# Patient Record
Sex: Female | Born: 1945 | Race: White | Hispanic: No | Marital: Married | Smoking: Never smoker
Health system: Southern US, Community
[De-identification: ages and names within clinical notes are randomized; demographics above are authoritative.]

## PROBLEM LIST (undated history)

## (undated) DIAGNOSIS — E039 Hypothyroidism, unspecified: Secondary | ICD-10-CM

## (undated) DIAGNOSIS — I10 Essential (primary) hypertension: Secondary | ICD-10-CM

## (undated) DIAGNOSIS — I471 Supraventricular tachycardia, unspecified: Secondary | ICD-10-CM

## (undated) DIAGNOSIS — R51 Headache: Secondary | ICD-10-CM

## (undated) DIAGNOSIS — J189 Pneumonia, unspecified organism: Secondary | ICD-10-CM

## (undated) DIAGNOSIS — M1990 Unspecified osteoarthritis, unspecified site: Secondary | ICD-10-CM

## (undated) DIAGNOSIS — E119 Type 2 diabetes mellitus without complications: Secondary | ICD-10-CM

## (undated) HISTORY — PX: CATARACT EXTRACTION: SUR2

## (undated) HISTORY — PX: TONSILLECTOMY: SUR1361

## (undated) HISTORY — PX: CHOLECYSTECTOMY: SHX55

---

## 1969-12-14 HISTORY — PX: TUBAL LIGATION: SHX77

## 2000-12-14 DIAGNOSIS — E1142 Type 2 diabetes mellitus with diabetic polyneuropathy: Secondary | ICD-10-CM | POA: Insufficient documentation

## 2011-12-17 ENCOUNTER — Other Ambulatory Visit: Payer: Self-pay

## 2011-12-17 ENCOUNTER — Emergency Department (INDEPENDENT_AMBULATORY_CARE_PROVIDER_SITE_OTHER): Payer: BC Managed Care – PPO

## 2011-12-17 ENCOUNTER — Encounter: Payer: Self-pay | Admitting: *Deleted

## 2011-12-17 ENCOUNTER — Emergency Department (HOSPITAL_BASED_OUTPATIENT_CLINIC_OR_DEPARTMENT_OTHER)
Admission: EM | Admit: 2011-12-17 | Discharge: 2011-12-17 | Disposition: A | Discharge status: Home / Self Care | Payer: BC Managed Care – PPO | Attending: Emergency Medicine | Admitting: Emergency Medicine

## 2011-12-17 DIAGNOSIS — I2510 Atherosclerotic heart disease of native coronary artery without angina pectoris: Secondary | ICD-10-CM | POA: Insufficient documentation

## 2011-12-17 DIAGNOSIS — I498 Other specified cardiac arrhythmias: Secondary | ICD-10-CM

## 2011-12-17 DIAGNOSIS — R002 Palpitations: Secondary | ICD-10-CM | POA: Insufficient documentation

## 2011-12-17 DIAGNOSIS — E119 Type 2 diabetes mellitus without complications: Secondary | ICD-10-CM | POA: Insufficient documentation

## 2011-12-17 DIAGNOSIS — I471 Supraventricular tachycardia: Secondary | ICD-10-CM

## 2011-12-17 LAB — COMPREHENSIVE METABOLIC PANEL
Albumin: 4.5 g/dL (ref 3.5–5.2)
BUN: 13 mg/dL (ref 6–23)
CO2: 25 mEq/L (ref 19–32)
Calcium: 10 mg/dL (ref 8.4–10.5)
Chloride: 96 mEq/L (ref 96–112)
Creatinine, Ser: 0.5 mg/dL (ref 0.50–1.10)
GFR calc non Af Amer: >90 mL/min (ref >90)
Total Bilirubin: 0.6 mg/dL (ref 0.3–1.2)

## 2011-12-17 LAB — URINALYSIS, ROUTINE W REFLEX MICROSCOPIC
Bilirubin Urine: NEGATIVE
Hgb urine dipstick: NEGATIVE
Protein, ur: NEGATIVE mg/dL
Urobilinogen, UA: 0.2 mg/dL (ref 0.0–1.0)

## 2011-12-17 LAB — CBC
HCT: 45.3 % (ref 36.0–46.0)
Hemoglobin: 16.2 g/dL — ABNORMAL HIGH (ref 12.0–15.0)
MCHC: 35.8 g/dL (ref 30.0–36.0)
MCV: 87.5 fL (ref 78.0–100.0)
RDW: 12.6 % (ref 11.5–15.5)

## 2011-12-17 LAB — DIFFERENTIAL
Basophils Relative: 0 % (ref 0–1)
Eosinophils Relative: 2 % (ref 0–5)
Monocytes Absolute: 0.4 10*3/uL (ref 0.1–1.0)
Monocytes Relative: 6 % (ref 3–12)
Neutro Abs: 4 10*3/uL (ref 1.7–7.7)

## 2011-12-17 LAB — PRO B NATRIURETIC PEPTIDE: Pro B Natriuretic peptide (BNP): <5 pg/mL (ref 0–125)

## 2011-12-17 LAB — GLUCOSE, CAPILLARY: Glucose-Capillary: 357 mg/dL — ABNORMAL HIGH (ref 70–99)

## 2011-12-17 LAB — TROPONIN I: Troponin I: <0.3 ng/mL (ref <0.30)

## 2011-12-17 LAB — URINE MICROSCOPIC-ADD ON

## 2011-12-17 LAB — LACTIC ACID, PLASMA: Lactic Acid, Venous: 1.9 mmol/L (ref 0.5–2.2)

## 2011-12-17 LAB — MAGNESIUM: Magnesium: 2 mg/dL (ref 1.5–2.5)

## 2011-12-17 MED ORDER — ASPIRIN 81 MG PO CHEW
CHEWABLE_TABLET | ORAL | Status: AC
  Administered 2011-12-17: 324 mg
  Filled 2011-12-17: qty 4

## 2011-12-17 MED ORDER — METOPROLOL TARTRATE 1 MG/ML IV SOLN
INTRAVENOUS | Status: AC
  Administered 2011-12-17: 5 mg via INTRAVASCULAR
  Filled 2011-12-17: qty 5

## 2011-12-17 MED ORDER — ADENOSINE 12 MG/4ML IV SOLN
12.0000 mg | Freq: Once | INTRAVENOUS | Status: AC
  Administered 2011-12-17: 12 mg via INTRAVENOUS
  Filled 2011-12-17: qty 4

## 2011-12-17 MED ORDER — ADENOSINE 6 MG/2ML IV SOLN
INTRAVENOUS | Status: AC
  Filled 2011-12-17: qty 4

## 2011-12-17 MED ORDER — ADENOSINE 6 MG/2ML IV SOLN
6.0000 mg | Freq: Once | INTRAVENOUS | Status: AC
  Administered 2011-12-17: 6 mg via INTRAVENOUS
  Filled 2011-12-17: qty 2

## 2011-12-17 MED ORDER — SODIUM CHLORIDE 0.9 % IV BOLUS (SEPSIS)
1000.0000 mL | Freq: Once | INTRAVENOUS | Status: AC
  Administered 2011-12-17: 1000 mL via INTRAVENOUS

## 2011-12-17 MED ORDER — ASPIRIN 325 MG PO TABS: 325.0000 mg | ORAL_TABLET | Freq: Once | ORAL | Status: DC

## 2011-12-17 MED ORDER — METOPROLOL SUCCINATE ER 25 MG PO TB24: 50.0000 mg | ORAL_TABLET | Freq: Every day | ORAL | Status: DC

## 2011-12-17 NOTE — ED Provider Notes (Addendum)
History     CSN: [id number]  Arrival date & time 12/17/11  1339   First MD Initiated Contact with Patient 12/17/11 1419      Chief Complaint  Patient presents with  . Tachycardia    (Consider location/radiation/quality/duration/timing/severity/associated sxs/prior treatment) HPI Patient is a 66 year old female with history of diabetes who does not followup with her regular physician. She presents today complaining of sensation of palpitations. She denies any chest pain or shortness of breath. She denies any nausea or vomiting. He should said that her symptoms began today at 1 PM. She has had this numerous times in the past but has never been evaluated for it. She last took medications for her diabetes one year ago. She denies any recent fevers, nausea, or vomiting. She's had no recent infectious symptoms including cough. Patient denies any blood in her stools or dark her stools. She has no history of anemia. Patient came in today as one of her coworkers convinced her that she should be evaluated given that her symptoms were persisting. There are no other associated or modifying factors. Past Medical History  Diagnosis Date  . Diabetes mellitus   . CAD (coronary artery disease)     No past surgical history on file.  No family history on file.  History  Substance Use Topics  . Smoking status: Not on file  . Smokeless tobacco: Not on file  . Alcohol Use:     OB History    Grav Para Term Preterm Abortions TAB SAB Ect Mult Living                  Review of Systems  Constitutional: Negative.   HENT: Negative.   Eyes: Negative.   Cardiovascular: Positive for palpitations.  Gastrointestinal: Negative.   Genitourinary: Positive for frequency.  Musculoskeletal: Negative.   Neurological: Negative.   Hematological: Negative.   Psychiatric/Behavioral: Negative.   All other systems reviewed and are negative.    Allergies  Penicillins  Home Medications  No current  outpatient prescriptions on file.  BP 116/83  Pulse 86  Temp(Src) 98.1 F (36.7 C) (Oral)  Resp 17  SpO2 98%  Physical Exam  Nursing note and vitals reviewed. Constitutional: She is oriented to person, place, and time. She appears well-developed and well-nourished. No distress.  HENT:  Head: Normocephalic and atraumatic.  Eyes: Conjunctivae and EOM are normal. Pupils are equal, round, and reactive to light.  Neck: Normal range of motion.  Cardiovascular: Regular rhythm, normal heart sounds and intact distal pulses.  Tachycardia present.  Exam reveals no gallop and no friction rub.   No murmur heard. Pulmonary/Chest: Effort normal and breath sounds normal. No respiratory distress. She has no wheezes. She has no rales.  Abdominal: Soft. Bowel sounds are normal. She exhibits no distension. There is no tenderness. There is no rebound and no guarding.  Musculoskeletal: Normal range of motion. She exhibits no edema.  Neurological: She is alert and oriented to person, place, and time. No cranial nerve deficit. She exhibits normal muscle tone. Coordination normal.  Skin: Skin is warm and dry. No rash noted.  Psychiatric: She has a normal mood and affect.    ED Course  Procedures (including critical care time)   #1 Date: 12/17/2011  Rate: 188  Rhythm: sinus tachycardia  QRS Axis: normal  Intervals: normal  ST/T Wave abnormalities: nonspecific ST changes  Conduction Disutrbances:none  Narrative Interpretation: possible slight ST elevation in aVR, 1 mm ST depression in I and aVL  Old EKG Reviewed: none available    #2 Date: 12/17/2011  Rate: 87  Rhythm: normal sinus rhythm  QRS Axis: normal  Intervals: normal  ST/T Wave abnormalities: nonspecific ST/T changes  Conduction Disutrbances:none  Narrative Interpretation: ST changes resolved and no longer apparent with slowed rate.  Old EKG Reviewed: none available and changes noted  Labs Reviewed  CBC - Abnormal; Notable for the  following:    RBC 5.18 (*)    Hemoglobin 16.2 (*)    Platelets 148 (*)    All other components within normal limits  COMPREHENSIVE METABOLIC PANEL - Abnormal; Notable for the following:    Sodium 134 (*)    Glucose, Bld 412 (*)    ALT 37 (*)    Alkaline Phosphatase 138 (*)    All other components within normal limits  URINALYSIS, ROUTINE W REFLEX MICROSCOPIC - Abnormal; Notable for the following:    Glucose, UA >1000 (*)    All other components within normal limits  GLUCOSE, CAPILLARY - Abnormal; Notable for the following:    Glucose-Capillary 398 (*)    All other components within normal limits  DIFFERENTIAL  PRO B NATRIURETIC PEPTIDE  MAGNESIUM  LACTIC ACID, PLASMA  TROPONIN I  URINE MICROSCOPIC-ADD ON  POCT CBG MONITORING  POCT CBG MONITORING  GLUCOSE, CAPILLARY   Dg Chest Port 1v Same Day  12/17/2011  *RADIOLOGY REPORT*  Clinical Data: SVT.  Sudden rapid heart rate.  Symptoms off and on for several years.  Fullness in the chest and throat.  PORTABLE CHEST - 1 VIEW SAME DAY  Comparison: None.  Findings: Cardiomediastinal silhouette is within minute normal. There are no focal consolidations or pleural effusions.  No pulmonary edema. Visualized osseous structures have a normal appearance.  IMPRESSION: Negative exam.  Original Report Authenticated By: Patterson Hammersmith, M.D.   CRITICAL CARE Performed by: Cyndra Numbers   Total critical care time: 30 minutes  Critical care time was exclusive of separately billable procedures and treating other patients.  Critical care was necessary to treat or prevent imminent or life-threatening deterioration.  Critical care was time spent personally by me on the following activities: development of treatment plan with patient and/or surrogate as well as nursing, discussions with consultants, evaluation of patient's response to treatment, examination of patient, obtaining history from patient or surrogate, ordering and performing treatments and  interventions, ordering and review of laboratory studies, ordering and review of radiographic studies, pulse oximetry and re-evaluation of patient's condition.  1. Supraventricular tachycardia       MDM  Patient was evaluated by myself. Upon arrival she was tachycardic to 188. Patient has no known history of SVT or A. fib with RVR. She had regular rhythm. Patient was maintaining her blood pressure. She was placed on monitor and tabs were placed. Patient absolutely denied any chest pain. Aspirin was given. Patient's initial EKG did have concerning appearance however this was with very rapid rate. I spoke with Dr. Sharyn Lull who was on call for cardiology. He was less concerned given the patient's rate. He did recommend slowing this rate which was our plan currently. Patient was given adenosine 6 mg followed by visiting 12 mg after vagal maneuvers failed initially. Patient's heart rate returned to though 100s with this. Laboratory workup showed a normal CBC with a renal panel remarkable only for glucose of 412. Patient did have slight elevation of her ALT and alkaline phosphatase. She denied any abdominal pain or chest pain whatsoever. Urinalysis showed glucosuria with it no  signs of urinary tract infection. Patient received a liter of normal saline IV bolus. She also received Lopressor 5 mg IV. Following this patient's glucose was down to 357. She preferred not to be started on any medications for this today. She wanted to followup with her primary care doctor. A second troponin was performed at 5:30. Patient was told that if this returned negative she can be discharged. I discussed the patient with Dr. Sharyn Lull. He will see her in clinic tomorrow. He did request that she start on Toprol 50 mg. A prescription for this was provided. Care will be signed out to Dr. Hyman Hopes at this time. If troponin is negative patient will be discharged home in good condition with instructions to followup with Dr. Sharyn Lull  tomorrow.        Cyndra Numbers, MD 12/17/11 1724  Cyndra Numbers, MD 12/18/11 [phone]

## 2011-12-17 NOTE — ED Notes (Signed)
Patient was cardioverted with adenosine.  Adenosine 6 mg given IV with rapid 10 cc ns push.  EKG monitor showed no change in HR of 180.  Repeat adenosine 12mg  given with rapid 10cc ns push.  EKG showed HR decreased to 124 and then to 85, NSR.  Patient tolerated well.  Pt was on zole monitor, dr hunt was present.  Pt states she is breathing better and does not have the pressure in her chest or jaw.

## 2011-12-17 NOTE — ED Provider Notes (Signed)
BP 116/83  Pulse 86  Temp(Src) 98.1 F (36.7 C) (Oral)  Resp 17  SpO2 98%  Assumption of care from Dr. Hedwig Morton troponin is negative. The patient appears well. Her heart rate is in the 80s. She denies chest pain, shortness of breath. She's comfortable with the plan for discharge home. Will discharge patient home to follow with Dr. Sharyn Lull as previously discussed by the patient, Dr. Alto Denver and Dr. Sharyn Lull.  Forbes Cellar, MD 12/17/11 1806

## 2011-12-17 NOTE — ED Notes (Signed)
Patient states she developed a sudden onset of a rapid heart rate.  States she has had the same symptoms off and on for several years.  States today she is having a fulfullness in her chest and throat.  States she has just ignored her rapid heart rate in the past and is "self treating " her diabetes, took herself off of insulin.

## 2011-12-17 NOTE — ED Notes (Signed)
Per Dr. Alto Denver, repeat cbg when bolus finishes and recheck troponin at 1730hrs.

## 2011-12-21 ENCOUNTER — Other Ambulatory Visit (HOSPITAL_COMMUNITY): Payer: Self-pay | Admitting: Cardiology

## 2011-12-23 ENCOUNTER — Ambulatory Visit (HOSPITAL_COMMUNITY): Payer: BC Managed Care – PPO

## 2011-12-23 ENCOUNTER — Ambulatory Visit (HOSPITAL_COMMUNITY)
Admission: RE | Admit: 2011-12-23 | Discharge: 2011-12-23 | Payer: BC Managed Care – PPO | Source: Ambulatory Visit | Attending: Cardiology | Admitting: Cardiology

## 2011-12-23 ENCOUNTER — Encounter (HOSPITAL_COMMUNITY)
Admission: RE | Admit: 2011-12-23 | Discharge: 2011-12-23 | Disposition: A | Discharge status: Home / Self Care | Payer: BC Managed Care – PPO | Source: Ambulatory Visit | Attending: Cardiology | Admitting: Cardiology

## 2011-12-23 DIAGNOSIS — I10 Essential (primary) hypertension: Secondary | ICD-10-CM | POA: Insufficient documentation

## 2011-12-23 DIAGNOSIS — E119 Type 2 diabetes mellitus without complications: Secondary | ICD-10-CM | POA: Insufficient documentation

## 2011-12-23 DIAGNOSIS — R002 Palpitations: Secondary | ICD-10-CM | POA: Insufficient documentation

## 2011-12-23 DIAGNOSIS — R9439 Abnormal result of other cardiovascular function study: Secondary | ICD-10-CM | POA: Insufficient documentation

## 2011-12-23 DIAGNOSIS — R079 Chest pain, unspecified: Secondary | ICD-10-CM | POA: Insufficient documentation

## 2011-12-23 DIAGNOSIS — I4949 Other premature depolarization: Secondary | ICD-10-CM | POA: Insufficient documentation

## 2011-12-23 DIAGNOSIS — R0602 Shortness of breath: Secondary | ICD-10-CM | POA: Insufficient documentation

## 2011-12-23 LAB — LIPID PANEL
Cholesterol: 228 mg/dL — ABNORMAL HIGH (ref 0–200)
Total CHOL/HDL Ratio: 7.4 RATIO
Triglycerides: 572 mg/dL — ABNORMAL HIGH (ref <150)
VLDL: UNDETERMINED mg/dL (ref 0–40)

## 2011-12-23 LAB — BASIC METABOLIC PANEL
BUN: 12 mg/dL (ref 6–23)
Calcium: 9.7 mg/dL (ref 8.4–10.5)
GFR calc Af Amer: >90 mL/min (ref >90)
GFR calc non Af Amer: >90 mL/min (ref >90)
Potassium: 4.2 mEq/L (ref 3.5–5.1)
Sodium: 136 mEq/L (ref 135–145)

## 2011-12-23 LAB — HEPATIC FUNCTION PANEL
AST: 19 U/L (ref 0–37)
Alkaline Phosphatase: 127 U/L — ABNORMAL HIGH (ref 39–117)
Bilirubin, Direct: 0.2 mg/dL (ref 0.0–0.3)
Total Bilirubin: 0.7 mg/dL (ref 0.3–1.2)

## 2011-12-23 MED ORDER — TECHNETIUM TC 99M TETROFOSMIN IV KIT
30.0000 | PACK | Freq: Once | INTRAVENOUS | Status: AC | PRN
  Administered 2011-12-23: 30 via INTRAVENOUS

## 2011-12-23 MED ORDER — REGADENOSON 0.4 MG/5ML IV SOLN
INTRAVENOUS | Status: AC
  Filled 2011-12-23: qty 5

## 2011-12-23 MED ORDER — ACETAMINOPHEN 325 MG PO TABS
ORAL_TABLET | ORAL | Status: AC
  Filled 2011-12-23: qty 2

## 2011-12-23 MED ORDER — REGADENOSON 0.4 MG/5ML IV SOLN
0.4000 mg | Freq: Once | INTRAVENOUS | Status: AC
  Administered 2011-12-23: 0.4 mg via INTRAVENOUS

## 2011-12-23 MED ORDER — ACETAMINOPHEN 325 MG PO TABS
650.0000 mg | ORAL_TABLET | Freq: Four times a day (QID) | ORAL | Status: AC | PRN
Start: 2011-12-23 — End: 2011-12-23
  Administered 2011-12-23: 650 mg via ORAL

## 2011-12-23 MED ORDER — TECHNETIUM TC 99M TETROFOSMIN IV KIT
10.0000 | PACK | Freq: Once | INTRAVENOUS | Status: AC | PRN
  Administered 2011-12-23: 10 via INTRAVENOUS

## 2012-11-03 ENCOUNTER — Emergency Department (HOSPITAL_COMMUNITY): Payer: BC Managed Care – PPO

## 2012-11-03 ENCOUNTER — Encounter (HOSPITAL_COMMUNITY): Payer: Self-pay | Admitting: Emergency Medicine

## 2012-11-03 ENCOUNTER — Emergency Department (HOSPITAL_COMMUNITY)
Admission: EM | Admit: 2012-11-03 | Discharge: 2012-11-03 | Disposition: A | Discharge status: Home / Self Care | Payer: BC Managed Care – PPO | Attending: Emergency Medicine | Admitting: Emergency Medicine

## 2012-11-03 DIAGNOSIS — I471 Supraventricular tachycardia, unspecified: Secondary | ICD-10-CM | POA: Insufficient documentation

## 2012-11-03 DIAGNOSIS — R112 Nausea with vomiting, unspecified: Secondary | ICD-10-CM | POA: Insufficient documentation

## 2012-11-03 DIAGNOSIS — I2510 Atherosclerotic heart disease of native coronary artery without angina pectoris: Secondary | ICD-10-CM | POA: Insufficient documentation

## 2012-11-03 DIAGNOSIS — I10 Essential (primary) hypertension: Secondary | ICD-10-CM | POA: Insufficient documentation

## 2012-11-03 DIAGNOSIS — R0602 Shortness of breath: Secondary | ICD-10-CM | POA: Insufficient documentation

## 2012-11-03 DIAGNOSIS — E119 Type 2 diabetes mellitus without complications: Secondary | ICD-10-CM | POA: Insufficient documentation

## 2012-11-03 DIAGNOSIS — R002 Palpitations: Secondary | ICD-10-CM | POA: Insufficient documentation

## 2012-11-03 HISTORY — DX: Essential (primary) hypertension: I10

## 2012-11-03 LAB — BASIC METABOLIC PANEL
CO2: 25 mEq/L (ref 19–32)
Calcium: 9.3 mg/dL (ref 8.4–10.5)
Creatinine, Ser: 0.47 mg/dL — ABNORMAL LOW (ref 0.50–1.10)
GFR calc non Af Amer: >90 mL/min (ref >90)
Glucose, Bld: 266 mg/dL — ABNORMAL HIGH (ref 70–99)
Sodium: 137 mEq/L (ref 135–145)

## 2012-11-03 LAB — PHOSPHORUS: Phosphorus: 2.7 mg/dL (ref 2.3–4.6)

## 2012-11-03 MED ORDER — METOPROLOL SUCCINATE ER 50 MG PO TB24: 50.0000 mg | ORAL_TABLET | Freq: Every day | ORAL | Status: DC

## 2012-11-03 NOTE — ED Provider Notes (Signed)
History     CSN: [id number]  Arrival date & time 11/03/12  4098   First MD Initiated Contact with Patient 11/03/12 1813      Chief Complaint  Patient presents with  . Tachycardia    (Consider location/radiation/quality/duration/timing/severity/associated sxs/prior treatment) HPI: [name] is a 66 yo CF with history of IDDM, PSVT and HTN who presents after recurrent episode of tachycardia. She was in her normal state of health until earlier this evening when she developed palpitations. This was associated with mild SOB, diaphoresis and nausea. She took her pulse and it was close to 200. EMS arrived and gave her 6 mg of adenosine with conversion to SR. At that time her symptoms including SOB, diaphoresis and nausea resolved. Upon arrival to the ED she was asymptomatic. She did have one episode of emesis enroute which was treated with Zofran.   Past Medical History  Diagnosis Date  . Diabetes mellitus   . CAD (coronary artery disease)   . Tachycardia   . Hypertension     Past Surgical History  Procedure Date  . Cholecystectomy     No family history on file.  History  Substance Use Topics  . Smoking status: Never Smoker   . Smokeless tobacco: Never Used  . Alcohol Use: No    OB History    Grav Para Term Preterm Abortions TAB SAB Ect Mult Living                  Review of Systems  Constitutional: Negative for fever, chills, activity change and appetite change.  Eyes: Negative for photophobia and visual disturbance.  Respiratory: Positive for shortness of breath. Negative for cough and chest tightness.   Cardiovascular: Positive for palpitations. Negative for chest pain.  Gastrointestinal: Positive for nausea and vomiting. Negative for abdominal pain and diarrhea.  Genitourinary: Negative for dysuria, decreased urine volume and difficulty urinating.  Musculoskeletal: Negative for myalgias and arthralgias.  Skin: Negative for pallor and rash.  Neurological: Negative  for dizziness, syncope and light-headedness.  Psychiatric/Behavioral: Negative for confusion and agitation.    Allergies  Penicillins  Home Medications   Current Outpatient Rx  Name  Route  Sig  Dispense  Refill  . ACETAMINOPHEN 500 MG PO TABS   Oral   Take 2,000 mg by mouth 2 (two) times daily as needed. For pain         . IBUPROFEN 200 MG PO TABS   Oral   Take 800 mg by mouth 2 (two) times daily as needed. For pain           BP 124/92  Pulse 90  Temp 98.5 F (36.9 C) (Oral)  Resp 17  SpO2 98%  Physical Exam  Nursing note and vitals reviewed. Constitutional: She is oriented to person, place, and time. She appears well-developed and well-nourished.  HENT:  Head: Normocephalic and atraumatic.  Eyes: EOM are normal. Pupils are equal, round, and reactive to light.  Neck: Normal range of motion. Neck supple.  Cardiovascular: Normal rate, regular rhythm and normal heart sounds.   Pulmonary/Chest: Effort normal and breath sounds normal.  Abdominal: Soft. Bowel sounds are normal.  Musculoskeletal: Normal range of motion. She exhibits no edema.  Neurological: She is alert and oriented to person, place, and time. She has normal reflexes. No cranial nerve deficit.  Skin: Skin is warm and dry.    ED Course  Procedures (including critical care time)  Labs Reviewed  BASIC METABOLIC PANEL - Abnormal; Notable for  the following:    Glucose, Bld 266 (*)     Creatinine, Ser 0.47 (*)     All other components within normal limits  MAGNESIUM  PHOSPHORUS   Dg Chest Portable 1 View  11/03/2012  *RADIOLOGY REPORT*  Clinical Data: History of PSVT, cough  PORTABLE CHEST - 1 VIEW  Comparison: 12/17/2011  Findings:  Grossly unchanged cardiac silhouette and mediastinal contours. There is unchanged mild diffuse thickening of the pulmonary interstitium.  Mild elevation of right hemidiaphragm.  No new focal airspace opacities.  No definite evidence of pulmonary edema.  No definite pleural  effusion or pneumothorax.  Unchanged bones.  IMPRESSION: Unchanged mild bronchitic change without acute cardiopulmonary disease on this AP portable examination.   Original Report Authenticated By: Tacey Ruiz, MD    No diagnosis found.  MDM  66 yo CF with history of PSVT, IDDM, HTN presents with recurrent SVT. Afebrile, vital signs stable. She is asymptomatic on arrival. Doubt ACS as she denies CP, no ongoing ischemia on EKG. Q waves noted in leads III and aVF which were present on previous EKG. No WPW, prolonged Qtc or Brugada pattern. Obtained electrolytes to evaluate for potential causes of SVT which were all normal. No urinary symptoms to warrant urinalysis. Patient's glucose 266 but she is non-complaint with insulin. CXR without evidence of acute cardiopulmonary disease. Treated with IVF's. Discussed case with Dr. Patria Mane who recommended restarting Toprol 50 mg daily and he will see her in clinic on Monday. Felt patient was stable for outpatient management as she is asymptomatic, no worrisome findings on w/u or exam, has close outpatient f/u. Return precautions to include trouble breathing, CP, recurrent episode, syncope were given. Patient and her husband in agreement with plan. Rx for Toprol given.   Reviewed imaging, labs and previous medical records and utilized in MDM  Discussed case with Dr. Jeraldine Loots.   Clinical Impression 1. Recurrent PSVT 2. Hyperglycemia        Margie Billet, MD 11/04/12 0100

## 2012-11-03 NOTE — ED Notes (Signed)
Patient from home via [facility] c/o of palpitations and tachycardia. Paramedic reports patient was in SVT rhythm on assessment.  6mg  of adenosine was given and heart rate improved to sinus tachycardia.  4mg  of Zofran was also given on the truck.  Patient denies feeling palpitations at this time.  Patient states "I feel flushed, sweaty, nauseous, and have a headache".

## 2012-11-07 NOTE — ED Provider Notes (Signed)
  I performed a history and physical examination of Sophia Cox and discussed her management with Dr. Freida Busman.  I agree with the history, physical, assessment, and plan of care, with the following exceptions: None  On my exam the patient was in no distress.  The patient's supraventricular tachycardia had resolved.  We discussed her care with her cardiologist, who advised to initiate medication.  Absent distress, recurrence of her dysrhythmia, she was discharged in stable condition.  I saw the ECG, relevant labs and studies - I agree with the interpretation.   Elyse Jarvis, MD 11/07/12 [id number]

## 2013-04-07 ENCOUNTER — Emergency Department (HOSPITAL_COMMUNITY)
Admission: EM | Admit: 2013-04-07 | Discharge: 2013-04-07 | Disposition: A | Discharge status: Home / Self Care | Payer: BC Managed Care – PPO | Attending: Emergency Medicine | Admitting: Emergency Medicine

## 2013-04-07 ENCOUNTER — Encounter (HOSPITAL_COMMUNITY): Payer: Self-pay | Admitting: *Deleted

## 2013-04-07 DIAGNOSIS — E1169 Type 2 diabetes mellitus with other specified complication: Secondary | ICD-10-CM | POA: Insufficient documentation

## 2013-04-07 DIAGNOSIS — I471 Supraventricular tachycardia, unspecified: Secondary | ICD-10-CM

## 2013-04-07 DIAGNOSIS — I498 Other specified cardiac arrhythmias: Secondary | ICD-10-CM | POA: Insufficient documentation

## 2013-04-07 DIAGNOSIS — R739 Hyperglycemia, unspecified: Secondary | ICD-10-CM

## 2013-04-07 DIAGNOSIS — Z79899 Other long term (current) drug therapy: Secondary | ICD-10-CM | POA: Insufficient documentation

## 2013-04-07 DIAGNOSIS — I10 Essential (primary) hypertension: Secondary | ICD-10-CM | POA: Insufficient documentation

## 2013-04-07 DIAGNOSIS — Z880 Allergy status to penicillin: Secondary | ICD-10-CM | POA: Insufficient documentation

## 2013-04-07 DIAGNOSIS — R0789 Other chest pain: Secondary | ICD-10-CM | POA: Insufficient documentation

## 2013-04-07 LAB — POCT I-STAT, CHEM 8
BUN: 18 mg/dL (ref 6–23)
Calcium, Ion: 1.25 mmol/L (ref 1.13–1.30)
Chloride: 103 mEq/L (ref 96–112)
Creatinine, Ser: 0.6 mg/dL (ref 0.50–1.10)
Glucose, Bld: 444 mg/dL — ABNORMAL HIGH (ref 70–99)
HCT: 47 % — ABNORMAL HIGH (ref 36.0–46.0)
Hemoglobin: 16 g/dL — ABNORMAL HIGH (ref 12.0–15.0)
Potassium: 3.7 meq/L (ref 3.5–5.1)
Sodium: 137 meq/L (ref 135–145)
TCO2: 25 mmol/L (ref 0–100)

## 2013-04-07 MED ORDER — ADENOSINE 6 MG/2ML IV SOLN
6.0000 mg | Freq: Once | INTRAVENOUS | Status: AC
  Administered 2013-04-07: 6 mg via INTRAVENOUS

## 2013-04-07 MED ORDER — ADENOSINE 6 MG/2ML IV SOLN
INTRAVENOUS | Status: AC
  Administered 2013-04-07: 6 mg via INTRAVENOUS
  Filled 2013-04-07: qty 8

## 2013-04-07 MED ORDER — METFORMIN HCL 500 MG PO TABS: 500.0000 mg | ORAL_TABLET | Freq: Every day | ORAL | Status: DC

## 2013-04-07 MED ORDER — SODIUM CHLORIDE 0.9 % IV BOLUS (SEPSIS)
1000.0000 mL | Freq: Once | INTRAVENOUS | Status: AC
  Administered 2013-04-07: 1000 mL via INTRAVENOUS

## 2013-04-07 NOTE — ED Notes (Signed)
Pt with hx of "fast hear rate" to ED c/o at 0630 this am.  HR reading 195 presently.  Pt c/o sob and blurred vision, sob and chest pain. States last time they "stopped my heart with medicine".

## 2013-04-07 NOTE — ED Notes (Signed)
Cardiologist last seen [facility] MD

## 2013-04-07 NOTE — Discharge Instructions (Signed)
Supraventricular Tachycardia °Supraventricular tachycardia (SVT) is an abnormal heart rhythm (arrhythmia) that causes the heart to beat very fast (tachycardia). This kind of fast heartbeat originates in the upper chambers of the heart (atria). SVT can cause the heart to beat greater than 100 beats per minute. SVT can have a rapid burst of heartbeats. This can start and stop suddenly without warning and is called nonsustained. SVT can also be sustained, in which the heart beats at a continuous fast rate.  °CAUSES  °There can be different causes of SVT. Some of these include: °· Heart valve problems such as mitral valve prolapse. °· An enlarged heart (hypertrophic cardiomyopathy). °· Congenital heart problems. °· Heart inflammation (pericarditis). °· Hyperthyroidism. °· Low potassium or magnesium levels. °· Caffeine. °· Drug use such as cocaine, methamphetamines, or stimulants. °· Some over-the-counter medicines such as: °· Decongestants. °· Diet medicines. °· Herbal medicines. °SYMPTOMS  °Symptoms of SVT can vary. Symptoms depend on whether the SVT is sustained or nonsustained. You may experience: °· No symptoms (asymptomatic). °· An awareness of your heart beating rapidly (palpitations). °· Shortness of breath. °· Chest pain or pressure. °If your blood pressure drops because of the SVT, you may experience: °· Fainting or near fainting. °· Weakness. °· Dizziness. °DIAGNOSIS  °Different tests can be performed to diagnose SVT, such as: °· An electrocardiogram (EKG). This is a painless test that records the electrical activity of your heart. °· Holter monitor. This is a 24 hour recording of your heart rhythm. You will be given a diary. Write down all symptoms that you have and what you were doing at the time you experienced symptoms. °· Arrhythmia monitor. This is a small device that your wear for several weeks. It records the heart rhythm when you have symptoms. °· Echocardiogram. This is an imaging test to help detect  abnormal heart structure such as congenital abnormalities, heart valve problems, or heart enlargement. °· Stress test. This test can help determine if the SVT is related to exercise. °· Electrophysiology study (EPS). This is a procedure that evaluates your heart's electrical system and can help your caregiver find the cause of your SVT. °TREATMENT  °Treatment of SVT depends on the symptoms, how often it recurs, and whether there are any underlying heart problems.  °· If symptoms are rare and no other cardiac disease is present, no treatment may be needed. °· Blood work may be done to check potassium, magnesium, and thyroid hormone levels to see if they are abnormal. If these levels are abnormal, treatment to correct the problems will occur. °Medicines °Your caregiver may use oral medicines to treat SVT. These medicines are given for long-term control of SVT. Medicines may be used alone or in combination with other treatments. These medicines work to slow nerve impulses in the heart muscle. These medicines can also be used to treat high blood pressure. Some of these medicines may include: °· Calcium channel blockers. °· Beta blockers. °· Digoxin. °Nonsurgical procedures °Nonsurgical techniques may be used if oral medicines do not work. Some examples include: °· Cardioversion. This technique uses either drugs or an electrical shock to restore a normal heart rhythm. °· Cardioversion drugs may be given through an intravenous (IV) line to help "reset" the heart rhythm. °· In electrical cardioversion, the caregiver shocks your heart to stop its beat for a split second. This helps to reset the heart to a normal rhythm. °· Ablation. This procedure is done under mild sedation. High frequency radio wave energy is used to   destroy the area of heart tissue responsible for the SVT. HOME CARE INSTRUCTIONS   Do not smoke.  Only take medicines prescribed by your caregiver. Check with your caregiver before using over-the-counter  medicines.  Check with your caregiver about how much alcohol and caffeine (coffee, tea, colas, or chocolate) you may have.  It is very important to keep all follow-up referrals and appointments in order to properly manage this problem. SEEK IMMEDIATE MEDICAL CARE IF:  You have dizziness.  You faint or nearly faint.  You have shortness of breath.  You have chest pain or pressure.  You have sudden nausea or vomiting.  You have profuse sweating.  You are concerned about how long your symptoms last.  You are concerned about the frequency of your SVT episodes. If you have the above symptoms, call your local emergency services (911 in [place] immediately. Do not drive yourself to the hospital. MAKE SURE YOU:   Understand these instructions.  Will watch your condition.  Will get help right away if you are not doing well or get worse. Document Released: 11/30/2005 Document Revised: 02/22/2012 Document Reviewed: 03/14/2009 [facility] Patient Information 2013 [place], [place].    Electrophysiology Study An electrophysiology (EP) study is a minimally invasive heart test (a test that involves a small skin incision) that evaluates the electrical conduction system of your heart.  SYMPTOMS  An EP study is done if other non-invasive heart tests (tests that do not make skin incisions) have not found the cause of unexplained symptoms such as:  Dizziness or fainting.  A fast heart beat (tachycardia).  A slow heart beat (bradycardia). BEFORE THE EP STUDY  Your caregiver will explain how an EP study is done. If you have any questions about the EP study, be sure to ask your caregiver.  Do not eat or drink before the EP study as instructed by your caregiver.  Be sure to urinate before the EP study.  Let your caregiver know about any allergies you have. This includes allergies to food, medicine or latex products.  Tell your caregiver about all prescription medicine (especially blood  thinners), over the counter and herbal medications you take.  Let your caregiver know if you have a history of bleeding problems.  If you are going home after the EP study, someone will need to drive you home and stay with you for 24 hours. Do not make major decisions for 24 hours after the EP study. EP STUDY   An EP study is performed in a cath lab room. This is a room set up to do heart procedures.  An intravenous (IV) line will be put in your arm. Medication will be given through the IV to help you relax.  Your groin will be shaved and cleansed with an antibacterial cleanser. Sterile drapes will cover you. This will keep the groin area sterile.  A medication will be injected into your groin area to numb it. Then, a thin, flexible tube (catheter) with an electrode tip will be inserted into a large vein (femoral vein) in your groin. From there, the catheter is guided to the heart using a special type of X-ray machine (fluoroscopy). Once in the heart, the catheter will help evaluate the electrical activity of your heart.  During the EP study:  You may feel dizzy or lightheaded.  Your heart rate may temporarily increase or you may feel your heart beating hard.  Tell your caregiver if you feel dizzy, nauseated, or have chest pain or pressure during the  EP study. AFTER THE EP STUDY  When the EP study is done, the catheter is removed.  Firm pressure is applied to the groin insertion site. This is done to help the insertion site clot.  You will need to lie flat for a few hours or as told by your caregiver. You will need to keep your legs straight. Do not bend or cross your legs. This is done so the clot at the insertion does not break loose and start bleeding.  If you took blood thinners before the EP study, ask your caregiver when you can start taking them again. EP STUDY FINDINGS If the cause of your symptom(s) is found during the EP study, your caregiver will discuss treatment options.  Some treatment options that may be done to correct your symptoms can include:  Ablation. An ablation is a source of energy that destroys a small area of heart tissue that may be causing a fast heart rate (tachycardia).  Implantable cardioverter defibrillator(ICD). An ICD can detect a fast or abnormal heart rate. When an abnormal rhythm is detected, the ICD shocks the heart to restore it to a normal heart rhythm. RISKS AND COMPLICATIONS Complications can occur during an EP study. Possible complications include:  A fast heart rate (tachycardia) that does not go away. This may require shocking your heart (cardioversion).  Bleeding or bruising from the catheter insertion site.  Temporary heart rhythm abnormalities.  Temporary changes in blood pressure.  Puncture (perforation) of the heart wall. This can cause bleeding between the heart and the sac that surrounds it (cardiac tamponade). This is a life-threatening condition which may require open heart surgery.  Possible cardiac arrest or fatal heart arrhythmia.  Infection at the groin insertion site. SEEK IMMEDIATE MEDICAL CARE IF:   You have bleeding from the groin insertion site.  You develop a hard spot at the groin insertion site. This could be a clot (hematoma) at the insertion site.  You have a large amount of bruising that expands from the groin insertion site.  You develop chest pain or have "heaviness" in your chest.  You develop nausea or vomiting.  You become dizzy or feel faint. Document Released: 05/20/2010 Document Revised: 02/22/2012 Document Reviewed: 05/20/2010 [facility] Patient Information 2013 [place], [place].

## 2013-04-07 NOTE — ED Provider Notes (Signed)
History     CSN: [id number]  Arrival date & time 04/07/13  1157   First MD Initiated Contact with Patient 04/07/13 1222      Chief Complaint  Patient presents with  . Tachycardia    (Consider location/radiation/quality/duration/timing/severity/associated sxs/prior treatment) HPI Comments: Patient reports prior history of tachycardia. She reports no significant past medical history, does not have a doctor and does not like to take medications. Old records indicate that she has a history of diabetes, hypertension, tachycardia. Patient reports she had her first episode of the same, tachycardia about a year and a half ago, was followed up by Dr. Sharyn Lull, he did an echo and a stress test and was told it was normal. She did not followup with him after her second episode about 6 months ago. Each time she was seen in the emergency department, given medication to resolve her abnormal tachycardia with improvement. She reports she drinks 2 cups of coffee daily, did not have any yet today. Otherwise she had been in her usual state of health. She was at work, possibly under some normal stressors. She denies any coughing, cold symptoms. She denies drinking alcohol significantly, no drug use. She denies a significant family history of cardiac arrhythmias. She reports a family member with ischemic heart disease in the past. Patient denies smoking. No specific medications were taken for her symptoms today. She reports she did have some transient mild chest discomfort that she did not describe his pain. She denies any sweats, fevers. She denies any nausea or vomiting. She did have some mild associated shortness of breath. Level 5 caveat due to patient acuity.  The history is provided by the patient, medical records and a relative.    Past Medical History  Diagnosis Date  . Diabetes mellitus   . Tachycardia   . Hypertension     Past Surgical History  Procedure Laterality Date  . Cholecystectomy       History reviewed. No pertinent family history.  History  Substance Use Topics  . Smoking status: Never Smoker   . Smokeless tobacco: Never Used  . Alcohol Use: No    OB History   Grav Para Term Preterm Abortions TAB SAB Ect Mult Living                  Review of Systems  Unable to perform ROS: Acuity of condition    Allergies  Eggs or egg-derived products and Penicillins  Home Medications   Current Outpatient Rx  Name  Route  Sig  Dispense  Refill  . acetaminophen (TYLENOL) 500 MG tablet   Oral   Take 2,000 mg by mouth 2 (two) times daily as needed for pain or fever. For pain         . ibuprofen (ADVIL,MOTRIN) 200 MG tablet   Oral   Take 400 mg by mouth 2 (two) times daily as needed. For pain         . metFORMIN (GLUCOPHAGE) 500 MG tablet   Oral   Take 1 tablet (500 mg total) by mouth daily.   20 tablet   0     BP 126/84  Pulse 104  Temp(Src) 97.6 F (36.4 C)  Resp 18  Ht 5' 5.5" (1.664 m)  Wt 196 lb (88.905 kg)  BMI 32.11 kg/m2  SpO2 99%  Physical Exam  Nursing note and vitals reviewed. Constitutional: She is oriented to person, place, and time. She appears well-developed and well-nourished. No distress.  HENT:  Head:  Normocephalic.  Eyes: Conjunctivae and EOM are normal. No scleral icterus.  Neck: Normal range of motion. Neck supple. No JVD present. No tracheal deviation present.  Cardiovascular: Regular rhythm and intact distal pulses.  Tachycardia present.   No murmur heard. Pulmonary/Chest: Effort normal.  Abdominal: She exhibits no distension. There is no tenderness.  Neurological: She is alert and oriented to person, place, and time. No cranial nerve deficit. She exhibits normal muscle tone. Coordination normal. GCS eye subscore is 4. GCS verbal subscore is 5. GCS motor subscore is 6.  Skin: Skin is warm. She is not diaphoretic.  Psychiatric: She has a normal mood and affect.    ED Course  CARDIOVERSION Date/Time: 04/07/2013 12:32  PM Performed by: Lear Ng Authorized by: Lear Ng Consent: Verbal consent obtained. The procedure was performed in an emergent situation. Risks and benefits: risks, benefits and alternatives were discussed Consent given by: patient Patient understanding: patient states understanding of the procedure being performed Patient consent: the patient's understanding of the procedure matches consent given Patient identity confirmed: verbally with patient Time out: Immediately prior to procedure a "time out" was called to verify the correct patient, procedure, equipment, support staff and site/side marked as required. Patient sedated: no Cardioversion basis: emergent Pre-procedure rhythm: supraventricular tachycardia Number of attempts: 1 Post-procedure rhythm: normal sinus rhythm Complications: no complications Patient tolerance: Patient tolerated the procedure well with no immediate complications. Comments: Cardioversion with adenosine   (including critical care time)    Labs Reviewed  POCT I-STAT, CHEM 8 - Abnormal; Notable for the following:    Glucose, Bld 444 (*)    Hemoglobin 16.0 (*)    HCT 47.0 (*)    All other components within normal limits   No results found.   1. SVT (supraventricular tachycardia)   2. Hyperglycemia without ketosis     ra sat is 94% and I interpret to be adequate  12:58 PM  pt remains feeling improved.  Will check lytes and discuss with cardiology to set up outpt appt.     1:15 PM Lytes are WNL except glucose is >400. this is due to combo of non compliance and also stress from being in SVT with rapid tachycardia.  Pt again encouraged to see a PCP and also will give IVF bolus here.     Initial ECG showed SVT at rate 188, non specific ST and T wave abn's, likely rate related.  Normal axis.     ECG at time 13:06 shows SR at rate 94, borderline LAD, normal intervals, no ST or T wave abn's.      1:46 PM Spoke to the [facility]  cardiology Master agrees patient can contact office for followup for possible EP evaluation. I wrote for metformin for pt to start for hyperglycemia and referred to [facility] outpt clinics as well.   MDM   Pt was cardioverted with adenosine from SVT to sinus tachycardia, now sinus rhythm.  This is 3rd episode in 1.5 years.  Pt was following Dr. Sharyn Lull, has been non comlpiant, no PCP, has h/o DM, CAD, HTN currently on no medications.          Gavin Pound. Sebastiano Luecke, MD 04/07/13 1346

## 2013-04-19 ENCOUNTER — Telehealth: Payer: Self-pay | Admitting: Dietician

## 2013-04-20 NOTE — Telephone Encounter (Signed)
Spoke with patient to welcome her to our practice. confirmed day and time of upcoming appointment. Also asked her to check blood sugar before meals three times a day for 1-2 days prior to appointment and to bring her meter with her to the appointment.

## 2013-04-26 ENCOUNTER — Ambulatory Visit (INDEPENDENT_AMBULATORY_CARE_PROVIDER_SITE_OTHER): Payer: BC Managed Care – PPO | Admitting: Internal Medicine

## 2013-04-26 ENCOUNTER — Encounter: Payer: Self-pay | Admitting: Internal Medicine

## 2013-04-26 VITALS — BP 158/93 | HR 89 | Ht 65.5 in | Wt 196.0 lb

## 2013-04-26 DIAGNOSIS — E119 Type 2 diabetes mellitus without complications: Secondary | ICD-10-CM

## 2013-04-26 DIAGNOSIS — I10 Essential (primary) hypertension: Secondary | ICD-10-CM

## 2013-04-26 DIAGNOSIS — I498 Other specified cardiac arrhythmias: Secondary | ICD-10-CM

## 2013-04-26 DIAGNOSIS — E1159 Type 2 diabetes mellitus with other circulatory complications: Secondary | ICD-10-CM | POA: Insufficient documentation

## 2013-04-26 DIAGNOSIS — R9431 Abnormal electrocardiogram [ECG] [EKG]: Secondary | ICD-10-CM

## 2013-04-26 DIAGNOSIS — I471 Supraventricular tachycardia: Secondary | ICD-10-CM

## 2013-04-26 NOTE — Assessment & Plan Note (Signed)
Evidence of inferior Q waves suggesting prior myocardial infarction is not supported by a Myoview scan demonstrating normal perfusion

## 2013-04-26 NOTE — Assessment & Plan Note (Signed)
The patient has edematous and responsive SVT. There is a pseudo-R. Prime in lead 2  And perhaps widening of the Q wave in lead 3 which was her symptoms would suggest AV neuro injury. However, they're also suggestions of a negative P wave in the inferior leads with a long RP configuration and i canT tell whether this is a function of the T-wave or the retrograde P waves  We have discussed catheter ablation. This has included the benefits as well as risks including but not limited to death perforation and heart block requiring pacemaker implantation.  Will anticipate an echocardiogram prior to the procedure is SVT can be associated with valvular abnormalities.Marland Kitchen

## 2013-04-26 NOTE — Assessment & Plan Note (Signed)
Blood pressure is elevated. I recommended that she begin on an ACE inhibitor. Both her renal protection as well as blood pressure. She also snores. She had a sleep study. Medical recommendations will be deferred to her diabetes clinic appointment next week

## 2013-04-26 NOTE — Progress Notes (Signed)
ELECTROPHYSIOLOGY CONSULT NOTE  Patient ID: Sophia Cox, MRN: [id number], DOB/AGE: [birth year] 67 y.o. Admit date: (Not on file) Date of Consult: 04/26/2013  Primary Physician: No PCP Per Patient Primary Cardiologist: New  Chief Complaint: svt    HPI Sophia Cox is a 67 y.o. female seen on referral from the emergency room for recurrent supraventricular tachycardia. These have required conversion in the past. She has had 3 episodes all in the last 1-1/2 years. They have lasted hours. They're associated with lightheadedness and presyncope some chest tightness and some shortness of breath. They are frog positive  Myoview scan 1/13 demonstrated no ischemia with an ejection fraction of 62%;  Baseline functional status is modest.  She has diabetes. she is not on therapy. she took insulin for a while but coughing 50 pound weight gain since she discontinued it. she has hypertension that is not treated. She does have an appointment with the diabetes clinic next week.  She does snore.    Past Medical History  Diagnosis Date  . Diabetes mellitus   . Tachycardia   . Hypertension       Surgical History:  Past Surgical History  Procedure Laterality Date  . Cholecystectomy       Home Meds: Prior to Admission medications   Medication Sig Start Date End Date Taking? Authorizing Provider  acetaminophen (TYLENOL) 500 MG tablet Take 2,000 mg by mouth 2 (two) times daily as needed for pain or fever. For pain   Yes Historical Provider, MD  ibuprofen (ADVIL,MOTRIN) 200 MG tablet Take 400 mg by mouth 2 (two) times daily as needed. For pain   Yes Historical Provider, MD      Allergies:  Allergies  Allergen Reactions  . Eggs Or Egg-Derived Products Diarrhea  . Penicillins Other (See Comments)    Childhood reaction - hemorrhaged    History   Social History  . Marital Status: Married    Spouse Name: N/A    Number of Children: N/A  . Years of Education: N/A    Occupational History  . Not on file.   Social History Main Topics  . Smoking status: Never Smoker   . Smokeless tobacco: Never Used  . Alcohol Use: No  . Drug Use: No  . Sexually Active: Not on file   Other Topics Concern  . Not on file   Social History Narrative  . No narrative on file     No family history on file.   ROS:  Please see the history of present illness.   Negative except Some cramping  in her calves  All other systems reviewed and negative.    Physical Exam:   Blood pressure 158/93, pulse 89, height 5' 5.5" (1.664 m), weight 196 lb (88.905 kg). General: Well developed, well nourished female in no acute distress. Head: Normocephalic, atraumatic, sclera non-icteric, no xanthomas, nares are without discharge. EENT: normal Lymph Nodes:  none Back: without scoliosis/kyphosis , no CVA tendersness Neck: Negative for carotid bruits. JVD not elevated. Lungs: Clear bilaterally to auscultation without wheezes, rales, or rhonchi. Breathing is unlabored. Heart: RRR with S1 S2. +S4 Abdomen: Soft, non-tender, non-distended with normoactive bowel sounds. No hepatomegaly. No rebound/guarding. No obvious abdominal masses. Msk:  Strength and tone appear normal for age. Extremities: No clubbing or cyanosis. No edema.  Distal pedal pulses are 2+ and equal bilaterally. Skin: Warm and Dry Neuro: Alert and oriented X 3. CN III-XII intact Grossly normal sensory and motor function . Psych:  Responds to questions appropriately  with a normal affect.      Labs: Cardiac Enzymes No results found for this basename: CKTOTAL, CKMB, TROPONINI,  in the last 72 hours CBC Lab Results  Component Value Date   WBC 6.4 12/17/2011   HGB 16.0* 04/07/2013   HCT 47.0* 04/07/2013   MCV 87.5 12/17/2011   PLT 148* 12/17/2011   PROTIME: No results found for this basename: LABPROT, INR,  in the last 72 hours Chemistry No results found for this basename: NA, K, CL, CO2, BUN, CREATININE, CALCIUM,  LABALBU, PROT, BILITOT, ALKPHOS, ALT, AST, GLUCOSE,  in the last 168 hours Lipids Lab Results  Component Value Date   CHOL 228* 12/23/2011   HDL 31* 12/23/2011   LDLCALC UNABLE TO CALCULATE IF TRIGLYCERIDE OVER 400 mg/dL 12/19/1094   TRIG 045* 4/0/9811   BNP Pro B Natriuretic peptide (BNP)  Date/Time Value Range Status  12/17/2011  2:31 PM <5.0  0 - 125 pg/mL Final   Miscellaneous No results found for this basename: DDIMER    Radiology/Studies:  No results found.  EKG:  Sinus rhythm at 89 Intervals 15/09/38 Prior inferior wall infarct ECG 04/07/2013 this is a narrow QRS tachycardia with a pseudo-S wave in lead 2   Assessment and Plan:    Sophia Cox

## 2013-04-26 NOTE — Patient Instructions (Addendum)
Your physician recommends that you schedule a follow-up appointment with Dr Ladona Ridgel 04/27/13 at 12:00    Your physician has recommended that you have an ablation. Catheter ablation is a medical procedure used to treat some cardiac arrhythmias (irregular heartbeats). During catheter ablation, a long, thin, flexible tube is put into a blood vessel in your groin (upper thigh), or neck. This tube is called an ablation catheter. It is then guided to your heart through the blood vessel. Radio frequency waves destroy small areas of heart tissue where abnormal heartbeats may cause an arrhythmia to start. Please see the instruction sheet given to you today. With Dr Ladona Ridgel for SVT  Your physician has requested that you have an echocardiogram. Echocardiography is a painless test that uses sound waves to create images of your heart. It provides your doctor with information about the size and shape of your heart and how well your heart's chambers and valves are working. This procedure takes approximately one hour. There are no restrictions for this procedure.

## 2013-04-26 NOTE — Assessment & Plan Note (Signed)
As above.

## 2013-04-27 ENCOUNTER — Institutional Professional Consult (permissible substitution): Payer: BC Managed Care – PPO | Admitting: Internal Medicine

## 2013-05-03 ENCOUNTER — Ambulatory Visit (INDEPENDENT_AMBULATORY_CARE_PROVIDER_SITE_OTHER): Payer: BC Managed Care – PPO | Admitting: Internal Medicine

## 2013-05-03 ENCOUNTER — Encounter: Payer: Self-pay | Admitting: Internal Medicine

## 2013-05-03 VITALS — BP 156/88 | HR 85 | Temp 97.4°F | Ht 64.0 in | Wt 197.6 lb

## 2013-05-03 DIAGNOSIS — E119 Type 2 diabetes mellitus without complications: Secondary | ICD-10-CM

## 2013-05-03 DIAGNOSIS — I10 Essential (primary) hypertension: Secondary | ICD-10-CM

## 2013-05-03 LAB — BASIC METABOLIC PANEL WITH GFR
BUN: 10 mg/dL (ref 6–23)
Calcium: 9.7 mg/dL (ref 8.4–10.5)
GFR, Est African American: >89 mL/min
Glucose, Bld: 277 mg/dL — ABNORMAL HIGH (ref 70–99)
Sodium: 138 mEq/L (ref 135–145)

## 2013-05-03 MED ORDER — INSULIN GLARGINE 100 UNIT/ML SOLOSTAR PEN: 15.0000 [IU] | PEN_INJECTOR | Freq: Every day | SUBCUTANEOUS | Status: DC

## 2013-05-03 MED ORDER — INSULIN LISPRO 100 UNIT/ML (KWIKPEN): 5.0000 [IU] | PEN_INJECTOR | Freq: Three times a day (TID) | SUBCUTANEOUS | Status: DC

## 2013-05-03 MED ORDER — GLUCOSE BLOOD VI STRP: ORAL_STRIP | Status: DC

## 2013-05-03 NOTE — Patient Instructions (Signed)
Please return to clinic in 2 weeks to followup with the diabetes and blood pressure. Also make an appointment to see Sophia Cox, our diabetes and nutrition educator.  Your medications were sent to your pharmacy.  Please check your blood sugar 4 times per day: once in the morning before you eat anything, and after meals. Do this for two weeks until we see you back!  Thanks for coming in today!!

## 2013-05-03 NOTE — Assessment & Plan Note (Addendum)
Lab Results  Component Value Date   HGBA1C 11.4 05/03/2013     Assessment: Diabetes control: poor control (HgbA1C >9%) Progress toward A1C goal:  unchanged Comments:   Plan: Medications:  see below Home glucose monitoring: Frequency: 4 times a day Timing: before breakfast;before meals;at bedtime Instruction/counseling given: reminded to bring blood glucose meter & log to each visit and reminded to bring medications to each visit Educational resources provided: brochure Self management tools provided: copy of home glucose meter download Other plans:   Start Lantus 15 units nightly Humalog pen 5 units with meals  Monitor blood sugar 4 times daily, return to clinic in 2 weeks, meet with Sophia Cox  Patient states that she wants to go slow with her medicines. She is very hesitant to gain a lot of weight like she did the last time she was on insulin, and she also does not like taking any medicines whatsoever. Therefore, we will work with her and start with a very conservative amount of insulin. We will likely need to go up on this, and the patient realizes this, but we will start off slow and keep going up after reevaluation.

## 2013-05-03 NOTE — Progress Notes (Signed)
Patient ID: Sophia Cox, female   DOB: [birth year], 67 y.o.   MRN: [id number]  Internal Medicine Clinic Visit    HPI:  Sophia Cox is a 67 y.o. year old female with a history of poorly controlled diabetes, hypertension, SVT.  She presents to the [facility] to establish care. She is followed by a cardiologist for her SVT and states that she has had an ablation planned.  Guarding or diabetes, patient states that she was diagnosed several years ago and was previously on insulin therapy, but she stopped taking insulin because she gained 50 pounds. At that time, she was taking Humalog and Lantus. She was followed by [facility]. And has not been taking any medicines for her diabetes in the past 2 years and has not been following a diabetic diet. She enjoys eating bread and sweets, high carb foods. She states that she is always hungry and grazes all day long. She states that she would like to lose 20 pounds. She does have urinary frequency and recurrent vulvovaginal yeast infections.  Patient came to clinic because she wants to get her health back on track, but she does not like taking medications. 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 However, her health is getting to a point where she does not feel well all the time and so she is finally admitting that she needs the medications.  She is going to see the ophthalmologist next week for a dilated eye exam.  Patient is a strong family history of diabetes.  She is not interested in starting antihypertensive at this time. We discussed starting a low salt diet.   Past Medical History  Diagnosis Date  . Diabetes mellitus   . Tachycardia   . Hypertension     Past Surgical History  Procedure Laterality Date  . Cholecystectomy       ROS:  A complete review of systems was otherwise negative, except as noted in the HPI.  Allergies: Eggs or egg-derived products and  Penicillins  Medications: Current Outpatient Prescriptions  Medication Sig Dispense Refill  . acetaminophen (TYLENOL) 500 MG tablet Take 2,000 mg by mouth 2 (two) times daily as needed for pain or fever. For pain      . ibuprofen (ADVIL,MOTRIN) 200 MG tablet Take 400 mg by mouth 2 (two) times daily as needed. For pain       No current facility-administered medications for this visit.    History   Social History  . Marital Status: Married    Spouse Name: N/A    Number of Children: N/A  . Years of Education: N/A   Occupational History  . Not on file.   Social History Main Topics  . Smoking status: Never Smoker   . Smokeless tobacco: Never Used  . Alcohol Use: No  . Drug Use: No  . Sexually Active: Not on file   Other Topics Concern  . Not on file   Social History Narrative  . No narrative on file    family history is not on file.  Physical Exam Blood pressure 156/88, pulse 85, temperature 97.4 F (36.3 C), temperature source Oral, height 5\' 4"  (1.626 m), weight 197 lb 9.6 oz (89.631 kg), SpO2 98.00%. General:  No acute distress, alert and oriented x 3, well-appearing Caucasian female HEENT:  PERRL, EOMI, no lymphadenopathy, moist mucous membranes Cardiovascular:  Regular rate and rhythm, no murmurs, rubs or gallops Respiratory:  Clear to auscultation bilaterally, no wheezes, rales, or  rhonchi Abdomen:  Soft, nondistended, nontender, normoactive bowel sounds Extremities:  Warm and well-perfused, no clubbing, cyanosis, or edema. Decreased sensation to monofilament noted on foot exam. She has good pulses, 1+ Skin: Warm, dry, no rashes Neuro: Not anxious appearing, no depressed mood, normal affect  Labs: Lab Results  Component Value Date   CREATININE 0.60 04/07/2013   BUN 18 04/07/2013   NA 137 04/07/2013   K 3.7 04/07/2013   CL 103 04/07/2013   CO2 25 11/03/2012   Lab Results  Component Value Date   WBC 6.4 12/17/2011   HGB 16.0* 04/07/2013   HCT 47.0* 04/07/2013    MCV 87.5 12/17/2011   PLT 148* 12/17/2011      Assessment and Plan:  Plan was discussed with Dr. Kem Kays  FOLLOWUP: Andreas Newport will follow back up in our clinic in approximately 1-2 weeks. Bana Borgmeyer knows to call out clinic in the meantime with any questions or new issues.

## 2013-05-04 ENCOUNTER — Telehealth: Payer: Self-pay | Admitting: Internal Medicine

## 2013-05-04 LAB — URINALYSIS, ROUTINE W REFLEX MICROSCOPIC
Bilirubin Urine: NEGATIVE
Protein, ur: 30 mg/dL — AB
Urobilinogen, UA: 0.2 mg/dL (ref 0.0–1.0)

## 2013-05-04 LAB — URINALYSIS, MICROSCOPIC ONLY

## 2013-05-04 NOTE — Telephone Encounter (Signed)
Spoke with Sophia Cox, confirmed echo appt  5/28. Tried to reschedule echo to the 29th when dr taylor is here and has openings but the schedule. Sophia Cox agreed top come 05-11-13 at 4 pm to see dr Ladona Ridgel.

## 2013-05-04 NOTE — Assessment & Plan Note (Signed)
BP Readings from Last 3 Encounters:  05/03/13 156/88  04/26/13 158/93  04/07/13 121/78    Lab Results  Component Value Date   NA 138 05/03/2013   K 4.1 05/03/2013   CREATININE 0.64 05/03/2013    Assessment: Blood pressure control: moderately elevated Progress toward BP goal:  unchanged Comments:   Plan: Medications:  See below Educational resources provided: brochure;video Self management tools provided: home blood pressure logbook Other plans:   Again, patient is resistant to starting any new medicines. She agreed to start taking Lantus, but she was unwilling to start taking any blood pressure medicines today. We agreed to readdress this at the next visit and she would be willing to start something for her blood pressure then.  -Followup in 2 weeks for diabetes and blood pressure

## 2013-05-04 NOTE — Progress Notes (Signed)
Case discussed with Dr.Kesty soon after the resident saw the patient.  We reviewed the resident's history and exam and pertinent patient test results.  I agree with the assessment, diagnosis and plan of care documented in the resident's note. 

## 2013-05-04 NOTE — Telephone Encounter (Signed)
New problem   Patient calling in to confirm her appt with Dr. Ladona Ridgel for next week    Was told get an appointment with Dr. Ladona Ridgel for next week. spoke with the nurse last week who confirm she has an appt at 4 pm. Inform patient that she has an echo next week on  5/28 @ 4 pm.   And to meet with Dr. Ladona Ridgel since he will be doing the surgery.

## 2013-05-05 MED ORDER — INSULIN PEN NEEDLE 32G X 4 MM MISC: Status: DC

## 2013-05-05 NOTE — Addendum Note (Signed)
Addended by: Maura Crandall on: 05/05/2013 11:51 AM   Modules accepted: Orders

## 2013-05-10 ENCOUNTER — Ambulatory Visit (HOSPITAL_COMMUNITY): Payer: BC Managed Care – PPO | Attending: Internal Medicine | Admitting: Radiology

## 2013-05-10 DIAGNOSIS — I471 Supraventricular tachycardia: Secondary | ICD-10-CM

## 2013-05-10 DIAGNOSIS — I498 Other specified cardiac arrhythmias: Secondary | ICD-10-CM | POA: Insufficient documentation

## 2013-05-10 DIAGNOSIS — R9431 Abnormal electrocardiogram [ECG] [EKG]: Secondary | ICD-10-CM

## 2013-05-10 NOTE — Progress Notes (Signed)
Echocardiogram performed.  

## 2013-05-11 ENCOUNTER — Encounter: Payer: Self-pay | Admitting: Internal Medicine

## 2013-05-11 ENCOUNTER — Encounter: Payer: Self-pay | Admitting: *Deleted

## 2013-05-11 ENCOUNTER — Ambulatory Visit (INDEPENDENT_AMBULATORY_CARE_PROVIDER_SITE_OTHER): Payer: BC Managed Care – PPO | Admitting: Internal Medicine

## 2013-05-11 VITALS — BP 140/89 | HR 83 | Ht 65.0 in | Wt 199.6 lb

## 2013-05-11 DIAGNOSIS — I498 Other specified cardiac arrhythmias: Secondary | ICD-10-CM

## 2013-05-11 DIAGNOSIS — I471 Supraventricular tachycardia: Secondary | ICD-10-CM

## 2013-05-11 NOTE — Assessment & Plan Note (Signed)
I discussed the treatment options with the patient. The risk, goals, benefits, and expectations of catheter ablation of her SVT have been discussed in detail. She wishes to proceed with catheter ablation. This will be scheduled in the next several weeks.

## 2013-05-11 NOTE — Patient Instructions (Addendum)

## 2013-05-11 NOTE — Progress Notes (Signed)
HPI Mr. Rinella is referred today for evaluation of SVT. She is a very pleasant 67 show woman with diabetes and hypertension. She has had multiple episodes of SVT in the past but 3 of which did not stop and required hospital evaluation. Each time intravenous adenosine terminated her SVT. The patient tried to take beta blocker therapy but was intolerant. During her episodes of SVT, she experiences shortness of breath and chest pressure. The episodes start and stop suddenly. They've lasted over an hour in duration. She has had no frank syncope. Allergies  Allergen Reactions  . Eggs Or Egg-Derived Products Diarrhea  . Penicillins Other (See Comments)    Childhood reaction - hemorrhaged     Current Outpatient Prescriptions  Medication Sig Dispense Refill  . acetaminophen (TYLENOL) 500 MG tablet Take 2,000 mg by mouth 2 (two) times daily as needed for pain or fever. For pain      . glucose blood test strip Use to test blood glucose 4 times daily. Dx: 250.00  100 each  12  . ibuprofen (ADVIL,MOTRIN) 200 MG tablet Take 400 mg by mouth 2 (two) times daily as needed. For pain      . Insulin Glargine (LANTUS SOLOSTAR) 100 UNIT/ML SOPN Inject 15 Units into the skin at bedtime.  3 mL  4  . insulin lispro (HUMALOG KWIKPEN) 100 unit/mL SOLN Inject 5 Units into the skin 3 (three) times daily with meals.  3 mL  4  . Insulin Pen Needle 32G X 4 MM MISC Use to test blood glucose 4 times daily. Dx: 250.00  120 each  11   No current facility-administered medications for this visit.     Past Medical History  Diagnosis Date  . Diabetes mellitus   . Tachycardia   . Hypertension     ROS:   All systems reviewed and negative except as noted in the HPI.   Past Surgical History  Procedure Laterality Date  . Cholecystectomy       No family history on file.   History   Social History  . Marital Status: Married    Spouse Name: N/A    Number of Children: N/A  . Years of Education: N/A    Occupational History  . Not on file.   Social History Main Topics  . Smoking status: Never Smoker   . Smokeless tobacco: Never Used  . Alcohol Use: No  . Drug Use: No  . Sexually Active: Not on file   Other Topics Concern  . Not on file   Social History Narrative  . No narrative on file     BP 140/89  Pulse 83  Ht 5\' 5"  (1.651 m)  Wt 199 lb 9.6 oz (90.538 kg)  BMI 33.22 kg/m2  Physical Exam:  Well appearing 67 year old woman,NAD HEENT: Unremarkable Neck:  6 cm JVD, no thyromegally Back:  No CVA tenderness Lungs:  Clear with no wheezes, rales, or rhonchi. HEART:  Regular rate rhythm, no murmurs, no rubs, no clicks Abd:  soft, positive bowel sounds, no organomegally, no rebound, no guarding Ext:  2 plus pulses, no edema, no cyanosis, no clubbing Skin:  No rashes no nodules Neuro:  CN II through XII intact, motor grossly intact  EKG - normal sinus rhythm with no preexcitation.  Assess/Plan:

## 2013-05-18 ENCOUNTER — Ambulatory Visit (INDEPENDENT_AMBULATORY_CARE_PROVIDER_SITE_OTHER): Payer: BC Managed Care – PPO | Admitting: Internal Medicine

## 2013-05-18 ENCOUNTER — Encounter: Payer: Self-pay | Admitting: Internal Medicine

## 2013-05-18 ENCOUNTER — Ambulatory Visit (INDEPENDENT_AMBULATORY_CARE_PROVIDER_SITE_OTHER): Payer: BC Managed Care – PPO | Admitting: Dietician

## 2013-05-18 VITALS — BP 130/84 | HR 92 | Temp 97.7°F | Resp 20 | Ht 65.0 in | Wt 199.9 lb

## 2013-05-18 DIAGNOSIS — E039 Hypothyroidism, unspecified: Secondary | ICD-10-CM

## 2013-05-18 DIAGNOSIS — E119 Type 2 diabetes mellitus without complications: Secondary | ICD-10-CM

## 2013-05-18 DIAGNOSIS — R946 Abnormal results of thyroid function studies: Secondary | ICD-10-CM

## 2013-05-18 DIAGNOSIS — I10 Essential (primary) hypertension: Secondary | ICD-10-CM

## 2013-05-18 DIAGNOSIS — R7989 Other specified abnormal findings of blood chemistry: Secondary | ICD-10-CM

## 2013-05-18 LAB — GLUCOSE, CAPILLARY: Glucose-Capillary: 295 mg/dL — ABNORMAL HIGH (ref 70–99)

## 2013-05-18 MED ORDER — EXENATIDE 5 MCG/0.02ML ~~LOC~~ SOPN: 5.0000 ug | PEN_INJECTOR | Freq: Two times a day (BID) | SUBCUTANEOUS | Status: DC

## 2013-05-18 MED ORDER — METFORMIN HCL 500 MG PO TABS: ORAL_TABLET | ORAL | Status: DC

## 2013-05-18 NOTE — Assessment & Plan Note (Signed)
Lab Results  Component Value Date   HGBA1C 11.4 05/03/2013     Assessment: Diabetes control: poor control (HgbA1C >9%) Progress toward A1C goal:  unchanged Comments: The patient has been non-compliant with her insulin.  Weight gain is her biggest concern.  After discussing with Norm Parcel, we'll start the patient on the weight neutral/weight negative medications, to start to control her DM, with the understanding that she will almost certainly need to go back to insulin at some point.  Plan: Medications:  Stop Lantus and Humalog (non-compliance).  Start Metformin, uptitrating from 500 mg daily to 1000 mg BID.  Start Byetta, 5 mcg BID before meals. Home glucose monitoring: Frequency:   Timing:   Instruction/counseling given: reminded to bring blood glucose meter & log to each visit Educational resources provided: brochure Self management tools provided: home glucose logbook Other plans: Encouraged patient to use glucometer and taker her meds.  Patient notes she recently had an eye exam at [facility], will attempt to get records.

## 2013-05-18 NOTE — Assessment & Plan Note (Addendum)
The patient notes a history of hypothyroidism, previously on a medication whose name she doesn't know (likely synthroid).  TSH = 7.433 last visit. -repeat TSH for confirmation, also check free T4 -will likely need to start synthroid  Addendum: TSH still elevated, free T4 borderline low.  Patient called to discuss results.  She notes cold intolerance and weight gain, and is interested in trying synthroid for improvement in symptoms.  Will start synthroid 25 mcg daily.  Will recheck TSH in 6 weeks

## 2013-05-18 NOTE — Patient Instructions (Signed)
General Instructions: For your diabetes, we are changing your regimen to minimize weight gain. -Start taking Metformin:   -Week 1: take 1 tablet every morning   -Week 2: take 1 tablet every morning, and 1 tablet every evening   -Week 3: take 2 tablets every morning, and 1 tablet every evening   -Week 4: take 2 tablets every morning, and 2 tablets every evening -also start taking Byetta, 1 injection within 60 minutes before a meal  For your thyroid, we are checking your thyroid function, and may need to restart Synthroid.  We will call you with these results.  Please return for a follow-up visit in 2 weeks.   Treatment Goals:  Goals (1 Years of Data) as of 05/18/13         As of Today 05/11/13 05/03/13 04/26/13 04/07/13     Blood Pressure    . Blood Pressure < 140/90  130/84 140/89 156/88 158/93 121/78     Result Component    . HEMOGLOBIN A1C < 7.0    11.4      . LDL CALC < 100            Progress Toward Treatment Goals:  Treatment Goal 05/18/2013  Hemoglobin A1C unchanged  Blood pressure unchanged  Prevent falls unchanged    Self Care Goals & Plans:  Self Care Goal 05/18/2013  Monitor my health keep track of my blood glucose; check my feet daily  Eat healthy foods -  Be physically active -  Prevent falls use home fall prevention checklist to improve safety  Other having trouble keeping down wt and still take meds    Home Blood Glucose Monitoring 05/03/2013  Check my blood sugar 4 times a day  When to check my blood sugar before breakfast; before meals; at bedtime     Care Management & Community Referrals:  Referral 05/18/2013  Referrals made for care management support diabetes educator

## 2013-05-18 NOTE — Progress Notes (Signed)
Diabetes Self-Management Education  Visit Number: First/Initial  05/18/2013 [name], identified by name and date of birth, is a 67 y.o. female with Diabetes Type: Type 2.  Other people present during visit:      ASSESSMENT  Patient Concerns:  Nutrition/Meal planning;Medication;Healthy Lifestyle;Problem Solving;Glycemic Control;Weight Control;Support  Anthropometrics in office visit  Lab Results: LDL Cholesterol  Date Value Range Status  12/23/2011 UNABLE TO CALCULATE IF TRIGLYCERIDE OVER 400 mg/dL  0 - 99 mg/dL Final     Hemoglobin Z6X  Date Value Range Status  05/03/2013 11.4   Final     No family history on file. History  Substance Use Topics  . Smoking status: Never Smoker   . Smokeless tobacco: Never Used  . Alcohol Use: No    Support Systems:    to discuss at future visits Special Needs:  None  Prior DM Education:  yes Daily Foot Exams: No Patient Belief / Attitude about Diabetes:  Diabetes can be controlled, reports is out of denial  Assessment comments:  Patient to follow up for completion of assessment and MNT per her request  Individualized Plan for Diabetes Self-Management Training:  Patient individualized diabetes plan discussed today with patient and includes: what is Diabetes, Nutrition, medications, monitoring, physical activity, how to handle highs and lows,Dealing daily with diabetes  Education Topics Reviewed with Patient Today:  Topic Points Discussed  Disease State    Nutrition Management    Physical Activity and Exercise    Medications Reviewed patients medication for diabetes, action, purpose, timing of dose and side effects.;Other (comment)  Monitoring    Acute Complications    Chronic Complications    Psychosocial Adjustment    Goal Setting    Preconception Care (if applicable)      PATIENTS GOALS   Learning Objective(s):     Goal The patient agrees to:  Nutrition    Physical Activity    Medications take my  medication as prescribed  Monitoring    Problem Solving    Reducing Risk    Health Coping     Patient Self-Evaluation of Goals (Subsequent Visits)  Goal The patient rates self as meeting goals (% of time)  Nutrition    Physical Activity    Medications    Monitoring    Problem Solving    Reducing Risk    Health Coping       PERSONALIZED PLAN / SUPPORT  Self-Management Support:  Doctor's office;CDE visits Patient engaged in meeting for support . Verbalizes need for support more so than education ________________  Outcomes  Expected Outcomes:  Demonstrated interest in learning. Expect positive outcomes Self-Care Barriers:  None Education material provided: yes- Education about Byetta and Metformin self titration and side effects. If problems or questions, patient to contact team via:  Phone Time in: 1630     Time out: 1700 Future DSME appointment: - PRN   Sophia Cox, Sophia Cox 05/18/2013 5:20 PM

## 2013-05-18 NOTE — Progress Notes (Signed)
HPI The patient is a 67 y.o. female with a history of DM, HTN, SVT, presenting for a follow-up visit.  The patient recently had her 1st visit with our clinic 05/03/13, and presented with poorly controlled DM, with an A1C of 11.4, untreated.  She was started on Lantus 15, as well as Humalog 5 units TID, but she notes that she has not taken this regularly, and hasn't taken this at all for 1 week.  Since restarting insulin, the patient notes gaining a few pounds.  The patient states she understands that diet can play a role in weight loss, but seems to mostly blame the insulin.  The patient notes that her blood sugars have ranged from a low of 250 to a high of not registering on the meter.  She notes checking her blood sugar 4x/day for the first few days after her last appointment, but now hasn't checked it in the last 3 days.  The patient has complications of diabetic neuropathy, with numbness and burning to th elevel of the mid-shin.  The patient's BP was elevated at her last visit.  She refused medication for this at her last visit, so was counseled on a low-salt diet.  BP today is 130/84.  The patient was also found to have a TSH of 7.4 at that visit.  The patient notes a prior history of hypothyroidism, previously on a medication (?synthroid), but discontinued this medication 2 years ago, along with all other medications.  ROS: General: no fevers, chills Skin: no rash HEENT: no blurry vision, hearing changes, sore throat Pulm: no dyspnea, coughing, wheezing CV: no chest pain, palpitations, shortness of breath Abd: no abdominal pain, nausea/vomiting, diarrhea/constipation GU: no dysuria, hematuria, polyuria Ext: no arthralgias, myalgias Neuro: no weakness, numbness, or tingling  Filed Vitals:   05/18/13 1543  BP: 130/84  Pulse: 92  Temp: 97.7 F (36.5 C)  Resp: 20    PEX General: alert, cooperative, and in no apparent distress HEENT: pupils equal round and reactive to light, vision  grossly intact, oropharynx clear and non-erythematous  Neck: supple, no lymphadenopathy Lungs: clear to ascultation bilaterally, normal work of respiration, no wheezes, rales, ronchi Heart: regular rate and rhythm, no murmurs, gallops, or rubs Abdomen: soft, non-tender, non-distended, normal bowel sounds Extremities: no cyanosis, clubbing, or edema Neurologic: alert & oriented X3, cranial nerves II-XII intact, strength grossly intact, sensation intact to light touch  Current Outpatient Prescriptions on File Prior to Visit  Medication Sig Dispense Refill  . acetaminophen (TYLENOL) 500 MG tablet Take 2,000 mg by mouth 2 (two) times daily as needed for pain or fever. For pain      . glucose blood test strip Use to test blood glucose 4 times daily. Dx: 250.00  100 each  12  . ibuprofen (ADVIL,MOTRIN) 200 MG tablet Take 400 mg by mouth 2 (two) times daily as needed. For pain      . Insulin Glargine (LANTUS SOLOSTAR) 100 UNIT/ML SOPN Inject 15 Units into the skin at bedtime.  3 mL  4  . insulin lispro (HUMALOG KWIKPEN) 100 unit/mL SOLN Inject 5 Units into the skin 3 (three) times daily with meals.  3 mL  4  . Insulin Pen Needle 32G X 4 MM MISC Use to test blood glucose 4 times daily. Dx: 250.00  120 each  11   No current facility-administered medications on file prior to visit.    Assessment/Plan

## 2013-05-18 NOTE — Assessment & Plan Note (Signed)
BP Readings from Last 3 Encounters:  05/18/13 130/84  05/11/13 140/89  05/03/13 156/88    Lab Results  Component Value Date   NA 138 05/03/2013   K 4.1 05/03/2013   CREATININE 0.64 05/03/2013    Assessment: Blood pressure control: controlled Progress toward BP goal:  unchanged Comments: Surprisingly, BP is lower today.  It's unclear whether the patient was compliant with a low-salt diet, or has increased her exercise  Plan: Medications:  Continue diet/exercise changes for now Educational resources provided: brochure Self management tools provided: home blood pressure logbook Other plans: Recheck at next visit

## 2013-05-19 LAB — MICROALBUMIN / CREATININE URINE RATIO: Microalb, Ur: 3.39 mg/dL — ABNORMAL HIGH (ref 0.00–1.89)

## 2013-05-19 MED ORDER — LEVOTHYROXINE SODIUM 25 MCG PO TABS: 25.0000 ug | ORAL_TABLET | Freq: Every day | ORAL | Status: DC

## 2013-05-19 NOTE — Progress Notes (Signed)
TEACHING ATTENDING ADDENDUM: I discussed this case with Dr. Brown at the time of the patient visit. I agree with the HPI, exam findings and have read the documentation provided by the resident,  and I concur with the plan of care. Please see the resident note for details of management.   

## 2013-05-19 NOTE — Addendum Note (Signed)
Addended by: Linward Headland on: 05/19/2013 06:03 PM   Modules accepted: Orders

## 2013-05-29 ENCOUNTER — Telehealth: Payer: Self-pay | Admitting: Internal Medicine

## 2013-05-29 NOTE — Telephone Encounter (Signed)
Spoke with pt about recent echo results. 

## 2013-05-29 NOTE — Telephone Encounter (Signed)
New problem   Per pt she's returning a call from a triage nurse regarding echocard results

## 2013-06-01 ENCOUNTER — Ambulatory Visit (INDEPENDENT_AMBULATORY_CARE_PROVIDER_SITE_OTHER): Payer: BC Managed Care – PPO | Admitting: Dietician

## 2013-06-01 ENCOUNTER — Encounter: Payer: Self-pay | Admitting: Internal Medicine

## 2013-06-01 ENCOUNTER — Other Ambulatory Visit: Payer: Self-pay | Admitting: Internal Medicine

## 2013-06-01 ENCOUNTER — Ambulatory Visit (INDEPENDENT_AMBULATORY_CARE_PROVIDER_SITE_OTHER): Payer: BC Managed Care – PPO | Admitting: Internal Medicine

## 2013-06-01 VITALS — BP 138/92 | HR 97 | Temp 97.4°F | Ht 64.5 in | Wt 196.5 lb

## 2013-06-01 DIAGNOSIS — E119 Type 2 diabetes mellitus without complications: Secondary | ICD-10-CM

## 2013-06-01 DIAGNOSIS — E039 Hypothyroidism, unspecified: Secondary | ICD-10-CM

## 2013-06-01 DIAGNOSIS — I10 Essential (primary) hypertension: Secondary | ICD-10-CM

## 2013-06-01 NOTE — Progress Notes (Signed)
HPI The patient is a 67 y.o. female with a history of DM, hypothyroidism, presenting for a follow-up visit.  The patient has a history of DM.  At her last visit, she was started on metformin and byetta.  She has been using these medications.  She notes some mild nausea, which she attributes to Byetta.  She notes that her blood sugars are lower than they have ever been, which she is happy about.  She brings her glucometer today, which reveals a range in blood sugars from 174-401, though with most values in the 200's.  She has maintained her weight, which she is happy about.  She notes problems with both diarrhea (up to 5 stools per day some days) and constipation since our last visit, though upon further questioning notes that this has been a chronic issue for her.  The patient has been uptitrating metformin, currently taking 500 mg BID.  CBG today is 137.  The patient was started on Synthroid 2 weeks ago, taking 25 mcg daily.  She notes that she believes she has more energy for the last couple of days, as well as improved concentration.  ROS: General: no fevers, chills, changes in weight, changes in appetite Skin: no rash HEENT: no blurry vision, hearing changes, sore throat Pulm: no dyspnea, coughing, wheezing CV: no chest pain, palpitations, shortness of breath Abd: no abdominal pain, nausea/vomiting, diarrhea/constipation GU: no dysuria, hematuria, polyuria Ext: no arthralgias, myalgias Neuro: no weakness, numbness, or tingling  Filed Vitals:   06/01/13 1420  BP: 138/92  Pulse: 97  Temp: 97.4 F (36.3 C)    PEX General: alert, cooperative, and in no apparent distress HEENT: pupils equal round and reactive to light, vision grossly intact, oropharynx clear and non-erythematous  Neck: supple, no lymphadenopathy Lungs: clear to ascultation bilaterally, normal work of respiration, no wheezes, rales, ronchi Heart: regular rate and rhythm, no murmurs, gallops, or rubs Abdomen: soft,  non-tender, non-distended, normal bowel sounds Extremities: no cyanosis, clubbing, or edema Neurologic: alert & oriented X3, cranial nerves II-XII intact, strength grossly intact, sensation intact to light touch  Current Outpatient Prescriptions on File Prior to Visit  Medication Sig Dispense Refill  . acetaminophen (TYLENOL) 500 MG tablet Take 2,000 mg by mouth 2 (two) times daily as needed for pain or fever. For pain      . exenatide (BYETTA 5 MCG PEN) 5 MCG/0.02ML SOPN Inject 0.02 mLs (5 mcg total) into the skin 2 (two) times daily with a meal.  1.2 mL  3  . glucose blood test strip Use to test blood glucose 4 times daily. Dx: 250.00  100 each  12  . ibuprofen (ADVIL,MOTRIN) 200 MG tablet Take 400 mg by mouth 2 (two) times daily as needed. For pain      . Insulin Pen Needle 32G X 4 MM MISC Use to test blood glucose 4 times daily. Dx: 250.00  120 each  11  . levothyroxine (LEVOTHROID) 25 MCG tablet Take 1 tablet (25 mcg total) by mouth daily.  30 tablet  11  . metFORMIN (GLUCOPHAGE) 500 MG tablet For week 1, take 1 tab per day.  For week 2, take 1 tab twice per day.  For week 3, take 2 tabs in the morning, 1 tab in the evening.  For week 4, take 2 tabs twice per day, and continue this dose.  120 tablet  3   No current facility-administered medications on file prior to visit.    Assessment/Plan

## 2013-06-01 NOTE — Assessment & Plan Note (Signed)
BP Readings from Last 3 Encounters:  06/01/13 138/92  05/18/13 130/84  05/11/13 140/89    Lab Results  Component Value Date   NA 138 05/03/2013   K 4.1 05/03/2013   CREATININE 0.64 05/03/2013    Assessment: Blood pressure control: controlled Progress toward BP goal:  at goal Comments: Patient desires lifestyle modification, resists medications  Plan: Medications:  Continue lifestyle changes Educational resources provided:   Self management tools provided:   Other plans: Recheck at next visit

## 2013-06-01 NOTE — Progress Notes (Signed)
Medical Nutrition Therapy:  Appt start time: 1415 end time:  1515. Session 1 Assessment:  Primary concerns today: Weight management, Blood sugar control and Meal planning.  Patient hs made progress on blood sugars, more in 200s now.  Reports she is enjoying her decreased appetite.  Usual eating pattern includes 2-3 meals and 2-4 snacks per day. Eats very light earlier in day and heavier at night. Skips lunch ~ 1x/week. Her goal is improved blood sugars and moderate weight loss.~ 12#. Estimated calorie needs ~ 2000 /day, 400- 600/meal, 100-200/snack Usual physical activity limited per patient.  Progress Towards Goal(s):  In progress.   Nutritional Diagnosis:  NB-1.1 Food and nutrition-related knowledge deficit As related to lack of prior exposure to meal planning.  As evidenced by her report and questions.    Intervention:  Nutrition education about basic principles of meal planning for diabetes. Coordination of care- discussed up titration of current medicines with physician.   Monitoring/Evaluation:  Dietary intake, exercise, blood sugars, and body weight in 4 week(s).

## 2013-06-01 NOTE — Patient Instructions (Signed)
General Instructions: For your diabetes: -we will increase your Metformin slowly, increasing your dose every 2 weeks    -in approximately 1 week (around 6/26), increase to 1000 mg every morning, 500 mg every evening    -in approximately 3 weeks (around 7/10), increase to 1000 mg twice per day -continue taking Byetta, 5 mg twice per day  We did find evidence of protein in your urine.  We recommend a medication called Lisinopril, which can decrease the loss of protein in your urine, and protect your kidneys.  We can continue to revisit this in the future.  Please return for a follow-up visit in about 4 weeks.   Treatment Goals:  Goals (1 Years of Data) as of 06/01/13         As of Today 05/18/13 05/11/13 05/03/13 04/26/13     Blood Pressure    . Blood Pressure < 140/90  138/92 130/84 140/89 156/88 158/93     Result Component    . HEMOGLOBIN A1C < 7.0     11.4     . LDL CALC < 100            Progress Toward Treatment Goals:  Treatment Goal 06/01/2013  Hemoglobin A1C improved  Blood pressure at goal  Prevent falls unchanged    Self Care Goals & Plans:  Self Care Goal 05/18/2013  Monitor my health keep track of my blood glucose; check my feet daily  Eat healthy foods -  Be physically active -  Prevent falls use home fall prevention checklist to improve safety  Other having trouble keeping down wt and still take meds    Home Blood Glucose Monitoring 06/01/2013  Check my blood sugar 3 times a day  When to check my blood sugar before meals     Care Management & Community Referrals:  Referral 06/01/2013  Referrals made for care management support diabetes educator

## 2013-06-01 NOTE — Assessment & Plan Note (Signed)
Lab Results  Component Value Date   HGBA1C 11.4 05/03/2013     Assessment: Diabetes control: fair control Progress toward A1C goal:  improved Comments: Blood sugars have been more well-controlled since starting metformin and Byetta.  She notes side effects of nausea, which could be due to either or both medications, but side effects are not prohibitive.  Overall she is happy with her treatment course, though still prefers as few medications and interventions as possible.  Labs from her last visit showed microalbuminuria, which we discussed today, but the patient refuses ACE-I, due to not wanting to take any more medications.  Plan: Medications:  Continue Byetta 5 BID.  Can consider increasing to 10 BID after 1 month, though this may be limited by nausea.  Continue to uptitrate metformin, though we will go more slowly, waiting 2 weeks between dose increases, to goal of 1000 mg BID. Home glucose monitoring: Frequency: 3 times a day Timing: before meals Instruction/counseling given: reminded to bring blood glucose meter & log to each visit Educational resources provided:   Self management tools provided:   Other plans: Continue to encourage ACE-I usage at future visits.

## 2013-06-01 NOTE — Progress Notes (Signed)
Case discussed with Dr. Brown at the time of the visit.  We reviewed the resident's history and exam and pertinent patient test results.  I agree with the assessment, diagnosis, and plan of care documented in the resident's note. 

## 2013-06-01 NOTE — Assessment & Plan Note (Signed)
The patient has a history of hypothyroidism.  At her last visit, TSH and free T4 showed borderline hypothyroidism, and since patient exhibited symptoms, the patient was started on synthroid.  She notes improved energy and concentration since starting this medication. -continue synthroid, recheck TSH at next visit

## 2013-06-07 ENCOUNTER — Encounter (HOSPITAL_COMMUNITY): Payer: Self-pay | Admitting: Pharmacy Technician

## 2013-06-14 ENCOUNTER — Other Ambulatory Visit: Payer: Self-pay | Admitting: *Deleted

## 2013-06-19 ENCOUNTER — Telehealth: Payer: Self-pay | Admitting: Internal Medicine

## 2013-06-19 ENCOUNTER — Other Ambulatory Visit (INDEPENDENT_AMBULATORY_CARE_PROVIDER_SITE_OTHER): Payer: BC Managed Care – PPO

## 2013-06-19 DIAGNOSIS — I471 Supraventricular tachycardia: Secondary | ICD-10-CM

## 2013-06-19 DIAGNOSIS — I498 Other specified cardiac arrhythmias: Secondary | ICD-10-CM

## 2013-06-19 LAB — BASIC METABOLIC PANEL
BUN: 16 mg/dL (ref 6–23)
Chloride: 103 mEq/L (ref 96–112)
Glucose, Bld: 189 mg/dL — ABNORMAL HIGH (ref 70–99)
Potassium: 4 mEq/L (ref 3.5–5.1)

## 2013-06-19 LAB — CBC WITH DIFFERENTIAL/PLATELET
Eosinophils Relative: 3.9 % (ref 0.0–5.0)
HCT: 38.8 % (ref 36.0–46.0)
Lymphs Abs: 1.8 10*3/uL (ref 0.7–4.0)
MCV: 93.4 fl (ref 78.0–100.0)
Monocytes Absolute: 0.4 10*3/uL (ref 0.1–1.0)
Platelets: 169 10*3/uL (ref 150.0–400.0)
RDW: 12.9 % (ref 11.5–14.6)
WBC: 6.5 10*3/uL (ref 4.5–10.5)

## 2013-06-19 NOTE — Telephone Encounter (Signed)
Called patient to provide instructions for needed lab work that needs to be done today in preparation for her procedure this week. Patient states she will come to lab today at 3pm to get labs drawn.

## 2013-06-19 NOTE — Telephone Encounter (Signed)
New Problem:    Patient called in wanting to know if she still needed to come in for blood work for her upcoming procedure on 06/21/13.  Please call back.

## 2013-06-21 ENCOUNTER — Encounter (HOSPITAL_COMMUNITY)
Admission: RE | Disposition: A | Discharge status: Home / Self Care | Payer: Self-pay | Source: Ambulatory Visit | Attending: Internal Medicine

## 2013-06-21 ENCOUNTER — Encounter (HOSPITAL_COMMUNITY): Payer: Self-pay | Admitting: General Practice

## 2013-06-21 ENCOUNTER — Ambulatory Visit (HOSPITAL_COMMUNITY)
Admission: RE | Admit: 2013-06-21 | Discharge: 2013-06-22 | Disposition: A | Discharge status: Home / Self Care | Payer: BC Managed Care – PPO | Source: Ambulatory Visit | Attending: Internal Medicine | Admitting: Internal Medicine

## 2013-06-21 DIAGNOSIS — I498 Other specified cardiac arrhythmias: Secondary | ICD-10-CM | POA: Insufficient documentation

## 2013-06-21 DIAGNOSIS — I10 Essential (primary) hypertension: Secondary | ICD-10-CM | POA: Insufficient documentation

## 2013-06-21 DIAGNOSIS — Z79899 Other long term (current) drug therapy: Secondary | ICD-10-CM | POA: Insufficient documentation

## 2013-06-21 DIAGNOSIS — R9431 Abnormal electrocardiogram [ECG] [EKG]: Secondary | ICD-10-CM

## 2013-06-21 DIAGNOSIS — I471 Supraventricular tachycardia, unspecified: Secondary | ICD-10-CM

## 2013-06-21 DIAGNOSIS — E039 Hypothyroidism, unspecified: Secondary | ICD-10-CM

## 2013-06-21 DIAGNOSIS — E119 Type 2 diabetes mellitus without complications: Secondary | ICD-10-CM | POA: Insufficient documentation

## 2013-06-21 HISTORY — PX: SUPRAVENTRICULAR TACHYCARDIA ABLATION: SHX6106

## 2013-06-21 HISTORY — DX: Pneumonia, unspecified organism: J18.9

## 2013-06-21 HISTORY — DX: Unspecified osteoarthritis, unspecified site: M19.90

## 2013-06-21 HISTORY — DX: Supraventricular tachycardia, unspecified: I47.10

## 2013-06-21 HISTORY — PX: SUPRAVENTRICULAR TACHYCARDIA ABLATION: SHX5492

## 2013-06-21 HISTORY — DX: Hypothyroidism, unspecified: E03.9

## 2013-06-21 HISTORY — DX: Type 2 diabetes mellitus without complications: E11.9

## 2013-06-21 HISTORY — DX: Headache: R51

## 2013-06-21 HISTORY — DX: Supraventricular tachycardia: I47.1

## 2013-06-21 LAB — GLUCOSE, CAPILLARY
Glucose-Capillary: 161 mg/dL — ABNORMAL HIGH (ref 70–99)
Glucose-Capillary: 130 mg/dL — ABNORMAL HIGH (ref 70–99)
Glucose-Capillary: 205 mg/dL — ABNORMAL HIGH (ref 70–99)

## 2013-06-21 SURGERY — SUPRAVENTRICULAR TACHYCARDIA ABLATION
Anesthesia: LOCAL

## 2013-06-21 MED ORDER — MIDAZOLAM HCL 5 MG/5ML IJ SOLN
INTRAMUSCULAR | Status: AC
  Filled 2013-06-21: qty 5

## 2013-06-21 MED ORDER — BUPIVACAINE HCL (PF) 0.25 % IJ SOLN
INTRAMUSCULAR | Status: AC
  Filled 2013-06-21: qty 60

## 2013-06-21 MED ORDER — ONDANSETRON HCL 4 MG/2ML IJ SOLN: 4.0000 mg | Freq: Four times a day (QID) | INTRAMUSCULAR | Status: DC | PRN

## 2013-06-21 MED ORDER — LEVOTHYROXINE SODIUM 25 MCG PO TABS
25.0000 ug | ORAL_TABLET | Freq: Every day | ORAL | Status: DC
  Administered 2013-06-22: 06:00:00 25 ug via ORAL
  Filled 2013-06-21 (×3): qty 1

## 2013-06-21 MED ORDER — FENTANYL CITRATE 0.05 MG/ML IJ SOLN
INTRAMUSCULAR | Status: AC
  Filled 2013-06-21: qty 2

## 2013-06-21 MED ORDER — SODIUM CHLORIDE 0.9 % IJ SOLN: 3.0000 mL | INTRAMUSCULAR | Status: DC | PRN

## 2013-06-21 MED ORDER — SODIUM CHLORIDE 0.9 % IJ SOLN: 3.0000 mL | Freq: Two times a day (BID) | INTRAMUSCULAR | Status: DC

## 2013-06-21 MED ORDER — INSULIN ASPART 100 UNIT/ML ~~LOC~~ SOLN: 0.0000 [IU] | Freq: Three times a day (TID) | SUBCUTANEOUS | Status: DC

## 2013-06-21 MED ORDER — ACETAMINOPHEN 325 MG PO TABS
ORAL_TABLET | ORAL | Status: AC
  Filled 2013-06-21: qty 2

## 2013-06-21 MED ORDER — GLUCOSE BLOOD VI STRP: 1.0000 | ORAL_STRIP | Freq: Three times a day (TID) | Status: DC

## 2013-06-21 MED ORDER — ACETAMINOPHEN 325 MG PO TABS
650.0000 mg | ORAL_TABLET | ORAL | Status: DC | PRN
  Administered 2013-06-21: 650 mg via ORAL

## 2013-06-21 MED ORDER — METFORMIN HCL 500 MG PO TABS
500.0000 mg | ORAL_TABLET | Freq: Two times a day (BID) | ORAL | Status: DC
  Administered 2013-06-22: 500 mg via ORAL
  Filled 2013-06-21 (×4): qty 1

## 2013-06-21 MED ORDER — SODIUM CHLORIDE 0.9 % IV SOLN: 250.0000 mL | INTRAVENOUS | Status: DC | PRN

## 2013-06-21 MED ORDER — HEPARIN (PORCINE) IN NACL 2-0.9 UNIT/ML-% IJ SOLN
INTRAMUSCULAR | Status: AC
  Filled 2013-06-21: qty 500

## 2013-06-21 MED ORDER — ACETAMINOPHEN 325 MG PO TABS: 650.0000 mg | ORAL_TABLET | ORAL | Status: DC | PRN

## 2013-06-21 MED ORDER — EXENATIDE 5 MCG/0.02ML ~~LOC~~ SOPN: 5.0000 ug | PEN_INJECTOR | Freq: Two times a day (BID) | SUBCUTANEOUS | Status: DC

## 2013-06-21 MED ORDER — OFF THE BEAT BOOK
Freq: Once | Status: AC
  Administered 2013-06-22: 05:00:00
  Filled 2013-06-21: qty 1

## 2013-06-21 MED ORDER — INSULIN ASPART 100 UNIT/ML ~~LOC~~ SOLN
0.0000 [IU] | Freq: Three times a day (TID) | SUBCUTANEOUS | Status: DC
  Administered 2013-06-21: 5 [IU] via SUBCUTANEOUS

## 2013-06-21 NOTE — Progress Notes (Signed)
CBG 205. Hospital does not carry Byetta, pt aware and is planning for d/c home in am.  If not discharged, advised pt to have husband bring byetta in.  Ward Givens NP informed of CBG, orders received.  Advised pt best practice policy to give insulin to treat CBG>200 in hospital, pt thinks it is unnecessary but agrees anyway.  Insulin injection drawn up by RN but given to self by pt per her request.  Pt reports she was on insulin and gained much weight.  Discussed risks of blindness, limb loss, and heart disease due to chronically elevated blood sugar.  Pt states she is aware of all these risks but is not concerned about CBG of 205 because her sugar runs 200-300's at home.  Notes reviewed, pt is being seen by [facility] and med changes have recently been made.  Discussed possibility that metformin may be held for up to 48 hrs after procedure due to iv dye but that will be clearly written on d/c instructions in AM.   Discussed synthroid, pt states she takes it with metformin with meal due to hx of nausea with meds.  Encouraged pt to try synthroid on empty stomach as pharmacy recommends but to be consistent with however she decides to take it.  Pt denies complaints of pain, has been up to bathroom without dizziness.  Rt groin and RIJ bandages D&I, level 0.

## 2013-06-21 NOTE — Op Note (Signed)
NAMEASTOU, LADA NO.:  [id number]  MEDICAL RECORD NO.:  [id number]  LOCATION:  6C01C                        FACILITY:  [facility]  PHYSICIAN:  Doylene Canning. 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  INTRODUCTION:  The patient is a 67 year old woman, who has had a history of recurrent tachy palpitations with heart rates of over 170 beats per minute.  It started to stop suddenly and have been demonstrated to be due to a narrow QRS tachycardia.  Despite medical therapy.  She has had recurrent symptoms and is now referred for catheter ablation.  PROCEDURE IN DETAIL:  After informed was obtained, the patient was taken to the diagnostic electrophysiology laboratory in a fasting state. After usual preparation and draping, intravenous fentanyl and midazolam was given for sedation.  A 6-French hexapolar catheter was inserted percutaneously into the right jugular vein and advanced into the coronary sinus.  A 6-French quadripolar catheter was inserted percutaneously in the right femoral vein and advanced to the right ventricle.  A 6-French quadripolar catheter was inserted percutaneously in the right femoral vein and advanced to the His bundle region.  After measurement of the basic intervals, rapid ventricular pacing was carried out from the right ventricle at base drive cycle length of 213 milliseconds and stepwise decreased down to 400 milliseconds where VA Wenckebach was observed.  During rapid ventricular pacing, the atrial activation sequence appeared to be midline and decremental.  Next, programed ventricular stimulation was carried out from the right ventricle at a base drive cycle length of 086 milliseconds.  The S1-S2 interval was stepwise decreased down to 320  milliseconds where the retrograde AV node ERP was observed.  During programed ventricular stimulation, the atrial activation sequence was midline and decremental. Next, programed atrial stimulation was carried out from the atrium at a base drive cycle length of 578 milliseconds.  The S1-S2 interval was stepwise decreased down to 280 milliseconds resulting in the initiation of SVT.  Mapping demonstrated a long RP tachycardia.  The atrial activation sequence/the earliest in the CS proximal, but nearly the same as the His.  PVCs placed at time of His bundle refractoriness during tachycardia demonstrated no pre-excitation of the atrium.  Ventricular pacing during tachycardia demonstrated a AV conduction sequence, and occasionally would terminate the tachycardia altogether.  At this point, a diagnosis of AV node reentrant tachycardia was made.  Although there was question about whether or not the patient might have a decrementally conducting left posteroseptal accessory pathway based on the activation sequence.  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  A 7-French quadripolar ablation catheter was then maneuvered into the right atrium. It should be noted that, additional rapid atrial pacing was carried out prior to ablation and AV Wenckebach cycle length was 348 milliseconds. During rapid atrial pacing, the patient had reproducibly inducible SVT. The ablation catheter was then maneuvered into [employer] and mapping of Koch's triangle was carried out.  This demonstrated a average size  Koch's triangle with normal orientation.  A total of 2 RF energy applications were subsequently delivered into [employer].  At an area proximally sites 6 in Koch's triangle more posterior than usual, there was accelerated junctional rhythm.  I cannot tell whether there was intermittent VA block until RF was immediately discontinued.  At  this point, additional rapid atrial pacing was carried out, and programed atrial stimulation.  There was no recurrently inducible SVT.  The patient was observed for over an hour.  During this time, rapid atrial pacing and programed atrial stimulation was carried out, and there was no inducible SVT.  The catheter was then removed.  Hemostasis was assured, and the patient was returned to her room in satisfactory condition.  COMPLICATIONS:  There were no immediate procedure complications.  RESULTS: 1. Baseline ECG demonstrates normal sinus rhythm with normal axis and     intervals. 2. Baseline intervals.  Sinus node cycle length was 665 millisecond,     the QRS duration was 82 millisecond, the HV interval was 46     milliseconds, the AH interval was 77 milliseconds. 3. Rapid ventricular pacing.  Rapid ventricular pacing was carried out     from the right ventricle at a pacing cycle length of 600     milliseconds stepwise decreased down to 400 milliseconds where VA     Wenckebach was observed.  During rapid ventricular pacing, the     atrial activation sequence was midline and decremental. 4. Programed ventricular stimulation:  Programed ventricular     stimulation was carried out from the right ventricle at base drive     cycle length of 500 milliseconds.  The S1 and S2 interval stepwise     decreased down from 440 milliseconds down to 320 milliseconds where     the retrograde AV node ERP was observed.  During programed     ventricular stimulation, the atrial activation sequence appeared to     be midline and decremental.  Next, programed atrial stimulation was     carried out from the atrium at a base drive cycle length of 161     milliseconds and stepwise decreased down to 160 millisecond with AV     node, ERP was observed.  During programed atrial stimulation, there     was easily inducible SVT and S1-S2 coupling interval of 500/280.  A     65F rapid atrial pacing and rapid atrial  pacing was carried out from     the atrium at a base drive cycle length of 096 milliseconds     stepwise decreased down to 340 milliseconds where AV Wenckebach was     observed.  During rapid atrial pacing, the PR interval was less     than the RR interval and there was inducible SVT.  Following     ablation, there was no SVT.  ARRHYTHMIAS OBSERVED: 1. AV node reentrant tachycardia (unusual) initiation was with rapid     atrial pacing and programed atrial stimulation, the duration was     sustained.  This cycle length was 350 milliseconds.  Method of     termination was with ventricular pacing or atrial pacing. 2. Mapping.  Mapping of Koch's triangle demonstrated normal size and     orientation. 3. Two RF energy application were delivered to sites 6 in Koch's     triangle resulting in brief prolonged junctional rhythm.  CONCLUSION:  The study demonstrates successful electrophysiologic study and RF catheter ablation of unusual AV node  reentrant tachycardia with 2 RF energy applications delivered to sites 6 in Koch's triangle more posterior than usual.  There was accelerated junctional rhythm. Following ablation, which was somewhat limited by the close proximity to the AV node, there was no inducible SVT.     Doylene Canning. Ladona Ridgel, MD     GWT/MEDQ  D:  06/21/2013  T:  06/21/2013  Job:  [id number]

## 2013-06-21 NOTE — CV Procedure (Signed)
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 J#[id number].

## 2013-06-21 NOTE — H&P (Signed)
HPI Mr. Perazzo is referred today for evaluation of SVT. She is a very pleasant 1 show woman with diabetes and hypertension. She has had multiple episodes of SVT in the past but 3 of which did not stop and required hospital evaluation. Each time intravenous adenosine terminated her SVT. The patient tried to take beta blocker therapy but was intolerant. During her episodes of SVT, she experiences shortness of breath and chest pressure. The episodes start and stop suddenly. They've lasted over an hour in duration. She has had no frank syncope. Allergies   Allergen  Reactions   .  Eggs Or Egg-Derived Products  Diarrhea   .  Penicillins  Other (See Comments)       Childhood reaction - hemorrhaged           Current Outpatient Prescriptions   Medication  Sig  Dispense  Refill   .  acetaminophen (TYLENOL) 500 MG tablet  Take 2,000 mg by mouth 2 (two) times daily as needed for pain or fever. For pain         .  glucose blood test strip  Use to test blood glucose 4 times daily. Dx: 250.00   100 each   12   .  ibuprofen (ADVIL,MOTRIN) 200 MG tablet  Take 400 mg by mouth 2 (two) times daily as needed. For pain         .  Insulin Glargine (LANTUS SOLOSTAR) 100 UNIT/ML SOPN  Inject 15 Units into the skin at bedtime.   3 mL   4   .  insulin lispro (HUMALOG KWIKPEN) 100 unit/mL SOLN  Inject 5 Units into the skin 3 (three) times daily with meals.   3 mL   4   .  Insulin Pen Needle 32G X 4 MM MISC  Use to test blood glucose 4 times daily. Dx: 250.00   120 each   11       No current facility-administered medications for this visit.           Past Medical History   Diagnosis  Date   .  Diabetes mellitus     .  Tachycardia     .  Hypertension          ROS:    All systems reviewed and negative except as noted in the HPI.      Past Surgical History   Procedure  Laterality  Date   .  Cholecystectomy              No family history on file.      History       Social  History   .  Marital Status:  Married       Spouse Name:  N/A       Number of Children:  N/A   .  Years of Education:  N/A       Occupational History   .  Not on file.       Social History Main Topics   .  Smoking status:  Never Smoker    .  Smokeless tobacco:  Never Used   .  Alcohol Use:  No   .  Drug Use:  No   .  Sexually Active:  Not on file       Other Topics  Concern   .  Not on file       Social History Narrative   .  No narrative on file  BP 140/89  Pulse 83  Ht 5\' 5"  (1.651 m)  Wt 199 lb 9.6 oz (90.538 kg)  BMI 33.22 kg/m2   Physical Exam:   Well appearing 67 year old woman,NAD HEENT: Unremarkable Neck:  6 cm JVD, no thyromegally Back:  No CVA tenderness Lungs:  Clear with no wheezes, rales, or rhonchi. HEART:  Regular rate rhythm, no murmurs, no rubs, no clicks Abd:  soft, positive bowel sounds, no organomegally, no rebound, no guarding Ext:  2 plus pulses, no edema, no cyanosis, no clubbing Skin:  No rashes no nodules Neuro:  CN II through XII intact, motor grossly intact   EKG - normal sinus rhythm with no preexcitation.   Assess/Plan:         SVT (supraventricular tachycardia) - Marinus Maw, MD at 05/11/2013  5:09 PM    Status: Written Related Problem: SVT (supraventricular tachycardia)           I discussed the treatment options with the patient. The risk, goals, benefits, and expectations of catheter ablation of her SVT have been discussed in detail. She wishes to proceed with catheter ablation. This will be scheduled in the next several weeks.

## 2013-06-22 ENCOUNTER — Other Ambulatory Visit: Payer: Self-pay

## 2013-06-22 DIAGNOSIS — I498 Other specified cardiac arrhythmias: Secondary | ICD-10-CM

## 2013-06-22 LAB — GLUCOSE, CAPILLARY

## 2013-06-22 NOTE — Progress Notes (Signed)
Patient ID: Sophia Cox, female   DOB: 14-Jun-1946, 67 y.o.   MRN: [id number] Subjective:  No chest pain or sob.  Objective:  Vital Signs in the last 24 hours: Temp:  [97.7 F (36.5 C)-98.8 F (37.1 C)] 98 F (36.7 C) (07/10 0823) Pulse Rate:  [79-95] 79 (07/10 0823) Resp:  [17-20] 18 (07/10 0823) BP: (123-165)/(69-133) 132/80 mmHg (07/10 0823) SpO2:  [93 %-99 %] 99 % (07/10 0823) Weight:  [197 lb 12 oz (89.7 kg)] 197 lb 12 oz (89.7 kg) (07/10 0029)  Intake/Output from previous day: 07/09 0701 - 07/10 0700 In: 480 [P.O.:480] Out: -  Intake/Output from this shift: Total I/O In: 480 [P.O.:480] Out: -   Physical Exam: Well appearing NAD HEENT: Unremarkable Neck:  No JVD, no thyromegally Lungs:  Clear with no wheezes HEART:  Regular rate rhythm, no murmurs, no rubs, no clicks Abd:  Flat, positive bowel sounds, no organomegally, no rebound, no guarding Ext:  2 plus pulses, no edema, no cyanosis, no clubbing Skin:  No rashes no nodules Neuro:  CN II through XII intact, motor grossly intact  Lab Results:  Recent Labs  06/19/13 1529  WBC 6.5  HGB 13.3  PLT 169.0    Recent Labs  06/19/13 1529  NA 136  K 4.0  CL 103  CO2 24  GLUCOSE 189*  BUN 16  CREATININE 0.7   No results found for this basename: TROPONINI, CK, MB,  in the last 72 hours Hepatic Function Panel No results found for this basename: PROT, ALBUMIN, AST, ALT, ALKPHOS, BILITOT, BILIDIR, IBILI,  in the last 72 hours No results found for this basename: CHOL,  in the last 72 hours No results found for this basename: PROTIME,  in the last 72 hours  Imaging: No results found.  Cardiac Studies: Tele - nsr Assessment/Plan:  1. SVT 2. S/P EPS/RFA AVNRT Rec: ok to discharge home. F/u with Nehemiah Settle in 4-6 weeks. With me as needed.  LOS: 1 day    Gregg Taylor,M.D. 06/22/2013, 10:17 AM

## 2013-06-22 NOTE — Discharge Summary (Signed)
ELECTROPHYSIOLOGY DISCHARGE SUMMARY    Patient ID: Sophia Cox,  MRN: [id number], DOB/AGE: 1946/07/05 67 y.o.  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 HTN 2. DM  Procedures This Admission:  1. EPS +RF ablation 06/21/2013 Arrythmias observed:  1. AV node reentrant tachycardia (unusual) initiation was with rapid  atrial pacing and programed atrial stimulation, the duration was  sustained. This cycle length was 350 milliseconds. Method of  termination was with ventricular pacing or atrial pacing.  2. Mapping. Mapping of Koch's triangle demonstrated normal size and  orientation.  3. Two RF energy application were delivered to sites 6 in Koch's  triangle resulting in brief prolonged junctional rhythm.  Conclusion:  The study demonstrates successful electrophysiologic study  and RF catheter ablation of unusual AV node reentrant tachycardia with 2  RF energy applications delivered to sites 6 in Koch's triangle more  posterior than usual. There was accelerated junctional rhythm.  Following ablation, which was somewhat limited by the close proximity to  the AV node, there was no inducible SVT.  History and Hospital Course:  Sophia Cox presented yesterday for EPS +RF ablation of SVT. She was found to have unusual AVNRT which was ablated, although somewhat limited by the close proximity to the AV node. She tolerated this procedure well without any immediate complication. She remains hemodynamically stable and afebrile. Her groin site is intact without significant bleeding or hematoma. She has been given discharge instructions including wound care and activity restrictions. She will follow-up in clinic in 4-6 weeks. She has been seen, examined and deemed stable for discharge today by Dr.  Lewayne Cox.  Discharge Vitals: Blood pressure 132/80, pulse 79, temperature 98 F (36.7 C), temperature source Oral, resp. rate 18, height 5\' 4"  (1.626 m), weight 197 lb 12 oz (89.7 kg), SpO2 99.00%.   Labs: Lab Results  Component Value Date   WBC 6.5 06/19/2013   HGB 13.3 06/19/2013   HCT 38.8 06/19/2013   MCV 93.4 06/19/2013   PLT 169.0 06/19/2013     Recent Labs Lab 06/19/13 1529  NA 136  K 4.0  CL 103  CO2 24  BUN 16  CREATININE 0.7  CALCIUM 9.5  GLUCOSE 189*    Disposition:  The patient is being discharged in stable condition.  Follow-up:     Follow-up Information   Follow up with Sophia Bunting, MD On 08/02/2013. (At 4:15 PM)    Contact information:   [address] [place] [address] [phone]     Discharge Medications:    Medication List         exenatide 5 MCG/0.02ML Sopn  Commonly known as:  BYETTA 5 MCG PEN  Inject 0.02 mLs (5 mcg total) into the skin 2 (two) times daily with a meal.     glucose blood test strip  Use to test blood glucose 4 times daily. Dx: 250.00     Insulin Pen Needle 32G X 4 MM Misc  Use to test blood glucose 4 times daily. Dx: 250.00     levothyroxine 25 MCG tablet  Commonly known as:  LEVOTHROID  Take 1 tablet (25 mcg total) by mouth daily.     metFORMIN 500 MG tablet  Commonly known as:  GLUCOPHAGE  Take 500 mg by mouth 2 (two) times daily with a meal.  Duration of Discharge Encounter: Greater than 30 minutes including physician time.  Signed, Sophia Duff, PA-C 06/22/2013, 10:16 AM

## 2013-07-06 ENCOUNTER — Encounter: Payer: Self-pay | Admitting: Internal Medicine

## 2013-07-06 ENCOUNTER — Ambulatory Visit (INDEPENDENT_AMBULATORY_CARE_PROVIDER_SITE_OTHER): Payer: BC Managed Care – PPO | Admitting: Internal Medicine

## 2013-07-06 ENCOUNTER — Ambulatory Visit (INDEPENDENT_AMBULATORY_CARE_PROVIDER_SITE_OTHER): Payer: BC Managed Care – PPO | Admitting: Dietician

## 2013-07-06 VITALS — BP 145/94 | HR 91

## 2013-07-06 DIAGNOSIS — E039 Hypothyroidism, unspecified: Secondary | ICD-10-CM

## 2013-07-06 DIAGNOSIS — E119 Type 2 diabetes mellitus without complications: Secondary | ICD-10-CM

## 2013-07-06 DIAGNOSIS — I10 Essential (primary) hypertension: Secondary | ICD-10-CM

## 2013-07-06 LAB — GLUCOSE, CAPILLARY: Glucose-Capillary: 106 mg/dL — ABNORMAL HIGH (ref 70–99)

## 2013-07-06 LAB — T4, FREE: Free T4: 1.04 ng/dL (ref 0.80–1.80)

## 2013-07-06 MED ORDER — METFORMIN HCL 1000 MG PO TABS: 1000.0000 mg | ORAL_TABLET | Freq: Two times a day (BID) | ORAL | Status: DC

## 2013-07-06 MED ORDER — EXENATIDE 5 MCG/0.02ML ~~LOC~~ SOPN: 10.0000 ug | PEN_INJECTOR | Freq: Two times a day (BID) | SUBCUTANEOUS | Status: DC

## 2013-07-06 MED ORDER — BAYER CONTOUR MONITOR W/DEVICE KIT: 1.0000 | PACK | Freq: Two times a day (BID) | Status: DC

## 2013-07-06 NOTE — Assessment & Plan Note (Signed)
BP Readings from Last 3 Encounters:  07/06/13 145/94  06/22/13 132/80  06/22/13 132/80    Lab Results  Component Value Date   NA 136 06/19/2013   K 4.0 06/19/2013   CREATININE 0.7 06/19/2013    Assessment: Blood pressure control: mildly elevated Progress toward BP goal:  unchanged Comments: BP elevated.  Patient also has mild proteinuria.  She needs an ACE-inhibitor, but continues to refuse new medications.  Plan: Medications:  Continue diet/exercise Educational resources provided:   Self management tools provided:   Other plans: Continue to address at future visits

## 2013-07-06 NOTE — Patient Instructions (Signed)
Patient declined after visit summary today

## 2013-07-06 NOTE — Patient Instructions (Signed)
General Instructions: For your Diabetes, we are increasing your Byetta to 10 mcg twice per day.  Your blood pressure is mildly elevated today.  In the near future, we may need to start a medication to control your blood pressure.  We are checking your thyroid levels.  If the results are abnormal, we will contact you.  Please return for a follow-up visit in 1-2 months.   Treatment Goals:  Goals (1 Years of Data) as of 07/06/13         06/22/13 06/22/13 06/22/13 06/21/13 06/21/13     Blood Pressure    . Blood Pressure < 140/90  132/80 130/74 136/69 128/73 123/71     Result Component    . HEMOGLOBIN A1C < 7.0          . LDL CALC < 100            Progress Toward Treatment Goals:  Treatment Goal 07/06/2013  Hemoglobin A1C improved  Blood pressure unchanged  Prevent falls at goal    Self Care Goals & Plans:  Self Care Goal 06/01/2013  Manage my medications take my medicines as prescribed; bring my medications to every visit  Monitor my health keep track of my blood glucose; check my feet daily  Eat healthy foods drink diet soda or water instead of juice or soda; eat foods that are low in salt; eat smaller portions  Be physically active find an activity I enjoy  Prevent falls use home fall prevention checklist to improve safety  Other -    Home Blood Glucose Monitoring 06/01/2013  Check my blood sugar 3 times a day  When to check my blood sugar before meals     Care Management & Community Referrals:  Referral 07/06/2013  Referrals made for care management support none needed

## 2013-07-06 NOTE — Progress Notes (Signed)
Medical Nutrition Therapy:  Appt start time: 1535 end time:  1615. Session 2 Assessment:  Primary concerns today:Blood sugar control and Diabetes self care, Meal planning.  Patient  brought meter- average is 213, range 106- HI. Weight increased 2 #.  Usual eating pattern includes 3 meals and some snacks. Eating lunch consistently now.  Keeping carbs and calories in line with her goals. Reports intermittent diarrhea/constipation, some nausea and widely fluctuating blood sugars for unknown reasons. Estimated calorie needs ~ 2000 /day, 400- 600/meal, 100-200/snack Thinking about increasing her physical activity.  Progress Towards Goal(s):  Some progress.   Nutritional Diagnosis:  NB-1.1 Food and nutrition-related knowledge deficit As related to lack of prior exposure to meal planning improving  As evidenced by her report and questions.    Intervention:  Nutrition counseling about importance of physical activity for blood sugar control. Nutrition education about new meters, gastrointestinal complications of diabetes and side effects of her current medications.  Coordination of care- Discuss patient gastrointestinal symptoms with physician, could consider barium swallow.   Monitoring/Evaluation:  Dietary intake, exercise, blood sugars, and body weight in 4 week(s).

## 2013-07-06 NOTE — Assessment & Plan Note (Addendum)
Lab Results  Component Value Date   HGBA1C 11.4 05/03/2013     Assessment: Diabetes control: fair control Progress toward A1C goal:  improved Comments: The patient's blood sugars are still elevated, though lower than prior.  As expertly identified by Norm Parcel, symptoms of inconsistent CBG's, nausea, and alternating diarrhea/constipation points towards concern for diabetic gastroparesis.    Plan: Medications:  Continue Metformin 1000 mg BID.  Increase Byetta from 5 mcg BID to 10 mcg BID.  Patient is resistant to starting an additional medication for diabetes at this time. Home glucose monitoring: Frequency:   Timing:   Instruction/counseling given: reminded to bring blood glucose meter & log to each visit Educational resources provided:   Self management tools provided: copy of home glucose meter download (UNABLE TO DOWNLOAD/CABLE PROBLEM) Other plans: Patient will consider gastric emptying study.  Patient reports recent eye exam at [facility], will attempt to obtain results.  Given high triglyceride levels in the past, will have patient return for a fasting lipid study in the future.

## 2013-07-06 NOTE — Assessment & Plan Note (Signed)
Patient was started on Synthroid around 6 weeks ago.  Will recheck TSH today.

## 2013-07-06 NOTE — Progress Notes (Signed)
HPI The patient is a 67 y.o. female with a history of DM, hypothyroidism, HTN, SVT, presenting for a follow-up visit.  The patient has a history of DM.  The patient has been able to uptitrate her Metformin to 1000 mg BID.  She continuse to take Byetta.  She continues to note nausea, but not bad enough to consider stopping Byetta.  The patient brings her glucometer, which we are unable to download today, but shows on manual review values between 130 and 300's, with a few values too high to read.  The patient notes a few episodes of blood sugars elevated beyond what her meter can read, though notes frustration that she is unable to identify a trigger for her high blood sugars at times.  She also notes occasional alternating diarrhea and constipation.  The patient had an eye appointment at [facility] within the last year.   The patient was recently hospitalized for SVT.  She notes no further palpitations since that time.  The patient's BP is borderline elevated today.  ROS: General: no fevers, chills, changes in weight, changes in appetite Skin: no rash HEENT: no blurry vision, hearing changes, sore throat Pulm: no dyspnea, coughing, wheezing CV: no chest pain, palpitations, shortness of breath Abd: no abdominal pain, nausea/vomiting, diarrhea/constipation GU: no dysuria, hematuria, polyuria Ext: no arthralgias, myalgias Neuro: no weakness, numbness, or tingling  Filed Vitals:   07/06/13 1701  BP: 145/94  Pulse: 91    PEX General: alert, cooperative, and in no apparent distress HEENT: pupils equal round and reactive to light, vision grossly intact, oropharynx clear and non-erythematous  Neck: supple, no lymphadenopathy Lungs: clear to ascultation bilaterally, normal work of respiration, no wheezes, rales, ronchi Heart: regular rate and rhythm, no murmurs, gallops, or rubs Abdomen: soft, non-tender, non-distended, normal bowel sounds Extremities: no cyanosis, clubbing, or  edema Neurologic: alert & oriented X3, cranial nerves II-XII intact, strength grossly intact, sensation intact to light touch  Current Outpatient Prescriptions on File Prior to Visit  Medication Sig Dispense Refill  . exenatide (BYETTA 5 MCG PEN) 5 MCG/0.02ML SOPN Inject 0.02 mLs (5 mcg total) into the skin 2 (two) times daily with a meal.  1.2 mL  3  . glucose blood test strip Use to test blood glucose 4 times daily. Dx: 250.00  100 each  12  . Insulin Pen Needle 32G X 4 MM MISC Use to test blood glucose 4 times daily. Dx: 250.00  120 each  11  . levothyroxine (LEVOTHROID) 25 MCG tablet Take 1 tablet (25 mcg total) by mouth daily.  30 tablet  11  . metFORMIN (GLUCOPHAGE) 500 MG tablet Take 500 mg by mouth 2 (two) times daily with a meal.       No current facility-administered medications on file prior to visit.    Assessment/Plan

## 2013-07-13 NOTE — Progress Notes (Signed)
Case discussed with Dr. Brown at the time of the visit.  We reviewed the resident's history and exam and pertinent patient test results.  I agree with the assessment, diagnosis, and plan of care documented in the resident's note. 

## 2013-08-02 ENCOUNTER — Ambulatory Visit (INDEPENDENT_AMBULATORY_CARE_PROVIDER_SITE_OTHER): Payer: BC Managed Care – PPO | Admitting: Internal Medicine

## 2013-08-02 ENCOUNTER — Encounter: Payer: Self-pay | Admitting: Internal Medicine

## 2013-08-02 VITALS — BP 140/93 | HR 97 | Ht 65.0 in | Wt 194.6 lb

## 2013-08-02 DIAGNOSIS — R9431 Abnormal electrocardiogram [ECG] [EKG]: Secondary | ICD-10-CM

## 2013-08-02 DIAGNOSIS — I10 Essential (primary) hypertension: Secondary | ICD-10-CM

## 2013-08-02 DIAGNOSIS — I498 Other specified cardiac arrhythmias: Secondary | ICD-10-CM

## 2013-08-02 DIAGNOSIS — I471 Supraventricular tachycardia: Secondary | ICD-10-CM

## 2013-08-02 NOTE — Progress Notes (Signed)
HPI Sophia Cox returns today for followup. She is a very pleasant 67 year old woman with a history of tachycardia palpitations and documented SVT. She underwent catheter ablation approximately 6 weeks ago. Since then she has had no sustained arrhythmias. She has occasional palpitations. She denies chest pain or shortness of breath. No peripheral edema. No groin pain. Allergies  Allergen Reactions  . Eggs Or Egg-Derived Products Diarrhea  . Penicillins Other (See Comments)    Childhood reaction - hemorrhaged     Current Outpatient Prescriptions  Medication Sig Dispense Refill  . Blood Glucose Monitoring Suppl (BAYER CONTOUR MONITOR) W/DEVICE KIT 1 each by Does not apply route 2 (two) times daily at 10 AM and 5 PM.  1 kit  0  . exenatide (BYETTA) 10 MCG/0.04ML SOPN injection Inject into the skin. Inject 10 mcg into the skin 2 times daily with a meal      . glucose blood test strip Use to test blood glucose 4 times daily. Dx: 250.00  100 each  12  . Insulin Pen Needle 32G X 4 MM MISC Use to test blood glucose 4 times daily. Dx: 250.00  120 each  11  . levothyroxine (LEVOTHROID) 25 MCG tablet Take 1 tablet (25 mcg total) by mouth daily.  30 tablet  11  . metFORMIN (GLUCOPHAGE) 1000 MG tablet Take 1 tablet (1,000 mg total) by mouth 2 (two) times daily with a meal.  60 tablet  11   No current facility-administered medications for this visit.     Past Medical History  Diagnosis Date  . Hypertension   . Supraventricular tachycardia   . Pneumonia [phone]    "quit a few times" (06/21/2013)  . Hypothyroidism   . Type II diabetes mellitus   . NWGNFAOZ(308.6)     "weekly" (06/21/2013)  . Arthritis     "left knee" (06/21/2013)    ROS:   All systems reviewed and negative except as noted in the HPI.   Past Surgical History  Procedure Laterality Date  . Supraventricular tachycardia ablation  06/21/2013  . Tonsillectomy  ~ 1952  . Cholecystectomy  ~ 1989  . Tubal ligation  1971      No family history on file.   History   Social History  . Marital Status: Married    Spouse Name: N/A    Number of Children: N/A  . Years of Education: N/A   Occupational History  . Not on file.   Social History Main Topics  . Smoking status: Never Smoker   . Smokeless tobacco: Never Used  . Alcohol Use: 1.8 oz/week    3 Glasses of wine per week  . Drug Use: No  . Sexual Activity: Yes   Other Topics Concern  . Not on file   Social History Narrative  . No narrative on file     BP 140/93  Pulse 97  Ht 5\' 5"  (1.651 m)  Wt 194 lb 9.6 oz (88.27 kg)  BMI 32.38 kg/m2  Physical Exam:  Well appearing 67 year old woman, NAD HEENT: Unremarkable Neck:  No JVD, no thyromegally Back:  No CVA tenderness Lungs:  Clear with no wheezes, rales, or rhonchi. HEART:  Regular rate rhythm, no murmurs, no rubs, no clicks Abd:  soft, positive bowel sounds, no organomegally, no rebound, no guarding Ext:  2 plus pulses, no edema, no cyanosis, no clubbing Skin:  No rashes no nodules Neuro:  CN II through XII intact, motor grossly intact  EKG - normal sinus rhythm  Assess/Plan:

## 2013-08-16 ENCOUNTER — Ambulatory Visit (INDEPENDENT_AMBULATORY_CARE_PROVIDER_SITE_OTHER): Payer: BC Managed Care – PPO | Admitting: Internal Medicine

## 2013-08-16 ENCOUNTER — Ambulatory Visit (INDEPENDENT_AMBULATORY_CARE_PROVIDER_SITE_OTHER): Payer: BC Managed Care – PPO | Admitting: Dietician

## 2013-08-16 ENCOUNTER — Encounter: Payer: Self-pay | Admitting: Internal Medicine

## 2013-08-16 VITALS — BP 138/91 | HR 87 | Temp 97.9°F | Ht 65.0 in | Wt 197.6 lb

## 2013-08-16 DIAGNOSIS — I10 Essential (primary) hypertension: Secondary | ICD-10-CM

## 2013-08-16 DIAGNOSIS — E119 Type 2 diabetes mellitus without complications: Secondary | ICD-10-CM

## 2013-08-16 DIAGNOSIS — R1310 Dysphagia, unspecified: Secondary | ICD-10-CM

## 2013-08-16 DIAGNOSIS — E039 Hypothyroidism, unspecified: Secondary | ICD-10-CM

## 2013-08-16 LAB — LIPID PANEL
HDL: 37 mg/dL — ABNORMAL LOW (ref >39)
Total CHOL/HDL Ratio: 5.8 Ratio

## 2013-08-16 LAB — POCT GLYCOSYLATED HEMOGLOBIN (HGB A1C): Hemoglobin A1C: 6.2

## 2013-08-16 LAB — GLUCOSE, CAPILLARY: Glucose-Capillary: 117 mg/dL — ABNORMAL HIGH (ref 70–99)

## 2013-08-16 MED ORDER — LEVOTHYROXINE SODIUM 50 MCG PO TABS: 50.0000 ug | ORAL_TABLET | Freq: Every day | ORAL | Status: DC

## 2013-08-16 NOTE — Assessment & Plan Note (Signed)
Lab Results  Component Value Date   HGBA1C 6.2 08/16/2013   HGBA1C 11.4 05/03/2013     Assessment: Diabetes control: good control (HgbA1C at goal) Progress toward A1C goal:  at goal Comments: The patient's A1C has decreased from 11.4 initially (untreated) to now 6.2, despite the fact that the last change to her Byetta was made only 6 weeks ago.  The patient still notes some nausea, but not enough to make her want to stop these treatments.  She notes symptoms of nausea, erratic bowel movements, and incongruent CBG's, concerning for diabetic gastroparesis.  Plan: Medications:  Continue metformin 1000 mg BID and Byetta 10 mg BID.  If A1C remains low, we can start decreasing Byetta. Home glucose monitoring: Frequency: 3 times a day Timing: before meals Instruction/counseling given: reminded to bring blood glucose meter & log to each visit Educational resources provided: brochure;handout Self management tools provided: copy of home glucose meter download Other plans: Referred to GI for evaluation of suspected gastroparesis.  Checking lipid panel today.

## 2013-08-16 NOTE — Assessment & Plan Note (Signed)
The patient notes dysphagia, described as food "getting stuck" in the back of her throat, with both solids and liquids.  She has a history of GERD.  Based on symptoms, I suspect esophageal-phase dysphagia, possibly secondary to a stricture from GERD.  Will refer to GI for further evaluation and treatment.  If no obvious cause seen by GI, will need to consider the possibility of her enlarged thyroid causing symptoms of dysphagia, which will likely require evaluation by an endocrinologist. -referral to GI for consideration of barium swallow vs EGD (along with eval for gastroparesis, per above) -thyroid US per above

## 2013-08-16 NOTE — Progress Notes (Signed)
HPI The patient is a 67 y.o. female with a history of DM2, hypothyroidism, SVT, presenting for a follow-up visit.  The patient notes irregularity of bowel movements, describing that some days she doesn't have a bowel movement, and some days she has several loose bowel movements.  She also notes occasional discordant blood sugars, noted as inexplicable high blood sugars 1-2 days after eating a large meal (though not directly after the meal).  The patient also notes nausea, worse since starting metformin and byetta.  The patient also notes an occasional feeling of food "getting stuck" in the back of her throat, with both solids and liquids, present for the last 4 years, progressively worsening.  She describes an episode where she had to induce vomiting due to this discomfort.  She notes no odynophagia.  The patient was started on synthroid 6/5.  Repeat TFT's 07/06/13 showed improvement, subclinical hypothyroidism.  The patient continues to note symptoms of feeling "cold" all the time, persistent dry skin, improved though still somewhat low energy level, occasional difficulty sleeping.  The patient has a history of DM.  The patient did bring her glucometer today.  She notes no episodes of hypoglycemia.  She reports blood sugars 160-180.  Byetta was increased at her last visit, and she notes 1 episode of nausea that was bothersome to her, but otherwise has not been bothered by side effects.  Her A1C has decreased from 11.4 to 6.2.  ROS: General: no fevers, chills, changes in weight, changes in appetite Skin: no rash HEENT: no blurry vision, hearing changes, sore throat Pulm: no dyspnea, coughing, wheezing CV: no chest pain, palpitations, shortness of breath Abd: see HPI GU: no dysuria, hematuria, polyuria Ext: no arthralgias, myalgias Neuro: no weakness, numbness, or tingling  Filed Vitals:   08/16/13 1518  BP: 138/91  Pulse: 87  Temp: 97.9 F (36.6 C)    PEX General: alert, cooperative,  and in no apparent distress HEENT: pupils equal round and reactive to light, vision grossly intact, oropharynx clear and non-erythematous  Neck: supple, no lymphadenopathy.  Thyroid enlarged, mobile, non-tender. Lungs: clear to ascultation bilaterally, normal work of respiration, no wheezes, rales, ronchi Heart: regular rate and rhythm, no murmurs, gallops, or rubs Abdomen: soft, non-tender, non-distended, normal bowel sounds Extremities: no cyanosis, clubbing, or edema Neurologic: alert & oriented X3, cranial nerves II-XII intact, strength grossly intact, sensation intact to light touch  Current Outpatient Prescriptions on File Prior to Visit  Medication Sig Dispense Refill  . Blood Glucose Monitoring Suppl (BAYER CONTOUR MONITOR) W/DEVICE KIT 1 each by Does not apply route 2 (two) times daily at 10 AM and 5 PM.  1 kit  0  . exenatide (BYETTA) 10 MCG/0.04ML SOPN injection Inject into the skin. Inject 10 mcg into the skin 2 times daily with a meal      . glucose blood test strip Use to test blood glucose 4 times daily. Dx: 250.00  100 each  12  . Insulin Pen Needle 32G X 4 MM MISC Use to test blood glucose 4 times daily. Dx: 250.00  120 each  11  . levothyroxine (LEVOTHROID) 25 MCG tablet Take 1 tablet (25 mcg total) by mouth daily.  30 tablet  11  . metFORMIN (GLUCOPHAGE) 1000 MG tablet Take 1 tablet (1,000 mg total) by mouth 2 (two) times daily with a meal.  60 tablet  11   No current facility-administered medications on file prior to visit.    Assessment/Plan

## 2013-08-16 NOTE — Assessment & Plan Note (Signed)
BP Readings from Last 3 Encounters:  08/16/13 138/91  08/02/13 140/93  07/06/13 145/94    Lab Results  Component Value Date   NA 136 06/19/2013   K 4.0 06/19/2013   CREATININE 0.7 06/19/2013    Assessment: Blood pressure control: mildly elevated Progress toward BP goal:  unchanged Comments: The patient's BP remains mildly elevated.  In light of mild proteinuria, I strongly recommend ACE-I, as we have discussed in the past.  The patient again declines this, stating that she does not want to start new medications.  Plan: Medications:  None Educational resources provided: brochure;handout;video Self management tools provided:   Other plans: Continue to urge patient to consider ACE-I

## 2013-08-16 NOTE — Patient Instructions (Addendum)
General Instructions: Your diabetes is very well controlled, congratulations!!  Your Hemoglobin A1C is 6.2 today.  For your Hypothyroidism, we are increasing your dose of Synthroid from 25 mcg to 50 mcg, 1 tablet once per day.  If you experience symptoms such as heart racing, "jitteryness", difficulty concentrating, or difficulty sleeping, that may be an indication that this dose is too high.  We will recheck your thyroid blood work at your next visit.  We are referring you to Gastroenterology, for further evaluation of your difficulty swallowing, and to evaluate you for Diabetic Gastroparesis.  Controlling your blood pressure is the next best thing we can do for your health.  We can continue to discuss this at future visits.  Please return for a follow-up visit in 3 months.   Treatment Goals:  Goals (1 Years of Data) as of 08/16/13         As of Today 08/02/13 07/06/13 06/22/13 06/22/13     Blood Pressure    . Blood Pressure < 140/90  138/91 140/93 145/94 132/80 130/74     Result Component    . HEMOGLOBIN A1C < 7.0  6.2        . LDL CALC < 100            Progress Toward Treatment Goals:  Treatment Goal 08/16/2013  Hemoglobin A1C at goal  Blood pressure unchanged  Prevent falls at goal    Self Care Goals & Plans:  Self Care Goal 08/16/2013  Manage my medications take my medicines as prescribed; bring my medications to every visit; refill my medications on time; follow the sick day instructions if I am sick  Monitor my health keep track of my blood glucose; bring my glucose meter and log to each visit; keep track of my blood pressure; keep track of my weight; check my feet daily  Eat healthy foods eat more vegetables; eat fruit for snacks and desserts; eat foods that are low in salt; eat baked foods instead of fried foods; eat smaller portions; drink diet soda or water instead of juice or soda  Be physically active take a walk every day; find an activity I enjoy  Prevent falls -  Other  -    Home Blood Glucose Monitoring 08/16/2013  Check my blood sugar 3 times a day  When to check my blood sugar before meals     Care Management & Community Referrals:  Referral 08/16/2013  Referrals made for care management support none needed

## 2013-08-16 NOTE — Assessment & Plan Note (Signed)
The patient's last thyroid function tests still revealed borderline hypothyroidism.  Symptomatically, the patient continues to note symptoms of hypothyroidism. -will increase synthroid from 25 to 50 mcg (next available dosage). -Patient counseled on symptoms of hyperthyroidism.  If 50 mcg produces these symptoms, we can try 37.5 mcg (no available pill at this strength, based on what's available through Epic, but could try cutting a 75 mcg tablet in half) -recommend thyroid ultrasound; pt states she can obtain these through her work for free, will follow-up

## 2013-08-17 NOTE — Progress Notes (Signed)
Case discussed with Dr. Brown soon after the resident saw the patient.  We reviewed the resident's history and exam and pertinent patient test results.  I agree with the assessment, diagnosis, and plan of care documented in the resident's note. 

## 2013-08-17 NOTE — Addendum Note (Signed)
Addended by: Linward Headland on: 08/17/2013 02:55 PM   Modules accepted: Orders

## 2013-08-18 LAB — LDL CHOLESTEROL, DIRECT: Direct LDL: 116 mg/dL — ABNORMAL HIGH

## 2013-08-18 NOTE — Progress Notes (Signed)
Medical Nutrition Therapy:  Appt start time: 1605 end time:  1635. Session 3 Assessment:  Primary concerns today:Blood sugar control and Diabetes self care, Meal planning.  Blood sugars; A1C improved to 6.2%, LDL 116 noted.  She is pleased her progress and no change in Weight. Usual eating pattern includes 2- 3 meals and some snacks. Skips lunch sometimes.  Keeping carbs and calories in line with her goals. Testing in pairs shows post-meal often lower than pre-meal. Reports continued problems with diarrhea/constipation. Estimated calorie needs ~ 2000 /day, 400-600/meal, 100-200/snack Thinking about increasing her physical activity.  Progress Towards Goal(s):  Some progress.   Nutritional Diagnosis:  NB-1.1 Food and nutrition-related knowledge deficit As related to lack of prior exposure to meal planning improving  As evidenced by her report and questions.    Intervention:  Nutrition counseling about importance of physical activity for blood sugar control. Nutrition education about meter download, complications of diabetes and side effects of her current medications.   Monitoring/Evaluation:  Dietary intake, exercise, blood sugars, and body weight in 3 month(s).

## 2013-08-24 ENCOUNTER — Encounter: Payer: Self-pay | Admitting: Internal Medicine

## 2013-09-11 NOTE — Addendum Note (Signed)
Addended by: Bufford Spikes on: 09/11/2013 03:36 PM   Modules accepted: Orders

## 2013-09-27 ENCOUNTER — Ambulatory Visit (INDEPENDENT_AMBULATORY_CARE_PROVIDER_SITE_OTHER): Payer: BC Managed Care – PPO | Admitting: Internal Medicine

## 2013-09-27 ENCOUNTER — Other Ambulatory Visit (INDEPENDENT_AMBULATORY_CARE_PROVIDER_SITE_OTHER): Payer: BC Managed Care – PPO

## 2013-09-27 ENCOUNTER — Encounter: Payer: Self-pay | Admitting: Internal Medicine

## 2013-09-27 VITALS — BP 128/80 | HR 84 | Ht 65.0 in | Wt 191.0 lb

## 2013-09-27 DIAGNOSIS — K219 Gastro-esophageal reflux disease without esophagitis: Secondary | ICD-10-CM

## 2013-09-27 DIAGNOSIS — E119 Type 2 diabetes mellitus without complications: Secondary | ICD-10-CM

## 2013-09-27 DIAGNOSIS — R198 Other specified symptoms and signs involving the digestive system and abdomen: Secondary | ICD-10-CM

## 2013-09-27 DIAGNOSIS — Z1211 Encounter for screening for malignant neoplasm of colon: Secondary | ICD-10-CM

## 2013-09-27 DIAGNOSIS — R1310 Dysphagia, unspecified: Secondary | ICD-10-CM

## 2013-09-27 DIAGNOSIS — R197 Diarrhea, unspecified: Secondary | ICD-10-CM

## 2013-09-27 MED ORDER — MOVIPREP 100 G PO SOLR: 1.0000 | Freq: Once | ORAL | Status: DC

## 2013-09-27 MED ORDER — OMEPRAZOLE 40 MG PO CPDR: 40.0000 mg | DELAYED_RELEASE_CAPSULE | Freq: Every day | ORAL | Status: DC

## 2013-09-27 NOTE — Progress Notes (Signed)
HISTORY OF PRESENT ILLNESS:  Sophia Cox is a 67 y.o. female with multiple medical problems as listed below. She presents today for evaluation of chronic GI complaints. The patient has a 1213 year history of type 2 diabetes mellitus. Previously uncontrolled, but better with current medical regimen. She has several GI complaints. First, uncontrolled diarrhea with occasional incontinence which she has had for several years, at least. This alternating with bouts of constipation. She does not use laxatives or antidiarrheals. She has been on metformin for about 6 months. This didn't worsen her problems with loose stools. She does have occasional cramping, though this is not overwhelming. She does not wear protective undergarments. Next, chronic classic reflux symptoms as manifested by indigestion and regurgitation. She does not take medical therapy. As well several year history of what sounds like intermittent solid food dysphagia as well as globus type sensation. She is status post cholecystectomy about 20 years ago  REVIEW OF SYSTEMS:  All non-GI ROS negative except for antritis  Past Medical History  Diagnosis Date  . Hypertension   . Supraventricular tachycardia   . Pneumonia [phone]    "quit a few times" (06/21/2013)  . Hypothyroidism   . Type II diabetes mellitus   . NWGNFAOZ(308.6)     "weekly" (06/21/2013)  . Arthritis     "left knee" (06/21/2013)    Past Surgical History  Procedure Laterality Date  . Supraventricular tachycardia ablation  06/21/2013  . Tonsillectomy  ~ 1952  . Cholecystectomy  ~ 1989  . Tubal ligation  1971    Social History Jannelle Notaro  reports that she has never smoked. She has never used smokeless tobacco. She reports that she drinks about 1.8 ounces of alcohol per week. She reports that she does not use illicit drugs.  family history includes Diabetes in her father; Lung cancer in her mother; Stroke in her father.  Allergies  Allergen  Reactions  . Eggs Or Egg-Derived Products Diarrhea  . Penicillins Other (See Comments)    Childhood reaction - hemorrhaged       PHYSICAL EXAMINATION: Vital signs: BP 128/80  Pulse 84  Ht 5\' 5"  (1.651 m)  Wt 191 lb (86.637 kg)  BMI 31.78 kg/m2  SpO2 98%  Constitutional: generally well-appearing, no acute distress Psychiatric: alert and oriented x3, cooperative Eyes: extraocular movements intact, anicteric, conjunctiva pink Mouth: oral pharynx moist, no lesions Neck: supple no lymphadenopathy Cardiovascular: heart regular rate and rhythm, no murmur Lungs: clear to auscultation bilaterally Abdomen: soft, obese, nontender, nondistended, no obvious ascites, no peritoneal signs, normal bowel sounds, no organomegaly. Prior surgical incisions well-healed Rectal: Deferred until colonoscopy Extremities: no lower extremity edema bilaterally Skin: no lesions on visible extremities Neuro: No focal deficits. No asterixis.    ASSESSMENT:  #1. Chronic alternating bowel habits. Sounds like irritable bowel syndrome. Could be related to diabetes and medications. Rule out other causes such as celiac disease #2. GERD. Significant symptoms. Not on medical therapy #3. Intermittent solid food dysphagia. Rule out peptic stricture #4. Colon cancer screening. Has not had. Appropriate candidate without contraindication   PLAN:  #1. Reflux precautions. Advised. Literature provided for review #2. Prescribe omeprazole 40 mg daily. #3. Celiac panel #4. Upper endoscopy with possible esophageal dilation.The nature of the procedure, as well as the risks, benefits, and alternatives were carefully and thoroughly reviewed with the patient. Ample time for discussion and questions allowed. The patient understood, was satisfied, and agreed to proceed. #5. Colonoscopy for colorectal screening and to evaluate lower GI complaints.The  nature of the procedure, as well as the risks, benefits, and alternatives were  carefully and thoroughly reviewed with the patient. Ample time for discussion and questions allowed. The patient understood, was satisfied, and agreed to proceed. #6. Movi prep prescribed. Patient instructed on its use #7. Hold diabetic medications the day of the examination to avoid in wanted hypoglycemia #8. Ongoing general medical care with PCP.

## 2013-09-27 NOTE — Patient Instructions (Signed)
Your physician has requested that you go to the basement for the following lab work before leaving today:  TTG, IGA   We have sent the following medications to your pharmacy for you to pick up at your convenience:  Omeprazole  You have been scheduled for an endoscopy and colonoscopy with propofol. Please follow the written instructions given to you at your visit today. Please pick up your prep at the pharmacy within the next 1-3 days. If you use inhalers (even only as needed), please bring them with you on the day of your procedure. Your physician has requested that you go to www.startemmi.com and enter the access code given to you at your visit today. This web site gives a general overview about your procedure. However, you should still follow specific instructions given to you by our office regarding your preparation for the procedure.  You have been given some information on Gerd.

## 2013-10-02 ENCOUNTER — Encounter: Payer: Self-pay | Admitting: Internal Medicine

## 2013-10-19 ENCOUNTER — Other Ambulatory Visit: Payer: Self-pay

## 2013-10-23 ENCOUNTER — Encounter: Payer: Self-pay | Admitting: Internal Medicine

## 2013-10-23 ENCOUNTER — Ambulatory Visit (AMBULATORY_SURGERY_CENTER): Payer: BC Managed Care – PPO | Admitting: Internal Medicine

## 2013-10-23 VITALS — BP 142/89 | HR 67 | Temp 98.0°F | Resp 19 | Ht 65.0 in | Wt 191.0 lb

## 2013-10-23 DIAGNOSIS — R198 Other specified symptoms and signs involving the digestive system and abdomen: Secondary | ICD-10-CM

## 2013-10-23 DIAGNOSIS — D126 Benign neoplasm of colon, unspecified: Secondary | ICD-10-CM

## 2013-10-23 DIAGNOSIS — R1310 Dysphagia, unspecified: Secondary | ICD-10-CM

## 2013-10-23 DIAGNOSIS — K219 Gastro-esophageal reflux disease without esophagitis: Secondary | ICD-10-CM

## 2013-10-23 DIAGNOSIS — Z1211 Encounter for screening for malignant neoplasm of colon: Secondary | ICD-10-CM

## 2013-10-23 LAB — GLUCOSE, CAPILLARY: Glucose-Capillary: 97 mg/dL (ref 70–99)

## 2013-10-23 MED ORDER — SODIUM CHLORIDE 0.9 % IV SOLN: 500.0000 mL | INTRAVENOUS | Status: DC

## 2013-10-23 NOTE — Progress Notes (Signed)
Procedure ends, to recovery, report given and VSS. 

## 2013-10-23 NOTE — Op Note (Signed)
[facility] [address] [place], [address]   COLONOSCOPY PROCEDURE REPORT  PATIENT: Sophia Cox, Sophia Cox  MR#: [id number] BIRTHDATE: 02-13-1946 , 67  yrs. old GENDER: Female ENDOSCOPIST: Roxy Cedar, MD REFERRED YQ:[id number] Manson Passey, M.D. PROCEDURE DATE:  10/23/2013 PROCEDURE:   Colonoscopy with snare polypectomy x 3 First Screening Colonoscopy - Avg.  risk and is 50 yrs.  old or older Yes.  Prior Negative Screening - Now for repeat screening. N/A  History of Adenoma - Now for follow-up colonoscopy & has been > or = to 3 yrs.  N/A  Polyps Removed Today? Yes. ASA CLASS:   Class III INDICATIONS:average risk screening. MEDICATIONS: MAC sedation, administered by CRNA and propofol (Diprivan) 360mg  IV DESCRIPTION OF PROCEDURE:   After the risks benefits and alternatives of the procedure were thoroughly explained, informed consent was obtained.  A digital rectal exam revealed no abnormalities of the rectum.   The LB [id number]  endoscope was introduced through the anus and advanced to the cecum, which was identified by both the appendix and ileocecal valve. No adverse events experienced.   The quality of the prep was good, using MoviPrep  The instrument was then slowly withdrawn as the colon was fully examined.  COLON FINDINGS: Three polyps ranging between 3-8mm in size were found in the ascending colon and sigmoid colon (2).  A polypectomy was performed with a cold snare.  The resection was complete and the polyp tissue was completely retrieved.   Moderate diverticulosis was noted The finding was in the right colon and The finding was left colon.   The colon mucosa was otherwise normal. Retroflexed views revealed no abnormalities. The time to cecum=5 minutes 04 seconds.  Withdrawal time=13 minutes 54 seconds.  The scope was withdrawn and the procedure completed. COMPLICATIONS: There were no complications.  ENDOSCOPIC IMPRESSION: 1.   Three polyps were  found in the colon; polypectomy was performed with a cold snare 2.   Moderate diverticulosis was noted in the right colon and left colon 3.   The colon mucosa was otherwise normal  RECOMMENDATIONS: 1.  Repeat colonoscopy in 5 years if polyp adenomatous; otherwise 10 years 2.  Upper endoscopy today (see report)   eSigned:  Roxy Cedar, MD 10/23/2013 3:31 PM   cc: The Patient and  Janalyn Harder, MD

## 2013-10-23 NOTE — Patient Instructions (Addendum)

## 2013-10-23 NOTE — Progress Notes (Signed)
AMT OF D5W ABSORBED ENTERED INCORRECTLY,ONLY 450CC RECEIVED

## 2013-10-23 NOTE — Op Note (Addendum)
[facility] [address] [place], [address]   ENDOSCOPY PROCEDURE REPORT  PATIENT: Sophia Cox, Sophia Cox  MR#: [id number] BIRTHDATE: 02-Dec-1946 , 67  yrs. old GENDER: Female ENDOSCOPIST: Roxy Cedar, MD REFERRED BY:  Tressie Ellis, M.D. PROCEDURE DATE:  10/23/2013 PROCEDURE:  EGD, diagnostic ASA CLASS:     Class III INDICATIONS:  History of esophageal reflux.   Dysphagia. MEDICATIONS: MAC sedation, administered by CRNA and propofol (Diprivan) 150mg  IV TOPICAL ANESTHETIC: Cetacaine Spray  DESCRIPTION OF PROCEDURE: After the risks benefits and alternatives of the procedure were thoroughly explained, informed consent was obtained.  The LB [id number] endoscope was introduced through the mouth and advanced to the second portion of the duodenum. Without limitations.  The instrument was slowly withdrawn as the mucosa was fully examined.      EXAM: The upper, middle and distal third of the esophagus were carefully inspected and no abnormalities were noted.  The z-line was well seen at the GEJ.  The endoscope was pushed into the fundus which was normal including a retroflexed view.  The antrum, gastric body, first and second part of the duodenum were unremarkable. Retroflexed views revealed a hiatal hernia.     The scope was then withdrawn from the patient and the procedure completed.  COMPLICATIONS: There were no complications. ENDOSCOPIC IMPRESSION: 1. Normal EGD 2. GERD  RECOMMENDATIONS: 1.  Anti-reflux regimen to be followed 2.  Continue PPI  REPEAT EXAM:  eSigned:  Roxy Cedar, MD 10/23/2013 3:37 PM   CC:The Patient and  Janalyn Harder, MD

## 2013-10-23 NOTE — Progress Notes (Signed)
Patient did not experience any of the following events: a burn prior to discharge; a fall within the facility; wrong site/side/patient/procedure/implant event; or a hospital transfer or hospital admission upon discharge from the facility. (G8907) Patient did not have preoperative order for IV antibiotic SSI prophylaxis. (G8918)  

## 2013-10-23 NOTE — Progress Notes (Signed)
Called to room to assist during endoscopic procedure.  Patient ID and intended procedure confirmed with present staff. Received instructions for my participation in the procedure from the performing physician.  

## 2013-10-24 ENCOUNTER — Telehealth: Payer: Self-pay | Admitting: *Deleted

## 2013-10-24 NOTE — Telephone Encounter (Signed)
  Follow up Call-  Call back number 10/23/2013  Post procedure Call Back phone  # [phone]  Permission to leave phone message Yes     Patient questions:  Do you have a fever, pain , or abdominal swelling? no Pain Score  0   Have you tolerated food without any problems? yes  Have you been able to return to your normal activities? yes  Do you have any questions about your discharge instructions: Diet   no Medications  no Follow up visit  no  Do you have questions or concerns about your Care? no  Actions: * If pain score is 4 or above: No action needed, pain <4.

## 2013-10-25 ENCOUNTER — Encounter: Payer: BC Managed Care – PPO | Admitting: Internal Medicine

## 2013-10-25 ENCOUNTER — Encounter: Payer: BC Managed Care – PPO | Admitting: Dietician

## 2013-11-01 ENCOUNTER — Encounter: Payer: Self-pay | Admitting: Internal Medicine

## 2013-11-22 ENCOUNTER — Encounter: Payer: BC Managed Care – PPO | Admitting: Dietician

## 2013-11-22 ENCOUNTER — Encounter: Payer: BC Managed Care – PPO | Admitting: Internal Medicine

## 2013-12-04 ENCOUNTER — Telehealth: Payer: Self-pay | Admitting: Dietician

## 2013-12-04 ENCOUNTER — Encounter: Payer: Self-pay | Admitting: Internal Medicine

## 2013-12-04 ENCOUNTER — Ambulatory Visit (INDEPENDENT_AMBULATORY_CARE_PROVIDER_SITE_OTHER): Payer: BC Managed Care – PPO | Admitting: Dietician

## 2013-12-04 ENCOUNTER — Ambulatory Visit (INDEPENDENT_AMBULATORY_CARE_PROVIDER_SITE_OTHER): Payer: BC Managed Care – PPO | Admitting: Internal Medicine

## 2013-12-04 VITALS — BP 134/85 | HR 85 | Temp 97.0°F | Ht 65.0 in | Wt 190.5 lb

## 2013-12-04 VITALS — Wt 190.5 lb

## 2013-12-04 DIAGNOSIS — Z0000 Encounter for general adult medical examination without abnormal findings: Secondary | ICD-10-CM

## 2013-12-04 DIAGNOSIS — R1310 Dysphagia, unspecified: Secondary | ICD-10-CM

## 2013-12-04 DIAGNOSIS — E119 Type 2 diabetes mellitus without complications: Secondary | ICD-10-CM

## 2013-12-04 DIAGNOSIS — E039 Hypothyroidism, unspecified: Secondary | ICD-10-CM

## 2013-12-04 LAB — GLUCOSE, CAPILLARY: Glucose-Capillary: 113 mg/dL — ABNORMAL HIGH (ref 70–99)

## 2013-12-04 MED ORDER — GLUCOSE BLOOD VI STRP: ORAL_STRIP | Status: DC

## 2013-12-04 NOTE — Telephone Encounter (Signed)
[name] saw Dr. Sondra Barges for an eye exam on  05/09/2013 .  She is supposed to follow up in 6 months and is requesting a new eye doctor. Results of 04/2013 exam being faxed to your attention.

## 2013-12-04 NOTE — Progress Notes (Signed)
Case discussed with Dr. Brown at the time of the visit.  We reviewed the resident's history and exam and pertinent patient test results.  I agree with the assessment, diagnosis and plan of care documented in the resident's note. 

## 2013-12-04 NOTE — Progress Notes (Signed)
HPI The patient is a 67 y.o. female with a history of DM, HTN, hypothyroidism, presenting for a follow-up visit for diabetes.  The patient has a history of DM.  A1C has decreased from 6.2 to 5.5.  Weight stable from 191 to 190 lbs.  Glucometer report reveals an average of 169, with a range from 117 to 207, and about 10 readings for the month.  No hypothyroidism.  The patient has a history of hypothyroidism.  At her last visit, synthroid was increased from 25 to 50 mcg daily.  The patient notes mild cold intolerance, both diarrhea and constipation (history of IBS, per GI), but no hair/nail changes, change in appetite.  The patient noted symptoms of dysphasia at our last visit. She was seen by GI, and endoscopy was normal.  She also underwent thyroid US at [facility], which revealed a large thyroid.  The patient still notes dysphagia.  ROS: General: no fevers, chills, changes in weight, changes in appetite Skin: no rash HEENT: no blurry vision, hearing changes, sore throat Pulm: no dyspnea, coughing, wheezing CV: no chest pain, palpitations, shortness of breath Abd: no abdominal pain, nausea/vomiting, diarrhea/constipation GU: no dysuria, hematuria, polyuria Ext: no arthralgias, myalgias Neuro: no weakness, numbness, or tingling  Filed Vitals:   12/04/13 1043  BP: 134/85  Pulse: 85  Temp: 97 F (36.1 C)    PEX General: alert, cooperative, and in no apparent distress HEENT: pupils equal round and reactive to light, vision grossly intact, oropharynx clear and non-erythematous  Neck: supple Lungs: clear to ascultation bilaterally, normal work of respiration, no wheezes, rales, ronchi Heart: regular rate and rhythm, no murmurs, gallops, or rubs Abdomen: soft, non-tender, non-distended, normal bowel sounds Extremities: no cyanosis, clubbing, or edema Neurologic: alert & oriented X3, cranial nerves II-XII intact, strength grossly intact, sensation intact to light touch  Current Outpatient  Prescriptions on File Prior to Visit  Medication Sig Dispense Refill  . BD PEN NEEDLE NANO U/F 32G X 4 MM MISC       . Blood Glucose Monitoring Suppl (BAYER CONTOUR MONITOR) W/DEVICE KIT 1 each by Does not apply route 2 (two) times daily at 10 AM and 5 PM.  1 kit  0  . exenatide (BYETTA) 10 MCG/0.04ML SOPN injection Inject into the skin. Inject 10 mcg into the skin 2 times daily with a meal      . glucose blood test strip Use to test blood glucose 4 times daily. Dx: 250.00  100 each  12  . levothyroxine (LEVOTHROID) 50 MCG tablet Take 1 tablet (50 mcg total) by mouth daily.  30 tablet  11  . metFORMIN (GLUCOPHAGE) 1000 MG tablet Take 1 tablet (1,000 mg total) by mouth 2 (two) times daily with a meal.  60 tablet  11  . Multiple Vitamins-Minerals (PRESERVISION AREDS PO) Take by mouth.      Marland Kitchen omeprazole (PRILOSEC) 40 MG capsule Take 1 capsule (40 mg total) by mouth daily.  30 capsule  11   No current facility-administered medications on file prior to visit.    Assessment/Plan

## 2013-12-04 NOTE — Assessment & Plan Note (Signed)
-  refused flu shot, pneumovax

## 2013-12-04 NOTE — Progress Notes (Signed)
Medical Nutrition Therapy:  Appt start time: 0938 end time:  1030. Session 4 Assessment:  Primary concerns today:Blood sugar control and Diabetes self care, Meal planning.  Blood sugars; A1C improved to 5.5% today and weighy continue to gradual fall. She is tolerating slight nausea she gets with Byetta.  She is pleased her progress, and somewhat disappointment in weight loss, but is deternined to reach ~ 180#.Marland Kitchen Not interested in calorie tracking or further nutrition intervention for diabetes. Gave her information on FODMAPS for IBS with understanding that there is currently little evidence behind it and that it's intention is to help her identify trigger foods/chemicals. . Thinking about increasing her physical activity. Meter download average ~ 160 mg/dl, 10 checks on past month.   Progress Towards Goal(s):  Some progress.   Nutritional Diagnosis:  NB-1.1 Food and nutrition-related knowledge deficit As related to lack of prior exposure to meal planning improving  As evidenced by her report and questions.    Intervention:  Nutrition counseling about acute and chronic complications, using weekly or monthly profile for self monitoring rather than 1 check daily to increase information gleaned and decrease the burden of diabetes, Medicare benefits covered- including diabetes self management classes, foot care.  Coordination of care- discussed the above with Dr. Manson Passey  Monitoring/Evaluation:  Dietary intake, exercise, blood sugars, and body weight in 6 month(s).

## 2013-12-04 NOTE — Telephone Encounter (Signed)
Perfect, thank you for looking into this!

## 2013-12-04 NOTE — Patient Instructions (Addendum)
Please make a follow up appointment with Lupita Leash for 6 months.  General Instructions: Your diabetes control is excellent!!  Congratulations on your A1C of 5.5!  For your thyroid, we are checking your thyroid levels today.  You enlarged thyroid may be playing a role in your difficulty swallowing.  We will reassess at your next visit, after a trial of synthroid to reduce its size.  Please return for a follow-up visit in 6 months.   Treatment Goals:  Goals (1 Years of Data) as of 12/04/13         As of Today 10/23/13 10/23/13 10/23/13 10/23/13     Blood Pressure    . Blood Pressure < 140/90  134/85 142/89 134/79 134/79 126/87     Result Component    . HEMOGLOBIN A1C < 7.0  5.5        . LDL CALC < 100            Progress Toward Treatment Goals:  Treatment Goal 12/04/2013  Hemoglobin A1C at goal  Blood pressure at goal  Prevent falls at goal    Self Care Goals & Plans:  Self Care Goal 12/04/2013  Manage my medications take my medicines as prescribed; bring my medications to every visit; refill my medications on time  Monitor my health keep track of my blood glucose; bring my glucose meter and log to each visit  Eat healthy foods eat more vegetables; eat foods that are low in salt; eat baked foods instead of fried foods  Be physically active -  Prevent falls use home fall prevention checklist to improve safety  Other -    Home Blood Glucose Monitoring 12/04/2013  Check my blood sugar once a day  When to check my blood sugar -     Care Management & Community Referrals:  Referral 12/04/2013  Referrals made for care management support none needed

## 2013-12-04 NOTE — Assessment & Plan Note (Signed)
Lab Results  Component Value Date   HGBA1C 5.5 12/04/2013   HGBA1C 6.2 08/16/2013   HGBA1C 11.4 05/03/2013     Assessment: Diabetes control: good control (HgbA1C at goal) Progress toward A1C goal:  at goal Comments:  A1c has improved remarkably to 5.5, with metformin and Byetta. We will continue current treatment. In the future, we can consider decreasing Byetta dose, but for now, with no hypoglycemia, and her glucometer report that shows an average of 169, I prefer to continue the current medications.  Plan: Medications:  Continue metformin 1000 mg twice per day and Byetta 10 mg twice per day Home glucose monitoring: Frequency: once a day Timing:   Instruction/counseling given: reminded to bring blood glucose meter & log to each visit Educational resources provided:   Self management tools provided:   Other plans: Pt saw Lupita Leash Plyler today, will call for results of eye exam.  Pt continues to decline ACE-I, despite history of microalbuminuria.  Patient continues to decline statin.

## 2013-12-04 NOTE — Patient Instructions (Signed)
YOU ARE DOING WONDERFUL AT CARING FOR YOUR DIABETES!!!  Information about Medicare and diabetes is on pages 46-47, 49, 50 and 85 of the booklet found at the link below.  [email or web address]  Please make a follow up with Lupita Leash in 6 months or sooner if desired. Nutrition is covered at 100% under Medicare- you get at least 3 hours your first year and 2 hours every year thereafter.   Happy Farming with your animals and Happy Holidays, too!  Take care,  Lupita Leash

## 2013-12-04 NOTE — Assessment & Plan Note (Signed)
Asymptomatic.  Synthroid increased to 50 mcg from 25 mcg at our last visit 3 months ago. -check TSH

## 2013-12-04 NOTE — Assessment & Plan Note (Signed)
Unclear etiology.  GI work-up included EGD, which was normal.  Thyroid US showed enlarged thyroid; it's unclear whether this could play a role. -will call for full report of thyroid US -using synthroid for hypothyroidism, hopefully this will reduce thyroid size by suppressing TSH

## 2013-12-05 LAB — TSH: TSH: 3.411 u[IU]/mL (ref 0.350–4.500)

## 2014-03-08 ENCOUNTER — Other Ambulatory Visit: Payer: Self-pay | Admitting: *Deleted

## 2014-03-09 MED ORDER — EXENATIDE 10 MCG/0.04ML ~~LOC~~ SOPN
10.0000 ug | PEN_INJECTOR | Freq: Two times a day (BID) | SUBCUTANEOUS | Status: DC
Start: ? — End: 2014-11-14

## 2014-03-22 ENCOUNTER — Other Ambulatory Visit: Payer: Self-pay

## 2014-04-04 ENCOUNTER — Emergency Department (HOSPITAL_COMMUNITY)
Admission: EM | Admit: 2014-04-04 | Discharge: 2014-04-04 | Disposition: A | Discharge status: Home / Self Care | Payer: BC Managed Care – PPO | Attending: Emergency Medicine | Admitting: Emergency Medicine

## 2014-04-04 ENCOUNTER — Encounter (HOSPITAL_COMMUNITY): Payer: Self-pay | Admitting: Emergency Medicine

## 2014-04-04 DIAGNOSIS — Z8739 Personal history of other diseases of the musculoskeletal system and connective tissue: Secondary | ICD-10-CM | POA: Insufficient documentation

## 2014-04-04 DIAGNOSIS — I10 Essential (primary) hypertension: Secondary | ICD-10-CM | POA: Insufficient documentation

## 2014-04-04 DIAGNOSIS — H43391 Other vitreous opacities, right eye: Secondary | ICD-10-CM

## 2014-04-04 DIAGNOSIS — Z79899 Other long term (current) drug therapy: Secondary | ICD-10-CM | POA: Insufficient documentation

## 2014-04-04 DIAGNOSIS — H43399 Other vitreous opacities, unspecified eye: Secondary | ICD-10-CM | POA: Insufficient documentation

## 2014-04-04 DIAGNOSIS — E039 Hypothyroidism, unspecified: Secondary | ICD-10-CM | POA: Insufficient documentation

## 2014-04-04 DIAGNOSIS — E119 Type 2 diabetes mellitus without complications: Secondary | ICD-10-CM | POA: Insufficient documentation

## 2014-04-04 DIAGNOSIS — Z8701 Personal history of pneumonia (recurrent): Secondary | ICD-10-CM | POA: Insufficient documentation

## 2014-04-04 DIAGNOSIS — Z880 Allergy status to penicillin: Secondary | ICD-10-CM | POA: Insufficient documentation

## 2014-04-04 NOTE — ED Provider Notes (Addendum)
CSN: [id number]     Arrival date & time 04/04/14  1620 History   First MD Initiated Contact with Patient 04/04/14 1643     Chief Complaint  Patient presents with  . Eye Problem     (Consider location/radiation/quality/duration/timing/severity/associated sxs/prior Treatment) Patient is a 68 y.o. female presenting with eye problem.  Eye Problem  Pt with history of macular degeneration, no longer has an ophthalmologist reports seeing flashes of light in her R peripheral vision several days ago which improved until today when she noticed a large black floater in her R eye. States looked like black lace extending across her field of vision. Since then it has improved but still seeing the floater from time to time. Denies any pain, vision is blurry due to the floater but otherwise unchanged from baseline. No drainage from eye or trauma.  Past Medical History  Diagnosis Date  . Hypertension   . Supraventricular tachycardia   . Pneumonia [id number]    "quit a few times" (06/21/2013)  . Hypothyroidism   . Type II diabetes mellitus   . GYIRSWNI(627.0)     "weekly" (06/21/2013)  . Arthritis     "left knee" (06/21/2013)   Past Surgical History  Procedure Laterality Date  . Supraventricular tachycardia ablation  06/21/2013  . Tonsillectomy  ~ 1952  . Cholecystectomy  ~ 1989  . Tubal ligation  1971   Family History  Problem Relation Age of Onset  . Lung cancer Mother   . Stroke Father   . Diabetes Father   . Colon cancer Neg Hx   . Esophageal cancer Neg Hx   . Stomach cancer Neg Hx    History  Substance Use Topics  . Smoking status: Never Smoker   . Smokeless tobacco: Never Used  . Alcohol Use: 1.8 oz/week    3 Glasses of wine per week   OB History   Grav Para Term Preterm Abortions TAB SAB Ect Mult Living                 Review of Systems All other systems reviewed and are negative except as noted in HPI.     Allergies  Eggs or egg-derived products and Penicillins  Home  Medications   Prior to Admission medications   Medication Sig Start Date End Date Taking? Authorizing Provider  exenatide (BYETTA) 10 MCG/0.04ML SOPN injection Inject 0.04 mLs (10 mcg total) into the skin 2 (two) times daily with a meal. Inject 10 mcg into the skin 2 times daily with a meal   Yes Hester Mates, MD  levothyroxine (LEVOTHROID) 50 MCG tablet Take 1 tablet (50 mcg total) by mouth daily. 08/16/13 08/16/14 Yes Hester Mates, MD  metFORMIN (GLUCOPHAGE) 1000 MG tablet Take 1 tablet (1,000 mg total) by mouth 2 (two) times daily with a meal. 07/06/13  Yes Hester Mates, MD  omeprazole (PRILOSEC) 40 MG capsule Take 1 capsule (40 mg total) by mouth daily. 09/27/13  Yes Irene Shipper, MD  BD PEN NEEDLE NANO U/F 32G X 4 MM MISC  07/19/13   Historical Provider, MD  Blood Glucose Monitoring Suppl (BAYER CONTOUR MONITOR) W/DEVICE KIT 1 each by Does not apply route 2 (two) times daily at 10 AM and 5 PM. 07/06/13   Bartholomew Crews, MD  glucose blood test strip Use to test blood glucose 4 times daily. Dx: 250.00 12/04/13   Hester Mates, MD   BP 165/88  Pulse 100  Temp(Src) 98.1 F (36.7 C) (  Oral)  SpO2 95% Physical Exam  Nursing note and vitals reviewed. Constitutional: She is oriented to person, place, and time. She appears well-developed and well-nourished.  HENT:  Head: Normocephalic and atraumatic.  Eyes: EOM are normal. Pupils are equal, round, and reactive to light.  Fundoscopic exam limited by undilated eye and direct ophthalomoscopy only, there is a large black floater in R eye but no discernible retinal detachment, otherwise the fundus is normal appearing, L eye is normal; anterior chambers are clear  Neck: Normal range of motion. Neck supple.  Cardiovascular: Normal rate, normal heart sounds and intact distal pulses.   Pulmonary/Chest: Effort normal and breath sounds normal.  Abdominal: Bowel sounds are normal. She exhibits no distension. There is no tenderness.  Musculoskeletal: Normal  range of motion. She exhibits no edema and no tenderness.  Neurological: She is alert and oriented to person, place, and time. She has normal strength. No cranial nerve deficit or sensory deficit.  Skin: Skin is warm and dry. No rash noted.  Psychiatric: She has a normal mood and affect.    ED Course  Procedures (including critical care time) Labs Review Labs Reviewed - No data to display  Imaging Review No results found.   EKG Interpretation None      MDM   Final diagnoses:  Vitreous floaters of right eye    Will discuss with On call Ophtho regarding followup, suspect she has a small peripheral retinal tear I cannot see within the limitation of the ED.   After multiple attempts, I was unsuccessful at reaching, Dr. Frederico Hamman the on-call Ophthalmologist. The patient would like to go home and attempt to arrange an appointment tomorrow morning. I think this is a reasonable plan, I will give her the number to several clinics in town in case she has difficulty in getting in to see Dr. Frederico Hamman in a timely fashion. She was advised to return to the ED for any worsening symptoms or if she is unable to get through to an eye doctor tomorrow.    Johnae Friley B. Karle Starch, MD 04/04/14 1940  Dr Frederico Hamman called back after the patient had been discharged. He has agreed to see her in the AM.  Juanda Crumble B. Karle Starch, MD 04/05/14 [phone]

## 2014-04-04 NOTE — ED Notes (Signed)
Pt A&Ox4, ambulatory with slow steady gait at discharge, verbalizing no complaints at this time.

## 2014-04-04 NOTE — Discharge Instructions (Signed)
Eye Floaters A jelly-like fluid fills the inside of the eye and is called the vitreous. The vitreous is normally clear. It allows light to pass through to the back of the eye to the tissues that contain the nerves needed for vision (the retina). With age, the vitreous can start to decline. If a decline happens, specks of material from clumps of cells, blood, or other materials may start to float around inside the eye. These objects cast shadows on the retina. These shadows are seen as moving strings, streaks, "bugs," dust or spider webs floating in front of the eye. CAUSES   Age.  A high degree of near-sightedness (high myopia).  Tears in the retina.  Bleeding inside the eye from broken retinal blood vessels as a result of disease (diabetes, inflammation of the retinal blood vessels, and others).  Blood clot of the major vein of the retina or its branches (retinal vein occlusion).  Trauma.  Retinal detachment.  Vitreous detachment.  Eye surgery.  Inflammation inside the eye (uveitis).  Infection inside the eye. SYMPTOMS   Seeing floating specs, dots or spider webs in the vision of one eye. This can sometimes be associated with flashes of light seen off to the side.  Bleeding in the eye may begin as floaters and lead to complete vision loss as the vitreous fills with blood. This may happen repeatedly in certain diseases of the blood vessels of the retina (e.g. diabetes).  If the vitreous shrinks enough to pull away from the retina (posterior vitreous detachment), a small circular ring-shaped floater may be seen. Migraine headaches may be associated with many forms of visual symptoms (sparkling dots, wavy lines) just before the headache strikes. These symptoms due to migraine are not from floaters. They will disappear when the headache goes away. DIAGNOSIS  An eye professional can tell you if you have floaters during an eye exam. TREATMENT  There is no treatment for the floaters  themselves.  If the floaters are due to a tear in the retina, a retinal detachment or other eye disease, the condition that caused the floaters must be treated.  Floaters due to blood in the eye often go away or lessen with time. SEEK MEDICAL CARE IF:   You suddenly see floating dots or spider webs in front of the vision of one or both eyes. This is especially true if you also see flashes of light off to the side (like flashes of lightening).  You see floaters and also notice a change or drop in your vision in either eye. Document Released: 12/03/2003 Document Revised: 02/22/2012 Document Reviewed: 03/29/2008 [facility] Patient Information 2014 [place], [place].

## 2014-04-04 NOTE — ED Notes (Signed)
Pt presents to department for evaluation of "floaters" to R eye. States history of macular degeneration and diabetes. States issues with the same in the past. States she felt like something was in her eye. States she always has blurred vision to both eyes. Pt denies pain. She is alert and oriented x4. No neurological deficits noted.

## 2014-05-16 ENCOUNTER — Ambulatory Visit (INDEPENDENT_AMBULATORY_CARE_PROVIDER_SITE_OTHER): Payer: BC Managed Care – PPO | Admitting: Internal Medicine

## 2014-05-16 ENCOUNTER — Ambulatory Visit (INDEPENDENT_AMBULATORY_CARE_PROVIDER_SITE_OTHER): Payer: BC Managed Care – PPO | Admitting: Dietician

## 2014-05-16 ENCOUNTER — Encounter: Payer: Self-pay | Admitting: Internal Medicine

## 2014-05-16 ENCOUNTER — Encounter: Payer: Self-pay | Admitting: Dietician

## 2014-05-16 VITALS — BP 140/92 | HR 79 | Temp 97.3°F | Ht 65.0 in | Wt 184.6 lb

## 2014-05-16 DIAGNOSIS — E1149 Type 2 diabetes mellitus with other diabetic neurological complication: Secondary | ICD-10-CM

## 2014-05-16 DIAGNOSIS — E1142 Type 2 diabetes mellitus with diabetic polyneuropathy: Secondary | ICD-10-CM

## 2014-05-16 DIAGNOSIS — E119 Type 2 diabetes mellitus without complications: Secondary | ICD-10-CM

## 2014-05-16 DIAGNOSIS — E039 Hypothyroidism, unspecified: Secondary | ICD-10-CM

## 2014-05-16 DIAGNOSIS — R198 Other specified symptoms and signs involving the digestive system and abdomen: Secondary | ICD-10-CM | POA: Insufficient documentation

## 2014-05-16 DIAGNOSIS — R197 Diarrhea, unspecified: Secondary | ICD-10-CM

## 2014-05-16 DIAGNOSIS — K5900 Constipation, unspecified: Secondary | ICD-10-CM

## 2014-05-16 LAB — TSH: TSH: 3.743 u[IU]/mL (ref 0.350–4.500)

## 2014-05-16 LAB — POCT GLYCOSYLATED HEMOGLOBIN (HGB A1C): HEMOGLOBIN A1C: 5.5

## 2014-05-16 LAB — GLUCOSE, CAPILLARY: Glucose-Capillary: 105 mg/dL — ABNORMAL HIGH (ref 70–99)

## 2014-05-16 NOTE — Progress Notes (Signed)
Diabetes Self-Management Education  Visit Number: Follow-up 1  05/16/2014 [name], identified by name and date of birth, is a 68 y.o. female with Type 2 diabetes  .  Other people present during visit: no      ASSESSMENT  Patient Concerns:     Weight decreased appropriately.  Lab Results: LDL Cholesterol  Date Value Ref Range Status  08/16/2013  116-direct LDL 0 - 99 mg/dL Final           Hemoglobin A1C  Date Value Ref Range Status  05/16/2014 5.5   Final     Family History  Problem Relation Age of Onset  . Lung cancer Mother   . Stroke Father   . Diabetes Father   . Colon cancer Neg Hx   . Esophageal cancer Neg Hx   . Stomach cancer Neg Hx    History  Substance Use Topics  . Smoking status: Never Smoker   . Smokeless tobacco: Never Used  . Alcohol Use: 1.8 oz/week    3 Glasses of wine per week    Support Systems:   spouse, CDE and doctor Special Needs:    none Prior DM Education:   yes here and [facility] as well Daily Foot Exams:  yes Patient Belief / Attitude about Diabetes:   it can be controlled  Assessment comments: patient is on track at [place] her self stated goals of weight loss and controlling blood sugars and doing well   Diet Recall: deferred     Individualized Plan for Diabetes Self-Management Training:  Reassessment and review  Education Topics Reviewed with Patient Today:  Topic Points Discussed  Disease State Discussed natural progression of diabetes  Nutrition Management    Physical Activity and Exercise Helped patient identify appropriate exercises in relation to his/her diabetes, diabetes complications and other health issue.  Medications Discussed possible change in preferred brand when she changes to Medicare  Monitoring    Acute Complications    Chronic Complications    Psychosocial Adjustment Identified and addressed patients feelings and concerns about diabetes  Goal Setting    Preconception Care (if applicable)       PATIENTS GOALS   Learning Objective(s):   (patient is doing well at setting her own goals)  Goal The patient agrees to:  Nutrition    Physical Activity    Medications    Monitoring    Problem Solving    Reducing Risk    Health Coping ask for help with foot care and other issues relating to her health   Patient Self-Evaluation of Goals (Subsequent Visits)  Goal The patient rates self as meeting goals (% of time)  Nutrition    Physical Activity    Medications >75%  Monitoring    Problem Solving    Reducing Risk    Health Coping       PERSONALIZED PLAN / SUPPORT  Self-Management Support:  [place] office;Family;CDE visits ______________________________________________________________________  Outcomes  Expected Outcomes:  Demonstrated interest in learning. Expect positive outcomes Self-Care Barriers:  None Education material provided: yes If problems or questions, patient to contact team via:  Phone Time in: 1330     Time out: 1430 Future DSME appointment: - Sophia Cox 05/16/2014 4:02 PM

## 2014-05-16 NOTE — Progress Notes (Signed)
Attending physician note: Presenting problems, physical findings, medications, reviewed with resident physician Dr. Shana Chute and I concur with this management plan. Murriel Hopper, MD, [place]  Hematology-Oncology/Internal Medicine

## 2014-05-16 NOTE — Progress Notes (Signed)
HPI The patient is a 68 y.o. female with a history of DM2, hypothyroidism, SVT, HTN, presenting for a follow-up visit for DM2.  The patient has a history of DM2, currently on metformin 1000 mg, Byetta 10 mcg BID.  The patient's A1C is again 5.5, which is the same as her last visit.  The patient notes no episodes of hypoglycemia, or symptoms of diaphoresis, tremulousness, or lightheadedness.  She does note occasional symptoms of nausea.  She has made significant dietary changes, including cutting out potatoes.  The patient's last eye exam was 2 months ago, in the setting of a suspected retinal tear, with Dr. Zadie Rhine (next appt July).  The patient has a history of hypothyroidism, currently on Synthroid 50 mcg daily.  The patient continues to note mild dysphagia, as noted on her last visit, as well as a sensation of something "pressing" on her throat when lying down.  She notes feeling mildly "cold" at times, constipation alternating with diarrhea, but normal energy, and no skin/hair changes.  She continues to note a feeling of something "pressing" on her throat when lying down, which has not changed significantly since our last visit.  She continues to note alternating diarrhea and constipation.  She notes that cottage cheese has sometimes precipitated the diarrhea, though she hasn't noticed a pattern with milk or other dairy products, or gluten products.  She occasionally notes mucous in the stools, though no blood.  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  She notes no abd pain, and only occasionally notes any improved feeling after having a bowel movement.  ROS: General: no fevers, chills, changes in weight, changes in appetite Skin: no rash HEENT: no blurry vision, hearing changes, sore throat Pulm: no dyspnea, coughing, wheezing CV: no chest pain, palpitations, shortness of breath Abd: see HPI GU: no dysuria,  hematuria, polyuria Ext: no arthralgias, myalgias Neuro: no weakness, numbness, or tingling  Filed Vitals:   05/16/14 1438  BP: 140/92  Pulse: 79  Temp: 97.3 F (36.3 C)    PEX General: alert, cooperative, and in no apparent distress HEENT: pupils equal round and reactive to light, vision grossly intact, oropharynx clear and non-erythematous  Neck: supple, thyroid enlarged symmetrically Lungs: clear to ascultation bilaterally, normal work of respiration, no wheezes, rales, ronchi Heart: regular rate and rhythm, no murmurs, gallops, or rubs Abdomen: soft, non-tender, non-distended, normal bowel sounds Extremities: 2+ DP/PT pulses bilaterally, no cyanosis, clubbing, or edema Neurologic: alert & oriented X3, cranial nerves II-XII intact, strength grossly intact, mildly diminished sensation over left 2-5 toes  Current Outpatient Prescriptions on File Prior to Visit  Medication Sig Dispense Refill  . BD PEN NEEDLE NANO U/F 32G X 4 MM MISC       . Blood Glucose Monitoring Suppl (BAYER CONTOUR MONITOR) W/DEVICE KIT 1 each by Does not apply route 2 (two) times daily at 10 AM and 5 PM.  1 kit  0  . exenatide (BYETTA) 10 MCG/0.04ML SOPN injection Inject 0.04 mLs (10 mcg total) into the skin 2 (two) times daily with a meal. Inject 10 mcg into the skin 2 times daily with a meal  2.4 mL  11  . glucose blood test strip Use to test blood glucose 4 times daily. Dx: 250.00  100 each  12  . levothyroxine (LEVOTHROID) 50 MCG tablet Take 1 tablet (50 mcg total) by mouth daily.  30 tablet  11  . metFORMIN (GLUCOPHAGE) 1000  MG tablet Take 1 tablet (1,000 mg total) by mouth 2 (two) times daily with a meal.  60 tablet  11  . omeprazole (PRILOSEC) 40 MG capsule Take 1 capsule (40 mg total) by mouth daily.  30 capsule  11   No current facility-administered medications on file prior to visit.    Assessment/Plan

## 2014-05-16 NOTE — Assessment & Plan Note (Signed)
Lab Results  Component Value Date   HGBA1C 5.5 05/16/2014   HGBA1C 5.5 12/04/2013   HGBA1C 6.2 08/16/2013     Assessment: Diabetes control: good control (HgbA1C at goal) Progress toward A1C goal:  at goal Comments: A1C remains excellent.  The patient currently notes no symptoms of hypoglycemia, despite an A1C of 5.5.  In the future, we could decrease her Byetta to 5 mcg BID (from her current 10 BID), but the patient is resistant to doing this at this point, due to her desire to continue to lose 10 more lbs, and Byetta's weight loss effects.  For now, we will continue her medications, and consider decreasing Byetta at her next visit.  Plan: Medications:  Continue Metoprolol 1000 mg BID, Byetta 10 mcg BID Home glucose monitoring: Frequency:   Timing:   Instruction/counseling given: discussed diet Educational resources provided:   Self management tools provided:   Other plans: Recheck at next visit

## 2014-05-16 NOTE — Assessment & Plan Note (Signed)
The patient notes symptoms of alternating constipation and diarrhea, with no obvious food trigger.  Differential includes pancreatic insufficiency (history of DM2, though no history of prior pancreatitis) vs medication side effect (metformin, Byetta) vs IBS (though likely doesn't meet the [place] criteria due to the absence of significant abdominal discomfort).  Colonoscopy unremarkable. -we will soon be down-titrating her DM medications; will assess for change in symptoms, and continue work-up thereafter

## 2014-05-16 NOTE — Assessment & Plan Note (Signed)
The patient notes mild symptoms of cold intolerance and constipation, though also diarrhea.  The patient also notes concerning symptoms of feeling something "pressing" on her throat, though she notes no difficulty breathing.  Thyroid is enlarged on exam, and she reports a thyroid US through her job at [employer] [place] previously. I'm not sure if we can expect the thyroid to shrink with continued synthroid supplementation, or if the patient will need radioablation vs surgery. -refer to Endocrinology to discuss options for thyroid management -check TSH today

## 2014-05-16 NOTE — Patient Instructions (Addendum)
General Instructions: For your Diabetes, your A1C is 5.5 -soon, we can start decreasing your diabetes medications, starting with Byetta  For your thyroid, we are re-checking your TSH level today, and will contact you if we need to change your Synthroid dose. -we are also referring you to Endocrinology, and will contact you with an appointment  Please return for a follow-up visit in 6 months.  Please bring your medicines with you each time you come to clinic.  Medicines may include prescription medications, over-the-counter medications, herbal remedies, eye drops, vitamins, or other pills.   Progress Toward Treatment Goals:  Treatment Goal 05/16/2014  Hemoglobin A1C at goal  Blood pressure at goal  Prevent falls unchanged    Self Care Goals & Plans:  Self Care Goal 05/16/2014  Manage my medications take my medicines as prescribed; bring my medications to every visit; refill my medications on time  Monitor my health keep track of my blood glucose; bring my glucose meter and log to each visit  Eat healthy foods eat more vegetables; eat foods that are low in salt; eat baked foods instead of fried foods  Be physically active take a walk every day  Prevent falls -  Other -    Home Blood Glucose Monitoring 12/04/2013  Check my blood sugar once a day  When to check my blood sugar -     Care Management & Community Referrals:  Referral 05/16/2014  Referrals made for care management support none needed

## 2014-05-17 LAB — MICROALBUMIN / CREATININE URINE RATIO
CREATININE, URINE: 41.6 mg/dL
MICROALB UR: 0.76 mg/dL (ref 0.00–1.89)
MICROALB/CREAT RATIO: 18.3 mg/g (ref 0.0–30.0)

## 2014-05-23 ENCOUNTER — Encounter: Payer: BC Managed Care – PPO | Admitting: Dietician

## 2014-05-23 ENCOUNTER — Encounter: Payer: BC Managed Care – PPO | Admitting: Internal Medicine

## 2014-06-05 LAB — HM DIABETES EYE EXAM

## 2014-07-27 ENCOUNTER — Other Ambulatory Visit: Payer: Self-pay | Admitting: *Deleted

## 2014-07-29 MED ORDER — METFORMIN HCL 1000 MG PO TABS: 1000.0000 mg | ORAL_TABLET | Freq: Two times a day (BID) | ORAL | Status: DC

## 2014-08-12 ENCOUNTER — Other Ambulatory Visit: Payer: Self-pay | Admitting: Internal Medicine

## 2014-08-12 DIAGNOSIS — E1142 Type 2 diabetes mellitus with diabetic polyneuropathy: Secondary | ICD-10-CM

## 2014-08-28 ENCOUNTER — Other Ambulatory Visit: Payer: Self-pay | Admitting: *Deleted

## 2014-08-29 MED ORDER — LEVOTHYROXINE SODIUM 50 MCG PO TABS: 50.0000 ug | ORAL_TABLET | Freq: Every day | ORAL | Status: DC

## 2014-09-20 ENCOUNTER — Other Ambulatory Visit: Payer: Self-pay | Admitting: Internal Medicine

## 2014-09-20 DIAGNOSIS — E039 Hypothyroidism, unspecified: Secondary | ICD-10-CM | POA: Diagnosis not present

## 2014-09-20 DIAGNOSIS — E049 Nontoxic goiter, unspecified: Secondary | ICD-10-CM | POA: Diagnosis not present

## 2014-09-20 DIAGNOSIS — R1310 Dysphagia, unspecified: Secondary | ICD-10-CM | POA: Diagnosis not present

## 2014-09-21 ENCOUNTER — Ambulatory Visit
Admission: RE | Admit: 2014-09-21 | Discharge: 2014-09-21 | Disposition: A | Discharge status: Home / Self Care | Payer: Medicare Other | Source: Ambulatory Visit | Attending: Internal Medicine | Admitting: Internal Medicine

## 2014-09-21 DIAGNOSIS — E049 Nontoxic goiter, unspecified: Secondary | ICD-10-CM

## 2014-09-21 DIAGNOSIS — E042 Nontoxic multinodular goiter: Secondary | ICD-10-CM | POA: Diagnosis not present

## 2014-09-22 ENCOUNTER — Other Ambulatory Visit: Payer: Self-pay | Admitting: Internal Medicine

## 2014-10-15 ENCOUNTER — Other Ambulatory Visit (INDEPENDENT_AMBULATORY_CARE_PROVIDER_SITE_OTHER): Payer: Self-pay

## 2014-10-15 DIAGNOSIS — E049 Nontoxic goiter, unspecified: Secondary | ICD-10-CM

## 2014-10-15 DIAGNOSIS — R1314 Dysphagia, pharyngoesophageal phase: Secondary | ICD-10-CM

## 2014-10-15 DIAGNOSIS — E042 Nontoxic multinodular goiter: Secondary | ICD-10-CM | POA: Diagnosis not present

## 2014-10-17 ENCOUNTER — Other Ambulatory Visit (INDEPENDENT_AMBULATORY_CARE_PROVIDER_SITE_OTHER): Payer: Self-pay

## 2014-10-17 DIAGNOSIS — R1314 Dysphagia, pharyngoesophageal phase: Secondary | ICD-10-CM

## 2014-10-19 ENCOUNTER — Other Ambulatory Visit (HOSPITAL_COMMUNITY): Payer: Self-pay | Admitting: Surgery

## 2014-10-19 DIAGNOSIS — R1310 Dysphagia, unspecified: Secondary | ICD-10-CM

## 2014-10-23 ENCOUNTER — Ambulatory Visit (HOSPITAL_COMMUNITY)
Admission: RE | Admit: 2014-10-23 | Discharge: 2014-10-23 | Disposition: A | Discharge status: Home / Self Care | Payer: Medicare Other | Source: Ambulatory Visit | Attending: Surgery | Admitting: Surgery

## 2014-10-23 DIAGNOSIS — R1310 Dysphagia, unspecified: Secondary | ICD-10-CM | POA: Diagnosis not present

## 2014-10-23 DIAGNOSIS — K222 Esophageal obstruction: Secondary | ICD-10-CM | POA: Diagnosis not present

## 2014-10-23 DIAGNOSIS — R1314 Dysphagia, pharyngoesophageal phase: Secondary | ICD-10-CM

## 2014-10-23 NOTE — Procedures (Signed)
Objective Swallowing Evaluation: Modified Barium Swallowing Study  Patient Details  Name: Chandelle Harkey MRN: [id number] Date of Birth: 03-07-1946  Today's Date: 10/23/2014 Time: 1128-1157 SLP Time Calculation (min) (ACUTE ONLY): 29 min  Past Medical History:  Past Medical History  Diagnosis Date  . Hypertension   . Supraventricular tachycardia   . Pneumonia [phone]    "quit a few times" (06/21/2013)  . Hypothyroidism   . Type II diabetes mellitus   . XJDBZMCE(022.3)     "weekly" (06/21/2013)  . Arthritis     "left knee" (06/21/2013)   Past Surgical History:  Past Surgical History  Procedure Laterality Date  . Supraventricular tachycardia ablation  06/21/2013  . Tonsillectomy  ~ 1952  . Cholecystectomy  ~ 1989  . Tubal ligation  1971   HPI:  Pt is a 68 year old female with a past medical history of pna, diabetes, goiter, and acid reflux. She presents with difficulty swallowing characterized by a "choking sensation" and "inability to swallow at all". She reports that she believes it's worse with stress and sometimes involves a feeling that she can't breathe. This sensation is often accompanied by changes in vocal quality decribed as hoarsness.     Assessment / Plan / Recommendation Clinical Impression  Dysphagia Diagnosis: Within Functional Limits  Pt demonstrates oral and oropharyngeal swallow function within normal limits. Barium pill appeared to hesitate at the mid esophagus and again at the gastroesophageal junction. See radiologist report for further details. Pt indicated concerns regarding a feeling of being unable to phonate or intimate swallow concerning for possible vocal fold dysfunction. Consider ENT evaluation of vocal fold function.     Treatment Recommendation  No treatment recommended at this time    Diet Recommendation Regular;Thin liquid   Liquid Administration via: Cup;Straw Medication Administration: Whole meds with liquid Supervision: Patient able  to self feed Postural Changes and/or Swallow Maneuvers: Seated upright 90 degrees;Upright 30-60 min after meal    Other  Recommendations Recommended Consults: Consider ENT evaluation Oral Care Recommendations: Oral care BID   Follow Up Recommendations       Frequency and Duration        Pertinent Vitals/Pain none    SLP Swallow Goals     General Date of Onset: 10/23/14 HPI: Pt is a 68 year old female with a past medical history of pna, diabetes, goiter, and acid reflux. She presents with difficulty swallowing characterized by a "choking sensation" and "inability to swallow at all". She reports that she believes it's worse with stress and sometimes involves a feeling that she can't breathe. This sensation is often accompanied by changes in vocal quality decribed as hoarsness. Type of Study: Modified Barium Swallowing Study Reason for Referral: Objectively evaluate swallowing function Diet Prior to this Study: Regular;Thin liquids Temperature Spikes Noted: No Respiratory Status: Room air History of Recent Intubation: No Behavior/Cognition: Alert;Cooperative;Pleasant mood Oral Cavity - Dentition: Adequate natural dentition Oral Motor / Sensory Function: Within functional limits Self-Feeding Abilities: Able to feed self Patient Positioning: Upright in chair Baseline Vocal Quality: Clear Anatomy: Within functional limits Pharyngeal Secretions: Not observed secondary MBS    Reason for Referral Objectively evaluate swallowing function   Oral Phase Oral Preparation/Oral Phase Oral Phase: WFL   Pharyngeal Phase Pharyngeal Phase Pharyngeal Phase: Within functional limits  Cervical Esophageal Phase    GO    Cervical Esophageal Phase Cervical Esophageal Phase: Delfina Redwood 10/23/2014, 1:11 PM

## 2014-10-25 ENCOUNTER — Ambulatory Visit: Payer: Medicare Other

## 2014-11-01 DIAGNOSIS — E039 Hypothyroidism, unspecified: Secondary | ICD-10-CM | POA: Diagnosis not present

## 2014-11-01 DIAGNOSIS — E049 Nontoxic goiter, unspecified: Secondary | ICD-10-CM | POA: Diagnosis not present

## 2014-11-14 ENCOUNTER — Ambulatory Visit (INDEPENDENT_AMBULATORY_CARE_PROVIDER_SITE_OTHER): Payer: Medicare Other | Admitting: Internal Medicine

## 2014-11-14 ENCOUNTER — Encounter: Payer: Self-pay | Admitting: Internal Medicine

## 2014-11-14 VITALS — BP 157/91 | HR 75 | Temp 98.2°F | Wt 186.8 lb

## 2014-11-14 DIAGNOSIS — E038 Other specified hypothyroidism: Secondary | ICD-10-CM

## 2014-11-14 DIAGNOSIS — E1142 Type 2 diabetes mellitus with diabetic polyneuropathy: Secondary | ICD-10-CM

## 2014-11-14 DIAGNOSIS — I10 Essential (primary) hypertension: Secondary | ICD-10-CM | POA: Diagnosis not present

## 2014-11-14 DIAGNOSIS — E039 Hypothyroidism, unspecified: Secondary | ICD-10-CM | POA: Diagnosis not present

## 2014-11-14 DIAGNOSIS — Z0000 Encounter for general adult medical examination without abnormal findings: Secondary | ICD-10-CM

## 2014-11-14 DIAGNOSIS — E1140 Type 2 diabetes mellitus with diabetic neuropathy, unspecified: Secondary | ICD-10-CM | POA: Diagnosis present

## 2014-11-14 LAB — POCT GLYCOSYLATED HEMOGLOBIN (HGB A1C): HEMOGLOBIN A1C: 5.5

## 2014-11-14 LAB — GLUCOSE, CAPILLARY: Glucose-Capillary: 86 mg/dL (ref 70–99)

## 2014-11-14 MED ORDER — METFORMIN HCL 1000 MG PO TABS: 1000.0000 mg | ORAL_TABLET | Freq: Every day | ORAL | Status: DC

## 2014-11-14 NOTE — Assessment & Plan Note (Signed)
Lab Results  Component Value Date   HGBA1C 5.5 11/14/2014   HGBA1C 5.5 05/16/2014   HGBA1C 5.5 12/04/2013     Assessment: Diabetes control: good control (HgbA1C at goal) Progress toward A1C goal:  unchanged Comments: Sophia Cox was only taking her metformin 1000 mg bid once a day and her exenatide 10 mcg bid either once a day or never. This has been consistent since she saw Dr Owens Shark in 05/2014. She also says she has been eating about 20 sugar candies a night. She is surprised that with her non-compliance and diet her A1c is still 5.5. We discussed that her goal would be <7 but also >6.5 as there is decreased survival if HgbA1c is too low (J-curve). She denies any hypoglycemic symptoms but does not regularly check her sugar at home. She saw her ophthalmologist Dr Zadie Rhine in 03/2014 as she has macular degeneration.  Plan: Medications:  We will discontinue exenatide completely and reduce metformin to 1000 mg daily and reassess in 3 months. If A1c is still <6.5, we will consider discontinuing metformin altogether. Home glucose monitoring: Frequency:   Timing:   Instruction/counseling given: reminded to bring blood glucose meter & log to each visit and reminded to bring medications to each visit Educational resources provided:   Self management tools provided:   Other plans: none

## 2014-11-14 NOTE — Assessment & Plan Note (Signed)
Sophia Cox deferred mammogram and flu shot at this time as she says she does not want any medical intervention unless absolutely necessarily. We discussed benefit of breast cancer screening and she still defers. -continue conversations in future

## 2014-11-14 NOTE — Assessment & Plan Note (Signed)
See HPI. Sophia Cox has multinodular goiter as discussed above. This has been evaluated recently by endocrinology and surgery. Per patient, the surgeon would consider operating if she preferred but she says she is a minimalist with medicine and would not unless she absolutely had to -continue synthroid 75 mcg daily

## 2014-11-14 NOTE — Assessment & Plan Note (Signed)
BP Readings from Last 3 Encounters:  11/14/14 157/91  05/16/14 140/92  04/04/14 149/73    Lab Results  Component Value Date   NA 136 06/19/2013   K 4.0 06/19/2013   CREATININE 0.7 06/19/2013    Assessment: Blood pressure control: mildly elevated Progress toward BP goal:  deteriorated Comments: Her BP is elevated today compared to previous measures. She says she would not be interested in any antihypertensive medications even if it is elevated in the future although she has diabetes so could benefit from lisinopril.  Plan: Medications:  recheck at 3 months and repeat conversation about considering lisinopril if appropriate at that time Educational resources provided:   Self management tools provided:   Other plans: none

## 2014-11-14 NOTE — Progress Notes (Signed)
   Subjective:    Patient ID: Sophia Cox, female    DOB: [birth year], 68 y.o.   MRN: [id number]  HPI  Ms Ritter is a 68 year old woman with DM2, HTN, hypothyroidism, and h/o SVT here for routine follow-up.  She was recently evaluated by [facility] endocrinology on 10/8 for an enlarged thyroid and they increased her synthroid to 75 mcg (from 50 mcg daily). She had an ultrasound that showed R lobe 7 cm and L lobe 5.5 cm with bilateral subcentimeter nodules and referred to her surgery. [facility] wanted a swallow evaluation before they would consider a surgical intervention for dysphagia. Her swallow study was suggestive of vocal cord dysfunction per patient (records not available). She would not like an ENT referral. She has not had any dysphagia for the past month and a half. She has no other complaints.  Review of Systems  Constitutional: Negative for fever, chills and diaphoresis.  HENT: Negative for sore throat, trouble swallowing and voice change.   Respiratory: Negative for cough and shortness of breath.   Cardiovascular: Negative for chest pain and palpitations.  Gastrointestinal: Negative for nausea, vomiting, abdominal pain, diarrhea and constipation.  Endocrine: Negative for polydipsia and polyuria.  Neurological: Negative for dizziness, weakness, light-headedness, numbness and headaches.       Objective:   Physical Exam  Constitutional: She is oriented to person, place, and time. She appears well-developed and well-nourished. No distress.  HENT:  Head: Normocephalic and atraumatic.  Mouth/Throat: Oropharynx is clear and moist.  Eyes: EOM are normal. Pupils are equal, round, and reactive to light.  Neck: Thyromegaly present.  Cardiovascular: Normal rate, regular rhythm, normal heart sounds and intact distal pulses.  Exam reveals no gallop and no friction rub.   No murmur heard. Pulmonary/Chest: Effort normal and breath sounds normal. No respiratory  distress. She has no wheezes.  Abdominal: Soft. Bowel sounds are normal. She exhibits no distension. There is no tenderness.  Neurological: She is alert and oriented to person, place, and time.  Skin: She is not diaphoretic.  Vitals reviewed.         Assessment & Plan:

## 2014-11-14 NOTE — Patient Instructions (Signed)
It was a pleasure to see you today. We have stopped your byetta and will only do metformin 1000 mg once a day. Please return to clinic or seek medical attention if you have any new or worsening trouble breathing, swallowing, or other worrisome medical condition. We look forward to seeing you again in 3 months.  Lottie Mussel, MD  General Instructions:   Please try to bring all your medicines next time. This will help [place] keep you safe from mistakes.   Progress Toward Treatment Goals:  Treatment Goal 11/14/2014  Hemoglobin A1C unchanged  Blood pressure deteriorated  Prevent falls -    Self Care Goals & Plans:  Self Care Goal 05/16/2014  Manage my medications take my medicines as prescribed; bring my medications to every visit; refill my medications on time  Monitor my health keep track of my blood glucose; bring my glucose meter and log to each visit  Eat healthy foods eat more vegetables; eat foods that are low in salt; eat baked foods instead of fried foods  Be physically active take a walk every day  Prevent falls -  Other -    Home Blood Glucose Monitoring 12/04/2013  Check my blood sugar once a day  When to check my blood sugar -     Care Management & Community Referrals:  Referral 05/16/2014  Referrals made for care management support none needed

## 2014-11-18 NOTE — Progress Notes (Signed)
Patient ID: Sophia Cox, female   DOB: March 09, 1946, 68 y.o.   MRN: [id number]  I saw and evaluated the patient.  I personally confirmed the key portions of Dr. Bebe Shaggy history and exam and reviewed pertinent patient test results.  The assessment, diagnosis, and plan were formulated together and I agree with the documentation in the resident's note.

## 2014-11-22 ENCOUNTER — Encounter (HOSPITAL_COMMUNITY): Payer: Self-pay | Admitting: Internal Medicine

## 2014-12-20 DIAGNOSIS — E049 Nontoxic goiter, unspecified: Secondary | ICD-10-CM | POA: Diagnosis not present

## 2014-12-20 DIAGNOSIS — E039 Hypothyroidism, unspecified: Secondary | ICD-10-CM | POA: Diagnosis not present

## 2015-04-10 ENCOUNTER — Encounter: Payer: Self-pay | Admitting: *Deleted

## 2015-04-25 DIAGNOSIS — L820 Inflamed seborrheic keratosis: Secondary | ICD-10-CM | POA: Diagnosis not present

## 2015-04-30 ENCOUNTER — Encounter: Payer: Self-pay | Admitting: *Deleted

## 2015-06-04 DIAGNOSIS — H43813 Vitreous degeneration, bilateral: Secondary | ICD-10-CM | POA: Diagnosis not present

## 2015-06-04 DIAGNOSIS — H3531 Nonexudative age-related macular degeneration: Secondary | ICD-10-CM | POA: Diagnosis not present

## 2015-07-18 DIAGNOSIS — H524 Presbyopia: Secondary | ICD-10-CM | POA: Diagnosis not present

## 2015-07-18 DIAGNOSIS — H25813 Combined forms of age-related cataract, bilateral: Secondary | ICD-10-CM | POA: Diagnosis not present

## 2015-07-18 DIAGNOSIS — H31013 Macula scars of posterior pole (postinflammatory) (post-traumatic), bilateral: Secondary | ICD-10-CM | POA: Diagnosis not present

## 2015-08-05 DIAGNOSIS — H2512 Age-related nuclear cataract, left eye: Secondary | ICD-10-CM | POA: Diagnosis not present

## 2015-09-02 DIAGNOSIS — H25812 Combined forms of age-related cataract, left eye: Secondary | ICD-10-CM | POA: Diagnosis not present

## 2015-09-02 DIAGNOSIS — H2512 Age-related nuclear cataract, left eye: Secondary | ICD-10-CM | POA: Diagnosis not present

## 2015-09-10 DIAGNOSIS — H2511 Age-related nuclear cataract, right eye: Secondary | ICD-10-CM | POA: Diagnosis not present

## 2015-09-16 DIAGNOSIS — H25811 Combined forms of age-related cataract, right eye: Secondary | ICD-10-CM | POA: Diagnosis not present

## 2015-09-16 DIAGNOSIS — H2511 Age-related nuclear cataract, right eye: Secondary | ICD-10-CM | POA: Diagnosis not present

## 2015-10-17 DIAGNOSIS — H26492 Other secondary cataract, left eye: Secondary | ICD-10-CM | POA: Diagnosis not present

## 2015-10-17 DIAGNOSIS — Z961 Presence of intraocular lens: Secondary | ICD-10-CM | POA: Diagnosis not present

## 2016-01-31 ENCOUNTER — Ambulatory Visit (INDEPENDENT_AMBULATORY_CARE_PROVIDER_SITE_OTHER): Payer: Medicare Other | Admitting: Internal Medicine

## 2016-01-31 ENCOUNTER — Encounter: Payer: Self-pay | Admitting: Internal Medicine

## 2016-01-31 VITALS — BP 153/89 | HR 103 | Wt 204.1 lb

## 2016-01-31 DIAGNOSIS — Z0000 Encounter for general adult medical examination without abnormal findings: Secondary | ICD-10-CM | POA: Diagnosis not present

## 2016-01-31 DIAGNOSIS — Z7984 Long term (current) use of oral hypoglycemic drugs: Secondary | ICD-10-CM | POA: Diagnosis not present

## 2016-01-31 DIAGNOSIS — E038 Other specified hypothyroidism: Secondary | ICD-10-CM | POA: Diagnosis not present

## 2016-01-31 DIAGNOSIS — I10 Essential (primary) hypertension: Secondary | ICD-10-CM

## 2016-01-31 DIAGNOSIS — Z6833 Body mass index (BMI) 33.0-33.9, adult: Secondary | ICD-10-CM

## 2016-01-31 DIAGNOSIS — E1165 Type 2 diabetes mellitus with hyperglycemia: Secondary | ICD-10-CM

## 2016-01-31 DIAGNOSIS — K219 Gastro-esophageal reflux disease without esophagitis: Secondary | ICD-10-CM | POA: Diagnosis not present

## 2016-01-31 DIAGNOSIS — E039 Hypothyroidism, unspecified: Secondary | ICD-10-CM | POA: Diagnosis not present

## 2016-01-31 DIAGNOSIS — E669 Obesity, unspecified: Secondary | ICD-10-CM | POA: Diagnosis not present

## 2016-01-31 DIAGNOSIS — E1142 Type 2 diabetes mellitus with diabetic polyneuropathy: Secondary | ICD-10-CM | POA: Diagnosis not present

## 2016-01-31 DIAGNOSIS — E785 Hyperlipidemia, unspecified: Secondary | ICD-10-CM | POA: Diagnosis not present

## 2016-01-31 LAB — POCT GLYCOSYLATED HEMOGLOBIN (HGB A1C): Hemoglobin A1C: 9.4

## 2016-01-31 LAB — GLUCOSE, CAPILLARY: Glucose-Capillary: 292 mg/dL — ABNORMAL HIGH (ref 65–99)

## 2016-01-31 MED ORDER — EXENATIDE 10 MCG/0.04ML ~~LOC~~ SOPN: 10.0000 ug | PEN_INJECTOR | Freq: Two times a day (BID) | SUBCUTANEOUS | Status: DC

## 2016-01-31 MED ORDER — METFORMIN HCL 1000 MG PO TABS: 1000.0000 mg | ORAL_TABLET | Freq: Two times a day (BID) | ORAL | Status: DC

## 2016-01-31 MED ORDER — INSULIN PEN NEEDLE 32G X 4 MM MISC: Status: DC

## 2016-01-31 MED ORDER — OMEPRAZOLE 20 MG PO CPDR: 20.0000 mg | DELAYED_RELEASE_CAPSULE | Freq: Every day | ORAL | Status: DC

## 2016-01-31 MED ORDER — GLUCOSE BLOOD VI STRP: ORAL_STRIP | Status: DC

## 2016-01-31 MED ORDER — EXENATIDE 5 MCG/0.02ML ~~LOC~~ SOPN: 5.0000 ug | PEN_INJECTOR | Freq: Two times a day (BID) | SUBCUTANEOUS | Status: DC

## 2016-01-31 NOTE — Patient Instructions (Signed)
-  Your A1c is 9.4 which has increased from 5.5  -Start taking metformin 1000 mg once a day and then increase after 1 week to  twice a day if you are able to tolerate it -Start byetta injection twice a day - after 1 month you can increase it to 10 twice a day -Start taking prilosec 20 mg daily for acid reflux -Will check your bloodwork and call you with the results -Very nice meeting you, please come back in May   General Instructions:   Thank you for bringing your medicines today. This helps [place] keep you safe from mistakes.   Progress Toward Treatment Goals:  Treatment Goal 11/14/2014  Hemoglobin A1C unchanged  Blood pressure deteriorated  Prevent falls -    Self Care Goals & Plans:  Self Care Goal 01/31/2016  Manage my medications take my medicines as prescribed; refill my medications on time  Monitor my health check my feet daily; keep track of my blood glucose; bring my glucose meter and log to each visit  Eat healthy foods eat foods that are low in salt; eat baked foods instead of fried foods; drink diet soda or water instead of juice or soda  Be physically active -  Prevent falls -    Home Blood Glucose Monitoring 12/04/2013  Check my blood sugar once a day  When to check my blood sugar -     Care Management & Community Referrals:  Referral 05/16/2014  Referrals made for care management support none needed

## 2016-01-31 NOTE — Progress Notes (Signed)
Patient ID: Sophia Cox, female   DOB: Mar 09, 1946, 70 y.o.   MRN: [id number]   Subjective:   Patient ID: Sophia Cox female   DOB: October 10, 1946 70 y.o.   MRN: [id number]  HPI: Sophia Cox is a 70 y.o. pleasant woman with past medical history of hypertension, hyperlipidemia, Type 2 DM, hypothyroidism on replacement, SVT s/p ablation, and GERD who presents with chief complaint of hyperglycemia.   Her last A1c was 5.5 on 11/14/14 and has since been diet controlled. She was previously on metformin and byetta. She has been checking her blood glucose multiple times per day for the past month with range of 254-521. She denies symptomatic hypoglycemia. She has been having blurry vision, polyuria, nausea, abdominal pain, and chronic peripheral neuropathy but denies polydipsia, polyphagia, or foot injury/ulcer. She tries to follow a healthy diet and exercise. She has gained 18 lb since last visit 14 months ago.       She has hypothyroidism with last TSH that was normal on 05/16/14. She is compliant with taking synthroid 75 mcg daily. She has an upcoming appointment with endocrinologist Dr. Buddy Duty.    She reports acid reflux symptoms with heart burn, regurgitation, and cough. She is no longer taking prilosec as previously prescribed.  She has history of hypertension and hyperlipidemia but declines starting medical therapy.   She also declines immunizations or screening mammogram.      Past Medical History  Diagnosis Date  . Hypertension   . Supraventricular tachycardia ([place])   . Pneumonia [phone]    "quit a few times" (06/21/2013)  . Hypothyroidism   . Type II diabetes mellitus ([place])   . ZJIRCVEL(381.0)     "weekly" (06/21/2013)  . Arthritis     "left knee" (06/21/2013)   Current Outpatient Prescriptions  Medication Sig Dispense Refill  . BD PEN NEEDLE NANO U/F 32G X 4 MM MISC     . Blood Glucose Monitoring Suppl (BAYER CONTOUR MONITOR) W/DEVICE KIT 1 each by Does  not apply route 2 (two) times daily at 10 AM and 5 PM. 1 kit 0  . glucose blood test strip Use to test blood glucose 4 times daily. Dx: 250.00 100 each 12  . Insulin Pen Needle (BD PEN NEEDLE NANO U/F) 32G X 4 MM MISC Use to inject insulin 2 times daily. diag code 250.00.insulin dependent 100 each 6  . levothyroxine (SYNTHROID, LEVOTHROID) 50 MCG tablet Take 1 tablet (50 mcg total) by mouth daily. (Patient taking differently: Take 75 mcg by mouth daily. ) 30 tablet 11  . metFORMIN (GLUCOPHAGE) 1000 MG tablet Take 1 tablet (1,000 mg total) by mouth daily with breakfast. 60 tablet 11  . omeprazole (PRILOSEC) 40 MG capsule TAKE 1 CAPSULE (40 MG TOTAL) BY MOUTH DAILY. (Patient not taking: Reported on 11/14/2014) 30 capsule 6   No current facility-administered medications for this visit.   Family History  Problem Relation Age of Onset  . Lung cancer Mother   . Stroke Father   . Diabetes Father   . Colon cancer Neg Hx   . Esophageal cancer Neg Hx   . Stomach cancer Neg Hx    Social History   Social History  . Marital Status: Married    Spouse Name: N/A  . Number of Children: 3  . Years of Education: N/A   Occupational History  .  [employer]   Social History Main Topics  . Smoking status: Never Smoker   . Smokeless tobacco: Never Used  .  Alcohol Use: 1.8 oz/week    3 Glasses of wine per week  . Drug Use: No  . Sexual Activity: Yes   Other Topics Concern  . None   Social History Narrative   Review of Systems: Review of Systems  Constitutional: Negative for fever and chills.  HENT: Negative for congestion.   Eyes: Positive for blurred vision.  Respiratory: Positive for cough (dry). Negative for shortness of breath and wheezing.   Cardiovascular: Negative for chest pain and leg swelling.  Gastrointestinal: Positive for heartburn, nausea, abdominal pain, diarrhea (history of IBS) and constipation (history of IBS).  Genitourinary: Negative for dysuria, urgency and  frequency.       Polyuria   Musculoskeletal: Positive for joint pain (chonic left knee pain).  Neurological: Positive for dizziness and sensory change (chronic peripheral neuropathy ). Negative for headaches.       Resting tremor  Endo/Heme/Allergies: Negative for polydipsia.  Psychiatric/Behavioral: Negative for substance abuse.    Objective:  Physical Exam: Filed Vitals:   01/31/16 1451  BP: 153/89  Pulse: 103  Weight: 204 lb 1.6 oz (92.579 kg)  SpO2: 96%    Physical Exam  Constitutional: She is oriented to person, place, and time. She appears well-developed and well-nourished. No distress.  HENT:  Head: Normocephalic and atraumatic.  Right Ear: External ear normal.  Left Ear: External ear normal.  Nose: Nose normal.  Mouth/Throat: Oropharynx is clear and moist. No oropharyngeal exudate.  Eyes: Conjunctivae and EOM are normal. Pupils are equal, round, and reactive to light. Right eye exhibits no discharge. Left eye exhibits no discharge. No scleral icterus.  Neck: Normal range of motion. Neck supple. Thyromegaly present.  Cardiovascular: Normal rate, regular rhythm and normal heart sounds.   Pulmonary/Chest: Effort normal and breath sounds normal. No respiratory distress. She has no wheezes. She has no rales.  Abdominal: Soft. Bowel sounds are normal. She exhibits no distension and no mass. There is no tenderness. There is no rebound and no guarding.  Musculoskeletal: Normal range of motion. She exhibits no edema or tenderness.  Neurological: She is alert and oriented to person, place, and time.  Normal sensation to light touch of extremities   Skin: Skin is warm and dry. No rash noted. She is not diaphoretic. No erythema. No pallor.  Psychiatric: She has a normal mood and affect. Her behavior is normal. Judgment and thought content normal.    Assessment & Plan:   Please see problem list for problem-based assessment and plan

## 2016-02-01 DIAGNOSIS — E1169 Type 2 diabetes mellitus with other specified complication: Secondary | ICD-10-CM | POA: Insufficient documentation

## 2016-02-01 DIAGNOSIS — K222 Esophageal obstruction: Secondary | ICD-10-CM | POA: Insufficient documentation

## 2016-02-01 DIAGNOSIS — E663 Overweight: Secondary | ICD-10-CM | POA: Insufficient documentation

## 2016-02-01 DIAGNOSIS — K219 Gastro-esophageal reflux disease without esophagitis: Secondary | ICD-10-CM | POA: Insufficient documentation

## 2016-02-01 DIAGNOSIS — E785 Hyperlipidemia, unspecified: Secondary | ICD-10-CM

## 2016-02-01 LAB — CMP14 + ANION GAP
ALK PHOS: 122 IU/L — AB (ref 39–117)
ALT: 25 IU/L (ref 0–32)
ANION GAP: 17 mmol/L (ref 10.0–18.0)
AST: 19 IU/L (ref 0–40)
Albumin/Globulin Ratio: 1.6 (ref 1.1–2.5)
Albumin: 4.2 g/dL (ref 3.6–4.8)
BILIRUBIN TOTAL: 0.3 mg/dL (ref 0.0–1.2)
BUN/Creatinine Ratio: 19 (ref 11–26)
BUN: 13 mg/dL (ref 8–27)
CHLORIDE: 103 mmol/L (ref 96–106)
CO2: 23 mmol/L (ref 18–29)
Calcium: 9.8 mg/dL (ref 8.7–10.3)
Creatinine, Ser: 0.68 mg/dL (ref 0.57–1.00)
GFR calc Af Amer: 103 mL/min/{1.73_m2} (ref >59)
GFR calc non Af Amer: 90 mL/min/{1.73_m2} (ref >59)
GLUCOSE: 269 mg/dL — AB (ref 65–99)
Globulin, Total: 2.7 g/dL (ref 1.5–4.5)
Potassium: 4 mmol/L (ref 3.5–5.2)
Sodium: 143 mmol/L (ref 134–144)
Total Protein: 6.9 g/dL (ref 6.0–8.5)

## 2016-02-01 LAB — LIPID PANEL
CHOLESTEROL TOTAL: 197 mg/dL (ref 100–199)
Chol/HDL Ratio: 6.4 ratio units — ABNORMAL HIGH (ref 0.0–4.4)
HDL: 31 mg/dL — AB (ref >39)
Triglycerides: 497 mg/dL — ABNORMAL HIGH (ref 0–149)

## 2016-02-01 LAB — TSH: TSH: 3.82 u[IU]/mL (ref 0.450–4.500)

## 2016-02-01 LAB — MICROALBUMIN / CREATININE URINE RATIO
Creatinine, Urine: 101.9 mg/dL
MICROALB/CREAT RATIO: 68.5 mg/g{creat} — AB (ref 0.0–30.0)
Microalbumin, Urine: 69.8 ug/mL

## 2016-02-01 LAB — HEPATITIS C ANTIBODY: Hep C Virus Ab: <0.1 s/co ratio (ref 0.0–0.9)

## 2016-02-01 MED ORDER — LEVOTHYROXINE SODIUM 75 MCG PO TABS: 75.0000 ug | ORAL_TABLET | Freq: Every day | ORAL | Status: DC

## 2016-02-01 NOTE — Assessment & Plan Note (Addendum)
Assessment: Pt with hypothyroidism with last TSH level normal on 05/16/14 compliant with replacement therapy.     Plan:  -Obtain TSH level ---> normal  -Continue synthroid 75 mcg daily  -Pt to follow-up with endocrinologist Dr. Buddy Duty, had evidence of bilateral subcentimer thyroid nodules on thyroid US on 09/21/14, may need repeat US

## 2016-02-01 NOTE — Assessment & Plan Note (Signed)
Assessment: Pt with history of GERD no longer on PPI therapy who presents with acid reflux symptoms with no alarm symptoms.   Plan: -Prescribe prilosec 20 mg daily to take 1 hr before breakfast -Encourage lifestyle modification   -Monitor for alarm symptoms warranting EGD

## 2016-02-01 NOTE — Assessment & Plan Note (Deleted)
S/p ablation on 06/21/13

## 2016-02-01 NOTE — Assessment & Plan Note (Signed)
Assessment: Pt with history of hypertension not on anti-hypertensive therapy who presents with BP of 153/89.   Plan:  -BP 153/89 not at goal <140/90  -Would start lisinopril in setting of proteinuria however pt declines starting anti-hypertensive therapy

## 2016-02-01 NOTE — Assessment & Plan Note (Signed)
Assessment: Pt with Class 1 obesity with BMI of 33.96.   Plan:  -BMI 33.96 not at goal <25  -Encourage lifestyle modification  -Sophia Cox will help with weight loss

## 2016-02-01 NOTE — Assessment & Plan Note (Signed)
Assessment: Pt with last A1c of 5.5 on 11/14/14 previously on oral hypoglycemic and GLP-1 agonist therapy now diet controlled with no recent hypoglycemic events who presents with CBG of 292 and worsened A1c of 9.4.   Plan: -A1c 9.4 not at goal <7, restart byetta 5 mcg SQ BID for 1 month then increase to 10 mcg BID. Start metformin 1000 mg daily for 1 week then increase to BID.  -BP 153/89 not at goal <140/90, pt declines starting anti-hypertensive therapy -Obtain annual lipid panel and direct LDL, pt declines starting statin therapy, encourage lifestyle modification -Pt follows with outside facility for eye exam, will need to obtain records, reports history of MD -Perform annual foot exam today -Obtain annual microalbumin test ---> 68.5 mg of proteinuria, will need to repeat at next visit  -BMI 33.96 not at goal <25, encourage weight loss

## 2016-02-01 NOTE — Assessment & Plan Note (Addendum)
-  Pt declines zoster, tdap, PCV13, and flu vaccinations -Pt declines screening mammogram -Inquire regarding DEXA scan at next visit -Obtain screening Hepatitis C Ab (born b/w 1945-65) --> negative -Obtain CBC at next visit

## 2016-02-01 NOTE — Assessment & Plan Note (Addendum)
Assessment: Pt with DM with last lipid panel on 08/16/13 with LDL 116 and TG 472 with recommendations to start high intensity statin therapy (10 yr ASCVD risk > 7.5%)  due to being in statin benefit group (DM with LDL >70).  Plan:  -Obtain annual lipid panel and direct LDL -Pt declines starting statin therapy  -Encourage lifestyle modification

## 2016-02-03 ENCOUNTER — Telehealth: Payer: Self-pay | Admitting: Dietician

## 2016-02-03 NOTE — Telephone Encounter (Signed)
Asked patient to call her pharmacy back ans ask them if part of that 285 is her annual deductible. She agreed to do that and if part of it is her deductible she may consider getting it because she likes Byetta. She agreed to call us back if she is unable to get /afford the Byetta.

## 2016-02-03 NOTE — Progress Notes (Signed)
Medicine attending: Medical history, presenting problems, physical findings, and medications, reviewed with resident physician Dr Marjan Rabbani on the day of the patient visit and I concur with her evaluation and management plan. 

## 2016-02-03 NOTE — Telephone Encounter (Signed)
Patient called back- she does not have a deductible. It sounds like Victoza is preferred and affordable. It will cost her 35.00$ pr month.Marland Kitchen

## 2016-02-04 ENCOUNTER — Other Ambulatory Visit: Payer: Self-pay | Admitting: Internal Medicine

## 2016-02-04 DIAGNOSIS — E1142 Type 2 diabetes mellitus with diabetic polyneuropathy: Secondary | ICD-10-CM

## 2016-02-04 LAB — LDL CHOLESTEROL, DIRECT: LDL Direct: 91 mg/dL (ref 0–99)

## 2016-02-04 LAB — SPECIMEN STATUS REPORT

## 2016-02-04 MED ORDER — LIRAGLUTIDE 18 MG/3ML ~~LOC~~ SOPN: PEN_INJECTOR | SUBCUTANEOUS | Status: DC

## 2016-02-05 NOTE — Progress Notes (Addendum)
Called patient and she had already started the [place]. She had no questions or concerns

## 2016-02-17 DIAGNOSIS — E049 Nontoxic goiter, unspecified: Secondary | ICD-10-CM | POA: Diagnosis not present

## 2016-02-17 DIAGNOSIS — E039 Hypothyroidism, unspecified: Secondary | ICD-10-CM | POA: Diagnosis not present

## 2016-04-24 ENCOUNTER — Telehealth: Payer: Self-pay | Admitting: Licensed Clinical Social Worker

## 2016-04-24 NOTE — Telephone Encounter (Signed)
CSW placed call to pt's pharmacy to request a 6 month refill history for patient.  Requesting the history to be faxed to [phone].  Contact information provided. 

## 2016-04-28 NOTE — Telephone Encounter (Signed)
Second request to receive 6 month refill history for patient.  Requested history to be faxed to [phone].

## 2016-04-30 DIAGNOSIS — H26491 Other secondary cataract, right eye: Secondary | ICD-10-CM | POA: Diagnosis not present

## 2016-04-30 DIAGNOSIS — H524 Presbyopia: Secondary | ICD-10-CM | POA: Diagnosis not present

## 2016-04-30 DIAGNOSIS — H353131 Nonexudative age-related macular degeneration, bilateral, early dry stage: Secondary | ICD-10-CM | POA: Diagnosis not present

## 2016-04-30 DIAGNOSIS — H10413 Chronic giant papillary conjunctivitis, bilateral: Secondary | ICD-10-CM | POA: Diagnosis not present

## 2016-05-01 ENCOUNTER — Encounter: Payer: BLUE CROSS/BLUE SHIELD | Admitting: Internal Medicine

## 2016-05-06 ENCOUNTER — Other Ambulatory Visit: Payer: Self-pay | Admitting: Internal Medicine

## 2016-05-13 ENCOUNTER — Ambulatory Visit (INDEPENDENT_AMBULATORY_CARE_PROVIDER_SITE_OTHER): Payer: Medicare Other | Admitting: Internal Medicine

## 2016-05-13 ENCOUNTER — Encounter: Payer: Self-pay | Admitting: Internal Medicine

## 2016-05-13 VITALS — BP 148/86 | HR 84 | Temp 98.1°F | Ht 66.0 in | Wt 194.7 lb

## 2016-05-13 DIAGNOSIS — Z79899 Other long term (current) drug therapy: Secondary | ICD-10-CM

## 2016-05-13 DIAGNOSIS — E1142 Type 2 diabetes mellitus with diabetic polyneuropathy: Secondary | ICD-10-CM | POA: Diagnosis present

## 2016-05-13 DIAGNOSIS — I10 Essential (primary) hypertension: Secondary | ICD-10-CM

## 2016-05-13 DIAGNOSIS — Z7984 Long term (current) use of oral hypoglycemic drugs: Secondary | ICD-10-CM

## 2016-05-13 DIAGNOSIS — K219 Gastro-esophageal reflux disease without esophagitis: Secondary | ICD-10-CM

## 2016-05-13 LAB — POCT GLYCOSYLATED HEMOGLOBIN (HGB A1C): HEMOGLOBIN A1C: 6.4

## 2016-05-13 LAB — GLUCOSE, CAPILLARY: Glucose-Capillary: 100 mg/dL — ABNORMAL HIGH (ref 65–99)

## 2016-05-13 MED ORDER — METFORMIN HCL ER 500 MG PO TB24: 1000.0000 mg | ORAL_TABLET | Freq: Every day | ORAL | Status: DC

## 2016-05-13 MED ORDER — LIRAGLUTIDE 18 MG/3ML ~~LOC~~ SOPN: PEN_INJECTOR | SUBCUTANEOUS | Status: DC

## 2016-05-13 MED ORDER — OMEPRAZOLE 20 MG PO CPDR: 20.0000 mg | DELAYED_RELEASE_CAPSULE | Freq: Every day | ORAL | Status: DC

## 2016-05-13 NOTE — Progress Notes (Signed)
Subjective:   Patient ID: Sophia Cox female   DOB: 1946/09/04 70 y.o.   MRN: [id number]  HPI: Sophia Cox is a 70 y.o. woman with PMHx as detailed below presenting for 3 month follow up of her diabetes and for medication refills. She has been feeling in good health. She noticed moderate symptoms of diarrhea when restarting her liraglutide and metformin that resolved within 2 weeks after taking them. She has also lost several pounds over the interval she attributes to being fairly active raising animals outdoors and compliance with her diet and medications.  See problem based assessment and plan below for additional details.  Past Medical History  Diagnosis Date  . Hypertension   . Supraventricular tachycardia ([place])   . Pneumonia [phone]    "quit a few times" (06/21/2013)  . Hypothyroidism   . Type II diabetes mellitus ([place])   . PQDIYMEB(583.0)     "weekly" (06/21/2013)  . Arthritis     "left knee" (06/21/2013)   Current Outpatient Prescriptions  Medication Sig Dispense Refill  . ASHWAGANDHA PO Take by mouth.    . Blood Glucose Monitoring Suppl (BAYER CONTOUR MONITOR) W/DEVICE KIT 1 each by Does not apply route 2 (two) times daily at 10 AM and 5 PM. 1 kit 0  . cholecalciferol (VITAMIN D) 1000 units tablet Take 1,000 Units by mouth daily.    Marland Kitchen glucose blood test strip Use to test blood glucose 2 times daily. Dx: 250.00 100 each 12  . Insulin Pen Needle (BD PEN NEEDLE NANO U/F) 32G X 4 MM MISC Use to inject insulin 2 times daily. diag code 250.00.insulin dependent 100 each 6  . levothyroxine (SYNTHROID, LEVOTHROID) 75 MCG tablet Take 1 tablet (75 mcg total) by mouth daily. 30 tablet 5  . Liraglutide 18 MG/3ML SOPN 1.2 mg daily 6 mL 2  . metFORMIN (GLUCOPHAGE XR) 500 MG 24 hr tablet Take 2 tablets (1,000 mg total) by mouth daily with breakfast. 60 tablet 2  . omeprazole (PRILOSEC) 20 MG capsule Take 1 capsule (20 mg total) by mouth daily. 30 capsule 2  .  Turmeric 500 MG CAPS Take by mouth.     No current facility-administered medications for this visit.   Family History  Problem Relation Age of Onset  . Lung cancer Mother   . Stroke Father   . Diabetes Father   . Colon cancer Neg Hx   . Esophageal cancer Neg Hx   . Stomach cancer Neg Hx    Social History   Social History  . Marital Status: Married    Spouse Name: N/A  . Number of Children: 3  . Years of Education: N/A   Occupational History  .  [employer]   Social History Main Topics  . Smoking status: Never Smoker   . Smokeless tobacco: Never Used  . Alcohol Use: 1.8 oz/week    3 Glasses of wine per week  . Drug Use: No  . Sexual Activity: Yes   Other Topics Concern  . None   Social History Narrative   Review of Systems: Review of Systems  Constitutional: Negative for fever.  Eyes: Negative for blurred vision.  Respiratory: Negative for shortness of breath.   Cardiovascular: Negative for chest pain and leg swelling.  Gastrointestinal: Positive for diarrhea.  Genitourinary: Negative for frequency.  Neurological: Negative for headaches.    Objective:  Physical Exam: Filed Vitals:   05/13/16 1027  BP: 148/86  Pulse: 84  Temp: 98.1 F (36.7  C)  TempSrc: Oral  Height: _0  (1.676 m)  Weight: 194 lb 11.2 oz (88.315 kg)  SpO2: 98%   GENERAL- alert, co-operative, NAD HEENT- Atraumatic, PERRL, EOMI, oral mucosa appears moist, good and intact dentition CARDIAC- RRR, no murmurs, rubs or gallops. RESP- CTAB, no wheezes or crackles. ABDOMEN- Soft, nontender, no guarding or rebound, normoactive bowel sounds present EXTREMITIES- symmetric, no pedal edema. SKIN- Warm, dry, No rash or lesion. NEURO- Normal sensation to light touch on extremities PSYCH- Normal mood and affect, appropriate thought content and speech.  Assessment & Plan:

## 2016-05-13 NOTE — Patient Instructions (Signed)
You are doing an excellent job with your diabetes today!  I recommend changing your metformin to a longer acting dose that is intended to be taken once daily. Take 2 pills (1000mg ) in the mornings. Otherwise continue taking your medications as before. We will plan to see you again in about 3 months for your next follow up.

## 2016-05-14 NOTE — Assessment & Plan Note (Signed)
Her symptoms are much improved after resuming prilosec daily. Had some GI upset when changing medicines a few months ago otherwise stable.  -Refill at 20mg  daily

## 2016-05-14 NOTE — Assessment & Plan Note (Signed)
Lab Results  Component Value Date   HGBA1C 6.4 05/13/2016   HGBA1C 9.4 01/31/2016   HGBA1C 5.5 11/14/2014     Assessment: Diabetes control: At goal Progress toward A1C goal:  Much improved Comments: She was previously not at goal due to not taking any medications. Now she is doing extremely well on victoza and metformin. She takes all her medications on in the morning saying she will forget them otherwise and so has been taking metformin 2000mg  in the morning rather than 1000mg  BID.  Plan: Medications:  Refilled victoza today, changed prescription for metformin to a glucophage ER since this may give her more benefit if she is not able to follow a BID dosing plan well. Instruction/counseling given: reminded to get eye exam and reminded to bring medications to each visit

## 2016-05-15 NOTE — Progress Notes (Signed)
Medicine attending: Medical history, presenting problems, physical findings, and medications, reviewed with resident physician Dr Christopher Rice on the day of the patient visit and I concur with his evaluation and management plan. 

## 2016-06-02 DIAGNOSIS — H353133 Nonexudative age-related macular degeneration, bilateral, advanced atrophic without subfoveal involvement: Secondary | ICD-10-CM | POA: Diagnosis not present

## 2016-06-02 DIAGNOSIS — H43813 Vitreous degeneration, bilateral: Secondary | ICD-10-CM | POA: Diagnosis not present

## 2016-06-02 DIAGNOSIS — Z961 Presence of intraocular lens: Secondary | ICD-10-CM | POA: Diagnosis not present

## 2016-06-02 LAB — HM DIABETES EYE EXAM

## 2016-06-09 ENCOUNTER — Encounter: Payer: Self-pay | Admitting: *Deleted

## 2016-06-23 ENCOUNTER — Other Ambulatory Visit: Payer: Self-pay | Admitting: *Deleted

## 2016-06-23 ENCOUNTER — Encounter: Payer: Self-pay | Admitting: Internal Medicine

## 2016-06-23 DIAGNOSIS — E1142 Type 2 diabetes mellitus with diabetic polyneuropathy: Secondary | ICD-10-CM

## 2016-06-23 MED ORDER — INSULIN PEN NEEDLE 32G X 4 MM MISC: Status: DC

## 2016-07-27 ENCOUNTER — Other Ambulatory Visit: Payer: Self-pay

## 2016-07-27 DIAGNOSIS — E1142 Type 2 diabetes mellitus with diabetic polyneuropathy: Secondary | ICD-10-CM

## 2016-07-27 NOTE — Telephone Encounter (Signed)
Requesting insulin pen needle to be filled @ [facility] on [address].

## 2016-07-29 NOTE — Telephone Encounter (Signed)
Called pharmacy, patient has picked up

## 2016-08-02 DIAGNOSIS — J069 Acute upper respiratory infection, unspecified: Secondary | ICD-10-CM | POA: Diagnosis not present

## 2016-08-02 DIAGNOSIS — J3089 Other allergic rhinitis: Secondary | ICD-10-CM | POA: Diagnosis not present

## 2016-08-13 ENCOUNTER — Ambulatory Visit (INDEPENDENT_AMBULATORY_CARE_PROVIDER_SITE_OTHER): Payer: Medicare Other | Admitting: Internal Medicine

## 2016-08-13 ENCOUNTER — Encounter: Payer: Self-pay | Admitting: Internal Medicine

## 2016-08-13 VITALS — BP 138/85 | Temp 98.5°F | Ht 65.0 in | Wt 191.2 lb

## 2016-08-13 DIAGNOSIS — Z79899 Other long term (current) drug therapy: Secondary | ICD-10-CM | POA: Diagnosis not present

## 2016-08-13 DIAGNOSIS — I10 Essential (primary) hypertension: Secondary | ICD-10-CM | POA: Diagnosis not present

## 2016-08-13 DIAGNOSIS — E039 Hypothyroidism, unspecified: Secondary | ICD-10-CM

## 2016-08-13 DIAGNOSIS — E038 Other specified hypothyroidism: Secondary | ICD-10-CM

## 2016-08-13 DIAGNOSIS — J301 Allergic rhinitis due to pollen: Secondary | ICD-10-CM

## 2016-08-13 DIAGNOSIS — J309 Allergic rhinitis, unspecified: Secondary | ICD-10-CM | POA: Diagnosis not present

## 2016-08-13 MED ORDER — CETIRIZINE HCL 10 MG PO CAPS: 10.0000 mg | ORAL_CAPSULE | ORAL | 0 refills | Status: DC

## 2016-08-13 NOTE — Patient Instructions (Signed)
It was a pleasure meeting you today! I'm sorry you are still not feeling well.   1. Today we talked about your symptoms of congestion, post-nasal drip, sore throat and feeling tired. This sounds like allergic rhinitis, especially with your history of similar symptoms each summer since moving to the area. For this I have sent a prescription for Cetirizine (Zyrtec) 10 mg to your pharmacy. Please take one pill once a day.  2. Today we also talked about using a Neti Pot or sinus irrigation. These can be found at your local pharmacy. Please remember to use clean, room temperature water.  3. We also talked about using Flonase. You may use this once a day for 2 weeks. Please remember the proper way to use it when administrating this medication.  4. Please see your thyroid doctor for management of your hypothyroidism.  5. Please return here to the clinic if your symptoms are not improving after 1-2 weeks.

## 2016-08-14 NOTE — Progress Notes (Signed)
   CC: 2 week history of sneezing, post-nasal drip.   HPI:  Sophia Cox is a 70 y.o. female who presents to the clinic for evaluation of a 2 week history of sneezing, post-nasal drip, sore throat and feeling fatigued. The patient reports she gets symptoms similar to this each year during the summer since moving to [place] 7 years ago. She endorses a distant history of allergies. She was seen in an urgent care clinic a few days ago who diagnosed her with allergic rhinitis and sent her home on prednisone, an inhaler and mucinex. She has found no relief with these medications. She has not tried any OTC antihistamines or flonase before.  Denies any fevers, chills, productive cough, myalgias, abdominal pain or diarrhea.   Past Medical History:  Diagnosis Date  . Arthritis    "left knee" (06/21/2013)  . [id number])    "weekly" (06/21/2013)  . Hypertension   . Hypothyroidism   . Pneumonia [phone]   "quit a few times" (06/21/2013)  . Supraventricular tachycardia ([place])   . Type II diabetes mellitus ([place])     Review of Systems:  Review of Systems  Constitutional: Positive for malaise/fatigue. Negative for chills and fever.  HENT: Positive for congestion and sore throat. Negative for ear pain.   Respiratory: Positive for cough. Negative for shortness of breath and wheezing.   Cardiovascular: Negative for chest pain and leg swelling.  Gastrointestinal: Negative for abdominal pain, diarrhea, heartburn, nausea and vomiting.  Genitourinary: Negative for dysuria and frequency.  Musculoskeletal: Negative for myalgias and neck pain.  Skin: Negative for itching and rash.  Neurological: Positive for headaches.  Endo/Heme/Allergies: Positive for environmental allergies.    Physical Exam: Physical Exam  Constitutional: She is well-developed, well-nourished, and in no distress.  HENT:  Head: Normocephalic and atraumatic.  Right Ear: External ear normal.  Left Ear: External ear normal.    Evidence consistent with post-nasal drip.  No sinus tenderness.  Eyes: Conjunctivae are normal. Right eye exhibits no discharge. Left eye exhibits no discharge. No scleral icterus.  Cardiovascular: Normal rate and regular rhythm.   Pulmonary/Chest: Effort normal and breath sounds normal. No respiratory distress. She has no wheezes.  Abdominal: Soft. Bowel sounds are normal. She exhibits no distension. There is no tenderness.  Lymphadenopathy:    She has no cervical adenopathy.  Skin: She is not diaphoretic.    Vitals:   08/13/16 1337  BP: 138/85  Temp: 98.5 F (36.9 C)  TempSrc: Oral  SpO2: 100%  Weight: 191 lb 3.2 oz (86.7 kg)  Height: 5\' 5"  (1.651 m)     Assessment & Plan:   See Encounters Tab for problem based charting.  Patient seen with Dr. Evette Doffing

## 2016-08-14 NOTE — Progress Notes (Signed)
Internal Medicine Clinic Attending  I saw and evaluated the patient.  I personally confirmed the key portions of the history and exam documented by Dr. Molt and I reviewed pertinent patient test results.  The assessment, diagnosis, and plan were formulated together and I agree with the documentation in the resident's note. 

## 2016-08-14 NOTE — Assessment & Plan Note (Signed)
Patient has history of HTN and is controlled without use of antihypertensive medications. -Continue dietary control

## 2016-08-14 NOTE — Assessment & Plan Note (Signed)
Patient follows with an endocrinologist. Last visit approximately 2 months ago. At that time her physician offered to increase her dose of Synthroid as she was feeling cold and fatigued but decided against this at that time. She continues to complain of the same symptoms and wonders if her synthroid should be increased. -instructed to follow up with her thyroid physician for this concern

## 2016-08-14 NOTE — Assessment & Plan Note (Signed)
Patients symptoms and presentation consistent with allergic rhinitis. She has yet to try an antihistamine for control of her symptoms.  -Cetirizine 10 mg daily prescribed -Good discussion was had the patient about using adjunct therapies like sinus irrigation and filters that decrease circulating pollen in the home. -Patient may take Flonase for decongestion

## 2016-08-18 ENCOUNTER — Other Ambulatory Visit: Payer: Self-pay | Admitting: Internal Medicine

## 2016-08-18 DIAGNOSIS — E1142 Type 2 diabetes mellitus with diabetic polyneuropathy: Secondary | ICD-10-CM

## 2016-08-24 ENCOUNTER — Other Ambulatory Visit: Payer: Self-pay | Admitting: Internal Medicine

## 2016-08-24 DIAGNOSIS — E1142 Type 2 diabetes mellitus with diabetic polyneuropathy: Secondary | ICD-10-CM

## 2016-09-09 ENCOUNTER — Other Ambulatory Visit: Payer: Self-pay | Admitting: *Deleted

## 2016-09-09 DIAGNOSIS — J301 Allergic rhinitis due to pollen: Secondary | ICD-10-CM

## 2016-09-10 ENCOUNTER — Encounter: Payer: Self-pay | Admitting: Family Medicine

## 2016-09-10 ENCOUNTER — Ambulatory Visit (INDEPENDENT_AMBULATORY_CARE_PROVIDER_SITE_OTHER): Payer: Medicare Other | Admitting: Family Medicine

## 2016-09-10 VITALS — BP 146/84 | HR 71 | Ht 65.0 in | Wt 190.9 lb

## 2016-09-10 DIAGNOSIS — E669 Obesity, unspecified: Secondary | ICD-10-CM

## 2016-09-10 DIAGNOSIS — E1142 Type 2 diabetes mellitus with diabetic polyneuropathy: Secondary | ICD-10-CM

## 2016-09-10 DIAGNOSIS — E1129 Type 2 diabetes mellitus with other diabetic kidney complication: Secondary | ICD-10-CM | POA: Insufficient documentation

## 2016-09-10 DIAGNOSIS — Z9119 Patient's noncompliance with other medical treatment and regimen: Secondary | ICD-10-CM | POA: Diagnosis not present

## 2016-09-10 DIAGNOSIS — E785 Hyperlipidemia, unspecified: Secondary | ICD-10-CM

## 2016-09-10 DIAGNOSIS — E0829 Diabetes mellitus due to underlying condition with other diabetic kidney complication: Secondary | ICD-10-CM

## 2016-09-10 DIAGNOSIS — Z91199 Patient's noncompliance with other medical treatment and regimen due to unspecified reason: Secondary | ICD-10-CM

## 2016-09-10 DIAGNOSIS — I150 Renovascular hypertension: Secondary | ICD-10-CM

## 2016-09-10 DIAGNOSIS — E038 Other specified hypothyroidism: Secondary | ICD-10-CM

## 2016-09-10 DIAGNOSIS — R809 Proteinuria, unspecified: Secondary | ICD-10-CM

## 2016-09-10 DIAGNOSIS — K219 Gastro-esophageal reflux disease without esophagitis: Secondary | ICD-10-CM

## 2016-09-10 LAB — POCT GLYCOSYLATED HEMOGLOBIN (HGB A1C): Hemoglobin A1C: 6

## 2016-09-10 MED ORDER — CETIRIZINE HCL 10 MG PO CAPS: 10.0000 mg | ORAL_CAPSULE | ORAL | 0 refills | Status: DC

## 2016-09-10 NOTE — Patient Instructions (Addendum)
Did discuss diet and exercise as well as weight loss to help with her blood pressure . She wants to work on this, as she completely declines a blood pressure medication today.   Diabetes and Exercise Exercising regularly is important. It is not just about losing weight. It has many health benefits, such as:  Improving your overall fitness, flexibility, and endurance.  Increasing your bone density.  Helping with weight control.  Decreasing your body fat.  Increasing your muscle strength.  Reducing stress and tension.  Improving your overall health. People with diabetes who exercise gain additional benefits because exercise:  Reduces appetite.  Improves the body's use of blood sugar (glucose).  Helps lower or control blood glucose.  Decreases blood pressure.  Helps control blood lipids (such as cholesterol and triglycerides).  Improves the body's use of the hormone insulin by:  Increasing the body's insulin sensitivity.  Reducing the body's insulin needs.  Decreases the risk for heart disease because exercising:  Lowers cholesterol and triglycerides levels.  Increases the levels of good cholesterol (such as high-density lipoproteins [HDL]) in the body.  Lowers blood glucose levels. YOUR ACTIVITY PLAN  Choose an activity that you enjoy, and set realistic goals. To exercise safely, you should begin practicing any new physical activity slowly, and gradually increase the intensity of the exercise over time. Your health care provider or diabetes educator can help create an activity plan that works for you. General recommendations include:  Encouraging children to engage in at least 60 minutes of physical activity each day.  Stretching and performing strength training exercises, such as yoga or weight lifting, at least 2 times per week.  Performing a total of at least 150 minutes of moderate-intensity exercise each week, such as brisk walking or water  aerobics.  Exercising at least 3 days per week, making sure you allow no more than 2 consecutive days to pass without exercising.  Avoiding long periods of inactivity (90 minutes or more). When you have to spend an extended period of time sitting down, take frequent breaks to walk or stretch. RECOMMENDATIONS FOR EXERCISING WITH TYPE 1 OR TYPE 2 DIABETES   Check your blood glucose before exercising. If blood glucose levels are greater than 240 mg/dL, check for urine ketones. Do not exercise if ketones are present.  Avoid injecting insulin into areas of the body that are going to be exercised. For example, avoid injecting insulin into:  The arms when playing tennis.  The legs when jogging.  Keep a record of:  Food intake before and after you exercise.  Expected peak times of insulin action.  Blood glucose levels before and after you exercise.  The type and amount of exercise you have done.  Review your records with your health care provider. Your health care provider will help you to develop guidelines for adjusting food intake and insulin amounts before and after exercising.  If you take insulin or oral hypoglycemic agents, watch for signs and symptoms of hypoglycemia. They include:  Dizziness.  Shaking.  Sweating.  Chills.  Confusion.  Drink plenty of water while you exercise to prevent dehydration or heat stroke. Body water is lost during exercise and must be replaced.  Talk to your health care provider before starting an exercise program to make sure it is safe for you. Remember, almost any type of activity is better than none.   This information is not intended to replace advice given to you by your health care provider. Make sure you discuss any  questions you have with your health care provider.   Document Released: 02/20/2004 Document Revised: 04/16/2015 Document Reviewed: 05/09/2013 Elsevier Interactive Patient Education 2016 [employer].   How to Protect Your  Kidneys When You Have Diabetes By [place] WebMD Feature Reviewed by Clemon Chambers, MD WebMD Feature Archive When you have diabetes, it's key to take care of your kidneys. They do an important job filtering waste and removing it from your blood. Diabetes can hurt the kidneys and cause them to stop working. It's the main cause of kidney failure.  How Diabetes Damages Kidneys With diabetes, you have high blood sugar. Those high levels make your kidneys work extra hard to filter your blood. Over time, working that hard can damage your kidneys, causing small amounts of protein to leak into your urine.  Damage can get worse, and more protein leaks into your urine. Your blood pressure can start to rise. Waste materials will build up in your blood. If you don't treat it, your kidneys won't work anymore. If your kidneys fail completely, you'll need to have your blood filtered by a machine (dialysis) or have a kidney transplant.  Can You Tell If You Have Kidney Damage? There are very few symptoms of kidney disease until your kidneys have almost stopped working.  One of the earliest signs is fluid buildup. You might have swollen ankles, weight gain, or you may pee more often. You may also have a hard time sleeping or concentrating. You may not be hungry, or you may have an upset stomach. You may just feel weak.  These symptoms aren't very specific. That's why it's important to see your doctor regularly for these kidney-related tests if you have diabetes.  Blood pressure. High blood pressure can be a sign of kidney problems. You should have yours checked at every health care visit. Your doctor will tell you what levels are healthy for you. Blood tests. The estimated glomerular filtration rate (eGFR) checks how well your kidneys are able to filter your blood. Urine tests. Your doctor will check for levels of protein, creatinine, and albumin. These levels can show signs of  kidney damage. Treatment for Kidney Disease If you have diabetes and kidney damage, there are things you can do to treat it and stop it from getting worse.  Lifestyle changes. Eating healthier and getting regular exercise can improve your blood glucose, blood pressure, and cholesterol. That will affect how hard your kidneys have to work. Your doctor may suggest cutting back on protein, salt, and fat in your diet.  It's also smart to watch how much alcohol you drink. If you smoke, stop.  Medicine. Drugs called ACE inhibitors and angiotensin receptor blockers (ARBs) can help control your blood pressure and slow kidney damage if you have diabetes. Be careful about taking too many painkillers. Using nonsteroidal anti-inflammatory drugs (NSAIDs) like ibuprofen and naproxen every day can lead to kidney damage.  Monitoring. Check your blood glucose regularly at home so you can keep your diabetes in check. You may also want to track your blood pressure so you can control it if it gets high.   SOURCES: American Diabetes Association: "Kidney Disease (Nephropathy)." National Kidney and Urologic Diseases Information Clearinghouse: "Kidney Disease of Diabetes." National Kidney and Urologic Diseases Information Clearinghouse: "Prevent Diabetes: Keep Your Kidneys Healthy." National Kidney Foundation: "Diabetes - A Major Risk Factor for Kidney Disease." Reviewed on January 24, 2014  2015 WebMD, [place]. All rights reserved.    Should you  take a statin even if your cholesterol is normal?  Consider this option after determining your personal risk with your doctor.  Statin drugs reduce LDL ("bad") cholesterol, the type that puts you at risk for cardiovascular disease. But even if your cholesterol is not particularly high, it could still be smart to consider starting a statin.  Drugs have risks, and doctors are wary of giving them to healthy people to prevent future illness unless the benefits are clearly  established. Besides, critics say, we already have a safe and effective way to prevent cardiovascular disease: a healthy diet and regular exercise. They also think the evidence for preventive statin use isn't entirely established yet.  Cardiologist Dr. Anette Riedel, a professor of medicine at [facility], disagrees. He was the lead scientist on the landmark JUPITER trial, which showed that statins might offer a substantial benefit to people with normal or even low levels of cholesterol, but certain other risk factors. And he thinks the evidence is good enough for doctors to consider offering preventive statins to more people.  "Multiple randomized clinical trials tell us that statin therapy can reduce the risk of heart attacks and strokes by 25% to 50%, even among individuals with normal or low cholesterol," says Dr. Guss Bunde, [place] for Cardiovascular Disease Prevention at [facility] and [facility]. "People at high risk should talk to their physicians about whether they should take these agents-not as a replacement for diet, exercise, and smoking cessation, but as an additional preventive therapy."  What the science says  In a study earlier this year in The Lancet, researchers pooled the results of past research involving about 174,000 people. They found that people who took a statin and lowered their cholesterol by 40 milligrams per deciliter (mg/dL) cut their overall risk of cardiovascular "events" by about 20% over a period of five years. The events included nonfatal heart attacks and strokes, treatment to restore blood flow in a blocked coronary artery, or death due to cardiovascular disease.  The 20% lower risk could translate into 11 fewer heart attacks for every 1,000 people who lower their LDL by 40 mg/dL-again, over a period of five years. This absolute benefit may seem modest. "But over 20 years it's a substantial risk reduction that we need to be  honest with our patients about," Dr. Guss Bunde says.  Statin benefits  In the Lancet study, people on statins did better even if they were at relatively low risk of cardiovascular disease. Why? Ridker's JUPITER trial, which concluded in 2008, hinted at an answer.  The Justification for the Use of Statins in Primary Prevention: An Intervention Trial Evaluating Rosuvastatin (JUPITER) study gave statins to people with normal to low cholesterol but high levels of C-reactive protein (CRP). CRP is a marker in the blood for inflammation, which is thought to play a key role in cardiovascular disease.  In JUPITER, taking the statin rosuvastatin (Crestor) reduced the risk of a first heart attack or stroke by 50% and the risk of death by 20%. "Statins appear to be 'two-fers' that lower cholesterol and inhibit vascular inflammation, and both are powerful determinants of developing heart attack and stroke," Dr. Guss Bunde says.  Because of JUPITER's design, the scientists could not distinguish whether the benefits of statin therapy are due to cholesterol reduction or to both lowering cholesterol and suppressing inflammation. However, the trial lends support to the "two-fer" theory. "Fifty percent of heart attacks and strokes happen in people with average or normal-to-low levels of  cholesterol," Dr. Guss Bunde notes. "Cholesterol remains crucially important, but it can't be the only way we think about this disease."  Preventive statins for you?  A statin might save your life if you have been diagnosed with cardiovascular disease, have suffered a heart attack or stroke, have peripheral vascular disease, have undergone coronary artery bypass or stenting to treat coronary artery disease, or have diabetes.  People with these conditions would be advised to start taking a statin unless there is a clear medical reason why they cannot. As for the rest of [place], there are some generally accepted guidelines, although they may change as  more clinical trial data come out in the near future.  Based on JUPITER and other studies, Ridker advocates a simple guideline for prescribing preventive statins. If you are older than 50 and have high LDL, low HDL (the "good" cholesterol), or high CRP, clinical trials have shown that statins provide a net benefit, so consider discussing these drugs with your doctor. Again, statins should never take the place of exercise and a healthy diet, although many doctors turn to statins after lifestyle change fails.  Figuring your risk  The decision to take a preventive statin is a judgment call based on your estimated risk of suffering a heart attack or death from heart disease in the coming decade. The decision is made based on your personal characteristics and health history. (You can determine your heart attack risk using several online calculators available at health.TransferLive.se.) If the risk is 1%, then only one in 100 people with that risk level will have a heart attack or die in the next 10 years. No one would prescribe a statin to such a person.  However, if your risk is around 10% or higher, many doctors would seriously consider a statin. If the risk is 20%, then you would be on the short list. If you choose this option, understand that you will probably take it for life, because the risk of heart disease only rises with age.  Risks of statins  No medication comes without risks, and statins are no different. The most commonly reported side effect is muscle pain or weakness. Statins can also slightly increase blood sugar and therefore the chance of being diagnosed with diabetes. Some people have reported sudden and severe memory impairment while taking statins. On the other hand, clinical trials results show that the risk of muscle pain and diabetes are clearly outweighed by the benefits of statins. And the memory problems are rare and poorly documented.  Statins and older men   Still, not every  older man needs a statin. If you have normal cholesterol and blood pressure and are not overweight, you are probably not in the statin zone. However, if you are in your 74s and have multiple other major risk factors, you might be. As with many controversial topics in medicine, the final decision is yours.  "Once we move beyond diet and exercise, all things in preventive medicine are a balance between individual benefit and risk, so it's up to you and your physician to decide whether a given therapy is right for you," Dr. Guss Bunde says. "But in order to make that decision, you have to know what your risks are and what the benefits are."

## 2016-09-10 NOTE — Assessment & Plan Note (Signed)
Patient follows with an endocrinologist.

## 2016-09-10 NOTE — Assessment & Plan Note (Addendum)
15 yrs or so. For many yrs- poorly controloled- 600+ BS and never checked.    [place]etta for yrs in recent past with very good control but patient could not afford it.  A1c 6.0 today.

## 2016-09-10 NOTE — Progress Notes (Signed)
New patient office visit note:  Impression and Recommendations:    1. Type 2 diabetes mellitus with peripheral neuropathy (HCC)   2. Other specified hypothyroidism   3. Renovascular hypertension   4. Hyperlipidemia   5. Gastroesophageal reflux disease, esophagitis presence not specified   6. Obesity   7. Diabetes mellitus due to underlying condition, controlled, with microalbuminuria, without long-term current use of insulin (HCC)     A1c at goal at 6.0 today. Continue medications.  TSH has been stable for over 2 years now. Declines blood work. Last checked in February 2017.Marland Kitchen  Blood pressure not at goal: Patient declines an ARB for her blood pressure or any other blood pressure medication today. Microalbumin to creatinine ratio was increased.   She understands the risks. Educational handouts provided.  Patient also declines statin medication for her increased cholesterol. She understands the risks of increasing heart attack and stroke. Educational handouts provided.  Recommend weight loss as patient understands how this will help all of her chronic medical conditions improve  Very resistant to medications and very resistant to change.  Only her doctor has been trying to get her on those medicines for years and has been unsuccessful. - We will follow up in 3 months.   Hypothyroidism Patient follows with an endocrinologist.   Type 2 diabetes mellitus with peripheral neuropathy ([place]) 15 yrs or so. For many yrs- poorly controloled- 600+ BS and never checked.    [place] for yrs in recent past with very good control but patient could not afford it.  A1c 6.0 today.       Orders Placed This Encounter  Procedures  . POCT glycosylated hemoglobin (Hb A1C)    New Prescriptions   No medications on file    Modified Medications   No medications on file    Discontinued Medications   ASHWAGANDHA PO    Take by mouth.    Return in about 3 months (around 12/10/2016)  for Follow-up of current medical issues.  The patient was counseled, risk factors were discussed, anticipatory guidance given.  Gross side effects, risk and benefits, and alternatives of medications discussed with patient.  Patient is aware that all medications have potential side effects and we are unable to predict every side effect or drug-drug interaction that may occur.  Expresses verbal understanding and consents to current therapy plan and treatment regimen.  Pt was in the office today for 40+ minutes, with over 50% time spent in face to face counseling of various medical concerns and in coordination of care  Please see AVS handed out to patient at the end of our visit for further patient instructions/ counseling done pertaining to today's office visit.    Note: This document was prepared using Dragon voice recognition software and may include unintentional dictation errors.  -------------------------------------------------------------------------------------------------------------------   Subjective:    Chief Complaint  Patient presents with  . Establish Care    HPI: Sophia Cox is a pleasant 70 y.o. female who presents to [place] [facility] today to review their medical history with me and establish care.   I asked the patient to review their chronic problem list with me to ensure everything was updated and accurate.     [place] here 7 yrs ago.  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   Married to Lyman. 3 children ages 80, 53, 72.  Medical noncompliance:  Patient admits she does not like to take medications-  Especially for blood pressure, cholesterol or others.    DM:  diabetes for 15 years or so and she admits it was very poorly controlled for several years up until just a couple years ago, due to noncompliance. Her sugars prior work in the 500- 600 range several years.      Hypertension:  Patient states she's not going on any medicine and will control it through diet and exercise.   Proteinuria present.  Patient declines medications which might help lessen progression of this disease.  She told me she does not come in for preventative healthcare and declines yearly physicals.  Explained importance of this but patient says she will let us know when she is due for colonoscopy, mammo, etc.   Wt Readings from Last 3 Encounters:  09/10/16 190 lb 14.4 oz (86.6 kg)  08/13/16 191 lb 3.2 oz (86.7 kg)  05/13/16 194 lb 11.2 oz (88.3 kg)   BP Readings from Last 3 Encounters:  09/10/16 (!) 146/84  08/13/16 138/85  05/13/16 (!) 148/86   Pulse Readings from Last 3 Encounters:  09/10/16 71  05/13/16 84  01/31/16 (!) 103   BMI Readings from Last 3 Encounters:  09/10/16 31.77 kg/m  08/13/16 31.82 kg/m  05/13/16 31.43 kg/m    Patient Active Problem List   Diagnosis Date Noted  . Type 2 diabetes mellitus with peripheral neuropathy ([place]) 12/14/2000    Priority: High  . Controlled diabetes mellitus with microalbuminuria, without long-term current use of insulin ([place]) 09/10/2016  . Allergic rhinitis 08/13/2016  . Hyperlipidemia 02/01/2016  . GERD (gastroesophageal reflux disease) 02/01/2016  . Obesity 02/01/2016  . Esophageal stricture 02/01/2016  . Alternating constipation and diarrhea 05/16/2014  . Preventative health care 12/04/2013  . Hypothyroidism 05/18/2013  . SVT (supraventricular tachycardia) ([place]) 04/26/2013  . HTN (hypertension) 04/26/2013     Past Medical History:  Diagnosis Date  . Arthritis    "left knee" (06/21/2013)  . DPOEUMPN(361.4)    "weekly" (06/21/2013)  . Hypertension   . Hypothyroidism   . Pneumonia [phone]   "quit a few times" (06/21/2013)  . Supraventricular tachycardia ([place])   . Type II diabetes mellitus ([place])      Past Surgical History:  Procedure Laterality Date  . CATARACT EXTRACTION Bilateral   .  CHOLECYSTECTOMY  ~ 1989  . SUPRAVENTRICULAR TACHYCARDIA ABLATION  06/21/2013  . SUPRAVENTRICULAR TACHYCARDIA ABLATION N/A 06/21/2013   Procedure: SUPRAVENTRICULAR TACHYCARDIA ABLATION;  Surgeon: Evans Lance, MD;  Location: [facility] CATH LAB;  Service: Cardiovascular;  Laterality: N/A;  . TONSILLECTOMY  ~ 1952  . TUBAL LIGATION  1971     Family History  Problem Relation Age of Onset  . Lung cancer Mother   . Alcohol abuse Mother   . Stroke Father   . Diabetes Father   . Alcohol abuse Father   . Heart attack Father   . Diabetes Paternal Uncle   . Stroke Maternal Grandfather   . Cancer Paternal Grandmother 19    cervical  . Diabetes Paternal Grandmother   . Heart attack Paternal Grandfather   . Colon cancer Neg Hx   . Esophageal cancer Neg Hx   . Stomach cancer Neg Hx      History  Drug Use No    History  Alcohol Use  . 2.4 oz/week  . 4 Glasses of wine per week    History  Smoking Status  . Never Smoker  Smokeless Tobacco  .  Never Used    Patient's Medications  New Prescriptions   No medications on file  Previous Medications   BLOOD GLUCOSE MONITORING SUPPL (BAYER CONTOUR MONITOR) W/DEVICE KIT    1 each by Does not apply route 2 (two) times daily at 10 AM and 5 PM.   CETIRIZINE HCL 10 MG CAPS    Take 1 capsule (10 mg total) by mouth 1 day or 1 dose.   CHOLECALCIFEROL (VITAMIN D) 1000 UNITS TABLET    Take 1,000 Units by mouth daily.   CYANOCOBALAMIN 500 MCG TABLET    Take 500 mcg by mouth daily.   GLUCOSE BLOOD TEST STRIP    Use to test blood glucose 2 times daily. Dx: 250.00   INSULIN PEN NEEDLE (BD PEN NEEDLE NANO U/F) 32G X 4 MM MISC    Use to inject insulin 2 times daily. diag code E11.42.insulin dependent   LEVOTHYROXINE (SYNTHROID, LEVOTHROID) 75 MCG TABLET    Take 1 tablet (75 mcg total) by mouth daily.   METFORMIN (GLUCOPHAGE-XR) 500 MG 24 HR TABLET    TAKE TWO TABLETS BY MOUTH ONCE DAILY WITH  BREAKFAST   OMEPRAZOLE (PRILOSEC) 20 MG CAPSULE    Take 1 capsule  (20 mg total) by mouth daily.   TURMERIC 500 MG CAPS    Take by mouth.   VICTOZA 18 MG/3ML SOPN    INJECT 1.2 MG DAILY  Modified Medications   No medications on file  Discontinued Medications   ASHWAGANDHA PO    Take by mouth.    Allergies: Eggs or egg-derived products and Penicillins  Review of Systems  Constitutional: Negative.  Negative for chills, diaphoresis, fever, malaise/fatigue and weight loss.  HENT: Negative.  Negative for congestion, sore throat and tinnitus.        Allergies, long standing problem  Eyes: Negative.  Negative for blurred vision, double vision and photophobia.  Respiratory: Negative.  Negative for cough and wheezing.   Cardiovascular: Negative.  Negative for chest pain and palpitations.  Gastrointestinal: Negative.  Negative for blood in stool, diarrhea, nausea and vomiting.  Genitourinary: Negative.  Negative for dysuria, frequency and urgency.       Nocturia  Musculoskeletal: Negative.  Negative for joint pain and myalgias.  Skin: Negative.  Negative for itching and rash.  Neurological: Negative.  Negative for dizziness, focal weakness, weakness and headaches.  Endo/Heme/Allergies: Negative.  Negative for environmental allergies and polydipsia. Does not bruise/bleed easily.  Psychiatric/Behavioral: Negative.  Negative for depression and memory loss. The patient is not nervous/anxious and does not have insomnia.      Objective:   Blood pressure (!) 146/84, pulse 71, height _0  (1.651 m), weight 190 lb 14.4 oz (86.6 kg). Body mass index is 31.77 kg/m. General: Well Developed, well nourished, and in no acute distress.  Neuro: Alert and oriented x3, extra-ocular muscles intact, sensation grossly intact.  HEENT: Normocephalic, atraumatic, pupils equal round reactive to light, neck supple Skin: no gross suspicious lesions or rashes  Cardiac: Regular rate and rhythm, no murmurs rubs or gallops.  Respiratory: Essentially clear to auscultation bilaterally.  Not using accessory muscles, speaking in full sentences.  Abdominal: Soft, not grossly distended Musculoskeletal: Ambulates w/o diff, FROM * 4 ext.  Vasc: less 2 sec cap RF, warm and pink  Psych:  No HI/SI, judgement and insight good, Euthymic mood. Full Affect.   Recent Results (from the past 2160 hour(s))  POCT glycosylated hemoglobin (Hb A1C)     Status: Normal   Collection Time:  09/10/16  9:27 AM  Result Value Ref Range   Hemoglobin A1C 6.0

## 2016-10-12 ENCOUNTER — Encounter: Payer: Self-pay | Admitting: *Deleted

## 2016-11-10 ENCOUNTER — Telehealth: Payer: Self-pay

## 2016-11-13 NOTE — Progress Notes (Signed)
Sophia Cox is a 70 y.o. female who was contacted on behalf of [facility] Task Force. Patient states she is doing well with medications and now has a new PCP.

## 2016-12-03 ENCOUNTER — Encounter: Payer: Self-pay | Admitting: Family Medicine

## 2016-12-03 ENCOUNTER — Ambulatory Visit (INDEPENDENT_AMBULATORY_CARE_PROVIDER_SITE_OTHER): Payer: Medicare Other | Admitting: Family Medicine

## 2016-12-03 VITALS — BP 145/82 | HR 84 | Ht 65.0 in | Wt 195.1 lb

## 2016-12-03 DIAGNOSIS — R809 Proteinuria, unspecified: Secondary | ICD-10-CM | POA: Diagnosis not present

## 2016-12-03 DIAGNOSIS — Z9119 Patient's noncompliance with other medical treatment and regimen: Secondary | ICD-10-CM | POA: Diagnosis not present

## 2016-12-03 DIAGNOSIS — Z91199 Patient's noncompliance with other medical treatment and regimen due to unspecified reason: Secondary | ICD-10-CM

## 2016-12-03 DIAGNOSIS — E1129 Type 2 diabetes mellitus with other diabetic kidney complication: Secondary | ICD-10-CM

## 2016-12-03 LAB — POCT GLYCOSYLATED HEMOGLOBIN (HGB A1C): Hemoglobin A1C: 6.3

## 2016-12-03 NOTE — Progress Notes (Signed)
Assessment and Plan:  The patient was counseled, risk factors were discussed, anticipatory guidance given.  -  Patient's noncompliance with other medical treatment and regimen Declines ARB, Statin and others.   Understands risks and is adamant    - Hypertension:  Continue current treatment regimen b/c pt declines meds Importance of ambulatory blood pressure monitoring d/c pt- bring in log nnext OV DASH diet.  Routine exercise   - Cholesterol:  Declines medicine-  Counseling diet and exercise.  Check cholesterol +/- CMET q 4-87mo   - Diabetes:    WELL CONTROLLED Continue current treatment regimen- tol well and A1c 6.3.  Was 6.4 May, 2017.   Pt declines changes to txmnt regimen and wants to stay on all meds as is.  Counseling diet and exercise.  Check A1C q 4-666monless extremely well controlled * 2 eye & foot exams and urine micro albumin yearly   - Vitamin D:   check level and continue medications.    - Weight Mgt: Explained to patient what BMI refers to, and what it means medically.    Told patient to think about it as a "medical risk stratification measurement" and how increasing BMI is associated with increasing risk/ or worsening state of various diseases such as hypertension, hyperlipidemia, diabetes, premature OA, depression etc.  American Heart Association guidelines for healthy diet, basically Mediterranean diet, and exercise guidelines of 30 minutes 5 days per week or more discussed in detail.   -Reminded patient the need for yearly complete physical exam office visits in addition to office visits for management of the chronic diseases   -Gross side effects, risk and benefits, and alternatives of medications discussed with patient.  Patient is aware that all medications have potential side effects and we are unable to predict every side effect or drug-drug interaction that may occur.  Expresses verbal understanding and consents to current therapy plan  and treatment regimen.  Orders Placed This Encounter  Procedures  . POCT glycosylated hemoglobin (Hb A1C)    New Prescriptions   No medications on file    Modified Medications   Modified Medication Previous Medication   LIRAGLUTIDE (VICTOZA) 18 MG/3ML SOPN VICTOZA 18 MG/3ML SOPN      INJECT 1.2 MG DAILY    INJECT 1.2 MG DAILY   OMEPRAZOLE (PRILOSEC) 20 MG CAPSULE omeprazole (PRILOSEC) 20 MG capsule      Take 1 capsule (20 mg total) by mouth daily.    Take 1 capsule (20 mg total) by mouth daily.    Discontinued Medications   No medications on file    Follow Up:  Return in about 3 months (around 03/03/2017) for DM, BP, chol - come fasting.   Please see AVS handed out to patient at the end of our visit for further patient instructions/ counseling done pertaining to today's office visit.    Note: This document was prepared using Dragon voice recognition software and may include unintentional dictation errors.  DeMellody Dance2:19 PM ----------------------------------------------------------------------------------------------------------------------     Subjective:  HPI: Sophia Cox.o. female  presents for 3 month follow up for multiple medical problems.   HTN: - BP today is the best it has been ever per pt--> usually   Today their BP is BP: (!) 145/82  Last 3 blood pressure readings in our office are as follows: BP Readings from Last 3 Encounters:  12/03/16 (!) 145/82  09/10/16 (!) 146/84  08/13/16 138/85      CHOL:  Last lipid panel as follows:   Lab Results  Component Value Date   CHOL 197 01/31/2016   HDL 31 (L) 01/31/2016   LDLCALC Comment 01/31/2016   LDLDIRECT 91 01/31/2016   TRIG 497 (H) 01/31/2016   CHOLHDL 6.4 (H) 01/31/2016      DM:  Last A1c 6.0 end of Sept- great control.  Pt declines any changes to meds.   Last diabetic eye exam was  Lab Results  Component Value Date   HMDIABEYEEXA No Retinopathy 06/02/2016    Foot exam- UTD Last A1C in the office was:  Lab Results  Component Value Date   HGBA1C 6.3 12/03/2016   HGBA1C 6.0 09/10/2016   HGBA1C 6.4 05/13/2016   Lab Results  Component Value Date   MICROALBUR 0.76 05/16/2014   [place] Comment 01/31/2016   CREATININE 0.68 01/31/2016      Weight:  - wt up from prior-- .   Wt Readings from Last 3 Encounters:  12/03/16 195 lb 1.6 oz (88.5 kg)  09/10/16 190 lb 14.4 oz (86.6 kg)  08/13/16 191 lb 3.2 oz (86.7 kg)   BMI Readings from Last 3 Encounters:  12/03/16 32.47 kg/m  09/10/16 31.77 kg/m  08/13/16 31.82 kg/m     Patient Care Team    Relationship Specialty Notifications Start End  [name], DO PCP - General Family Medicine  09/10/16   Evans Lance, MD Consulting Physician Cardiology  09/10/16   Delrae Rend, MD Consulting Physician Endocrinology  09/10/16      Review of Systems  Constitutional: Negative for malaise/fatigue and weight loss.  Eyes: Negative for blurred vision and double vision.  Respiratory: Negative for shortness of breath and wheezing.   Cardiovascular: Negative for chest pain and palpitations.  Gastrointestinal: Positive for constipation, diarrhea and heartburn. Negative for nausea and vomiting.       Chronic problem  Genitourinary: Negative for dysuria and frequency.  Skin: Positive for itching. Negative for rash.  Neurological: Negative for dizziness, focal weakness, loss of consciousness and headaches.  Endo/Heme/Allergies: Negative for polydipsia. Does not bruise/bleed easily.  Psychiatric/Behavioral: Negative for depression. The patient is not nervous/anxious and does not have insomnia.      Objective:  Physical Exam: BP (!) 145/82   Pulse 84   Ht 5' 5"  (1.651 m)   Wt 195 lb 1.6 oz (88.5 kg)   BMI 32.47 kg/m  Body mass index is 32.47 kg/m. General: Well nourished, in no apparent distress. Eyes: PERRLA, EOMs, conjunctiva clr no swelling or erythema ENT/Mouth: Hearing appears  normal.  Mucus Membranes Moist  Neck: Supple, no masses, No JVD, No bruits Resp: Respiratory effort- normal, ECTA B/L w/o W/R/R  Cardio: RRR w/o MRGs. Abdomen: no gross distention. Lymphatics:  Brisk peripheral pulses, less 2 sec cap RF, no gross edema  M-sk: Full ROM, 5/5 strength, normal gait.  Skin: Warm, dry without rashes, lesions, ecchymosis.  Neuro: Alert, Oriented Psych: Normal affect, Insight and Judgment appropriate.    Current Medications:  Current Outpatient Prescriptions on File Prior to Visit  Medication Sig Dispense Refill  . Blood Glucose Monitoring Suppl (BAYER CONTOUR MONITOR) W/DEVICE KIT 1 each by Does not apply route 2 (two) times daily at 10 AM and 5 PM. 1 kit 0  . cholecalciferol (VITAMIN D) 1000 units tablet Take 1,000 Units by mouth daily.    . cyanocobalamin 500 MCG tablet Take 500 mcg by mouth daily.    Marland Kitchen glucose blood test strip Use to test blood glucose 2 times  daily. Dx: 250.00 100 each 12  . Insulin Pen Needle (BD PEN NEEDLE NANO U/F) 32G X 4 MM MISC Use to inject insulin 2 times daily. diag code E11.42.insulin dependent 190 each 3  . levothyroxine (SYNTHROID, LEVOTHROID) 75 MCG tablet Take 1 tablet (75 mcg total) by mouth daily. 30 tablet 5  . metFORMIN (GLUCOPHAGE-XR) 500 MG 24 hr tablet TAKE TWO TABLETS BY MOUTH ONCE DAILY WITH  BREAKFAST 60 tablet 2  . Turmeric 500 MG CAPS Take by mouth.    . Cetirizine HCl 10 MG CAPS Take 1 capsule (10 mg total) by mouth 1 day or 1 dose. 30 capsule 0   No current facility-administered medications on file prior to visit.     Medical History:  Patient Active Problem List   Diagnosis Date Noted  . Controlled diabetes mellitus with microalbuminuria, without long-term current use of insulin ([place]) 09/10/2016    Priority: High  . Patient's noncompliance with other medical treatment and regimen 09/10/2016    Priority: High  . Hyperlipidemia 02/01/2016    Priority: High  . Obesity 02/01/2016    Priority: High  . HTN  (hypertension) 04/26/2013    Priority: High  . GERD (gastroesophageal reflux disease) 02/01/2016    Priority: Medium  . Hypothyroidism 05/18/2013    Priority: Medium  . Type 2 diabetes mellitus with peripheral neuropathy ([place]) 12/14/2000    Priority: Low  . Allergic rhinitis 08/13/2016  . Esophageal stricture 02/01/2016  . Alternating constipation and diarrhea 05/16/2014  . Preventative health care 12/04/2013  . SVT (supraventricular tachycardia) ([place]) 04/26/2013    Allergies:  Allergies  Allergen Reactions  . Eggs Or Egg-Derived Products Diarrhea  . Penicillins Other (See Comments)    Childhood reaction - hemorrhaged     Family history-  Reviewed; changed as appropriate  Social history-  Reviewed; changed as appropriate

## 2016-12-03 NOTE — Assessment & Plan Note (Signed)
Declines ARB, Statin and others.   Understands risks and is adamant

## 2016-12-03 NOTE — Patient Instructions (Addendum)
Consider going [employer]   Guidelines for Losing Weight   We want weight loss that will last so you should lose 1-2 pounds a week.  THAT IS IT! Please pick THREE things a month to change. Once it is a habit check off the item. Then pick another three items off the list to become habits.  If you are already doing a habit on the list GREAT!  Cross that item off!  Don't drink your calories. Ie, alcohol, soda, fruit juice, and sweet tea.   Drink more water. Drink a glass when you feel hungry or before each meal.   Eat breakfast - Complex carb and protein (likeDannon light and fit yogurt, oatmeal, fruit, eggs, [place] bacon).  Measure your cereal.  Eat no more than one cup a day. (ie Kashi)  Eat an apple a day.  Add a vegetable a day.  Try a new vegetable a month.  Use Pam! Stop using oil or butter to cook.  Don't finish your plate or use smaller plates.  Share your dessert.  Eat sugar free Jello for dessert or frozen grapes.  Don't eat 2-3 hours before bed.  Switch to whole wheat bread, pasta, and brown rice.  Make healthier choices when you eat out. No fries!  Pick baked chicken, NOT fried.  Don't forget to SLOW DOWN when you eat. It is not going anywhere.   Take the stairs.  Park far away in the parking lot  Lift soup cans (or weights) for 10 minutes while watching TV.  Walk at work for 10 minutes during break.  Walk outside 1 time a week with your friend, kids, dog, or significant other.  Start a walking group at church.  Walk the mall as much as you can tolerate.   Keep a food diary.  Weigh yourself daily.  Walk for 15 minutes 3 days per week.  Cook at home more often and eat out less. If life happens and you go back to old habits, it is okay.  Just start over. You can do it!  If you experience chest pain, get short of breath, or tired during the exercise, please stop immediately and inform your doctor.    Before you even begin to attack a weight-loss plan,  it pays to remember this: You are not fat. You have fat. Losing weight isn't about blame or shame; it's simply another achievement to accomplish. Dieting is like any other skill-you have to buckle down and work at it. As long as you act in a smart, reasonable way, you'll ultimately get where you want to be. Here are some weight loss pearls for you.   1. It's Not a Diet. It's a Lifestyle Thinking of a diet as something you're on and suffering through only for the short term doesn't work. To shed weight and keep it off, you need to make permanent changes to the way you eat. It's OK to indulge occasionally, of course, but if you cut calories temporarily and then revert to your old way of eating, you'll gain back the weight quicker than you can say yo-yo. Use it to lose it. Research shows that one of the best predictors of long-term weight loss is how many pounds you drop in the first month. For that reason, nutritionists often suggest being stricter for the first two weeks of your new eating strategy to build momentum. Cut out added sugar and alcohol and avoid unrefined carbs. After that, figure out how you can reincorporate them in  a way that's healthy and maintainable.  2. There's a Right Way to Exercise Working out burns calories and fat and boosts your metabolism by building muscle. But those trying to lose weight are notorious for overestimating the number of calories they burn and underestimating the amount they take in. Unfortunately, your system is biologically programmed to hold on to extra pounds and that means when you start exercising, your body senses the deficit and ramps up its hunger signals. If you're not diligent, you'll eat everything you burn and then some. Use it, to lose it. Cardio gets all the exercise glory, but strength and interval training are the real heroes. They help you build lean muscle, which in turn increases your metabolism and calorie-burning ability 3. Don't Overreact to  Mild Hunger Some people have a hard time losing weight because of hunger anxiety. To them, being hungry is bad-something to be avoided at all costs-so they carry snacks with them and eat when they don't need to. Others eat because they're stressed out or bored. While you never want to get to the point of being ravenous (that's when bingeing is likely to happen), a hunger pang, a craving, or the fact that it's 3:00 p.m. should not send you racing for the vending machine or obsessing about the energy bar in your purse. Ideally, you should put off eating until your stomach is growling and it's difficult to concentrate.  Use it to lose it. When you feel the urge to eat, use the HALT method. Ask yourself, Am I really hungry? Or am I angry or anxious, lonely or bored, or tired? If you're still not certain, try the apple test. If you're truly hungry, an apple should seem delicious; if it doesn't, something else is going on. Or you can try drinking water and making yourself busy, if you are still hungry try a healthy snack.  4. Not All Calories Are Created Equal The mechanics of weight loss are pretty simple: Take in fewer calories than you use for energy. But the kind of food you eat makes all the difference. Processed food that's high in saturated fat and refined starch or sugar can cause inflammation that disrupts the hormone signals that tell your brain you're full. The result: You eat a lot more.  Use it to lose it. Clean up your diet. Swap in whole, unprocessed foods, including vegetables, lean protein, and healthy fats that will fill you up and give you the biggest nutritional bang for your calorie buck. In a few weeks, as your brain starts receiving regular hunger and fullness signals once again, you'll notice that you feel less hungry overall and naturally start cutting back on the amount you eat.  5. Protein, Produce, and Plant-Based Fats Are Your Weight-Loss Trinity Here's why eating the three Ps regularly  will help you drop pounds. Protein fills you up. You need it to build lean muscle, which keeps your metabolism humming so that you can torch more fat. People in a weight-loss program who ate double the recommended daily allowance for protein (about 110 grams for a 150-pound woman) lost 70 percent of their weight from fat, while people who ate the RDA lost only about 40 percent, one study found. Produce is packed with filling fiber. "It's very difficult to consume too many calories if you're eating a lot of vegetables. Example: Three cups of broccoli is a lot of food, yet only 93 calories. (Fruit is another story. It can be easy to overeat and can  contain a lot of calories from sugar, so be sure to monitor your intake.) Plant-based fats like olive oil and those in avocados and nuts are healthy and extra satiating.  Use it to lose it. Aim to incorporate each of the three Ps into every meal and snack. People who eat protein throughout the day are able to keep weight off, according to a study in the [place] of Clinical Nutrition. In addition to meat, poultry and seafood, good sources are beans, lentils, eggs, tofu, and yogurt. As for fat, keep portion sizes in check by measuring out salad dressing, oil, and nut butters (shoot for one to two tablespoons). Finally, eat veggies or a little fruit at every meal. People who did that consumed 308 fewer calories but didn't feel any hungrier than when they didn't eat more produce.  7. How You Eat Is As Important As What You Eat In order for your brain to register that you're full, you need to focus on what you're eating. Sit down whenever you eat, preferably at a table. Turn off the TV or computer, put down your phone, and look at your food. Smell it. Chew slowly, and don't put another bite on your fork until you swallow. When women ate lunch this attentively, they consumed 30 percent less when snacking later than those who listened to an audiobook at lunchtime,  according to a study in the [place] of Nutrition. 8. Weighing Yourself Really Works The scale provides the best evidence about whether your efforts are paying off. Seeing the numbers tick up or down or stagnate is motivation to keep going-or to rethink your approach. A 2015 study at [facility] found that daily weigh-ins helped people lose more weight, keep it off, and maintain that loss, even after two years. Use it to lose it. Step on the scale at the same time every day for the best results. If your weight shoots up several pounds from one weigh-in to the next, don't freak out. Eating a lot of salt the night before or having your period is the likely culprit. The number should return to normal in a day or two. It's a steady climb that you need to do something about. 9. Too Much Stress and Too Little Sleep Are Your Enemies When you're tired and frazzled, your body cranks up the production of cortisol, the stress hormone that can cause carb cravings. Not getting enough sleep also boosts your levels of ghrelin, a hormone associated with hunger, while suppressing leptin, a hormone that signals fullness and satiety. People on a diet who slept only five and a half hours a night for two weeks lost 55 percent less fat and were hungrier than those who slept eight and a half hours, according to a study in the [place]. Use it to lose it. Prioritize sleep, aiming for seven hours or more a night, which research shows helps lower stress. And make sure you're getting quality zzz's. If a snoring spouse or a fidgety cat wakes you up frequently throughout the night, you may end up getting the equivalent of just four hours of sleep, according to a study from [facility]. Keep pets out of the bedroom, and use a white-noise app to drown out snoring. 10. You Will Hit a plateau-And You Can Bust Through It As you slim down, your body releases much less leptin, the fullness  hormone.  If you're not strength training, start right now. Building muscle can raise your metabolism  to help you overcome a plateau. To keep your body challenged and burning calories, incorporate new moves and more intense intervals into your workouts or add another sweat session to your weekly routine. Alternatively, cut an extra 100 calories or so a day from your diet. Now that you've lost weight, your body simply doesn't need as much fuel.      Since food equals calories, in order to lose weight you must either eat fewer calories, exercise more to burn off calories with activity, or both. Food that is not used to fuel the body is stored as fat. A major component of losing weight is to make smarter food choices. Here's how:  1)   Limit non-nutritious foods, such as: Sugar, honey, syrups and candy Pastries, donuts, pies, cakes and cookies Soft drinks, sweetened juices and alcoholic beverages  2)  Cut down on high-fat foods by: - Choosing poultry, fish or lean red meat - Choosing low-fat cooking methods, such as baking, broiling, steaming, grilling and boiling - Using low-fat or non-fat dairy products - Using vinaigrette, herbs, lemon or fat-free salad dressings - Avoiding fatty meats, such as bacon, sausage, franks, ribs and luncheon meats - Avoiding high-fat snacks like nuts, chips and chocolate - Avoiding fried foods - Using less butter, margarine, oil and mayonnaise - Avoiding high-fat gravies, cream sauces and cream-based soups  3) Eat a variety of foods, including: - Fruit and vegetables that are raw, steamed or baked - Whole grains, breads, cereal, rice and pasta - Dairy products, such as low-fat or non-fat milk or yogurt, low-fat cottage cheese and low-fat cheese - Protein-rich foods like chicken, [place], fish, lean meat and legumes, or beans  4) Change your eating habits by: - Eat three balanced meals a day to help control your hunger - Watch portion sizes and eat small  servings of a variety of foods - Choose low-calorie snacks - Eat only when you are hungry and stop when you are satisfied - Eat slowly and try not to perform other tasks while eating - Find other activities to distract you from food, such as walking, taking up a hobby or being involved in the community - Include regular exercise in your daily routine ( minimum of 20 min of moderate-intensity exercise at least 5 days/week)  - Find a support group, if necessary, for emotional support in your weight loss journey      Easy ways to cut 100 calories  1. Eat your eggs with hot sauce OR salsa instead of cheese.  Eggs are great for breakfast, but many people consider eggs and cheese to be BFFs. Instead of cheese-1 oz. of cheddar has 114 calories-top your eggs with hot sauce, which contains no calories and helps with satiety and metabolism. Salsa is also a great option!!  2. Top your toast, waffles or pancakes with fresh berries instead of jelly or syrup. Half a cup of berries-fresh, frozen or thawed-has about 40 calories, compared with 2 tbsp. of maple syrup or jelly, which both have about 100 calories. The berries will also give you a good punch of fiber, which helps keep you full and satisfied and won't spike blood sugar quickly like the jelly or syrup. 3. Swap the non-fat latte for black coffee with a splash of half-and-half. Contrary to its name, that non-fat latte has 130 calories and a startling 19g of carbohydrates per 16 oz. serving. Replacing that 'light' drinkable dessert with a black coffee with a splash of half-and-half saves you more than  100 calories per 16 oz. serving. 4. Sprinkle salads with freeze-dried raspberries instead of dried cranberries. If you want a sweet addition to your nutritious salad, stay away from dried cranberries. They have a whopping 130 calories per  cup and 30g carbohydrates. Instead, sprinkle freeze-dried raspberries guilt-free and save more than 100 calories per   cup serving, adding 3g of belly-filling fiber. 5. Go for mustard in place of mayo on your sandwich. Mustard can add really nice flavor to any sandwich, and there are tons of varieties, from spicy to honey. A serving of mayo is 95 calories, versus 10 calories in a serving of mustard.  Or try an avocado mayo spread: You can find the recipe few click this link: https://www.californiaavocado.com/recipes/recipe-container/[place]-avocado-mayo 6. Choose a DIY salad dressing instead of the store-bought kind. Mix Dijon or whole grain mustard with low-fat Kefir or red wine vinegar and garlic. 7. Use hummus as a spread instead of a dip. Use hummus as a spread on a high-fiber cracker or tortilla with a sandwich and save on calories without sacrificing taste. 8. Pick just one salad "accessory." Salad isn't automatically a calorie winner. It's easy to over-accessorize with toppings. Instead of topping your salad with nuts, avocado and cranberries (all three will clock in at 313 calories), just pick one. The next day, choose a different accessory, which will also keep your salad interesting. You don't wear all your jewelry every day, right? 9. Ditch the white pasta in favor of spaghetti squash. One cup of cooked spaghetti squash has about 40 calories, compared with traditional spaghetti, which comes with more than 200. Spaghetti squash is also nutrient-dense. It's a good source of fiber and Vitamins A and C, and it can be eaten just like you would eat pasta-with a great tomato sauce and [place] meatballs or with pesto, tofu and spinach, for example. 10. Dress up your chili, soups and stews with non-fat [place] yogurt instead of sour cream. Just a 'dollop' of sour cream can set you back 115 calories and a whopping 12g of fat-seven of which are of the artery-clogging variety. Added bonus: [place] yogurt is packed with muscle-building protein, calcium and B Vitamins. 11. Mash cauliflower instead of mashed potatoes. One  cup of traditional mashed potatoes-in all their creamy goodness-has more than 200 calories, compared to mashed cauliflower, which you can typically eat for less than 100 calories per 1 cup serving. Cauliflower is a great source of the antioxidant indole-3-carbinol (I3C), which may help reduce the risk of some cancers, like breast cancer. 12. Ditch the ice cream sundae in favor of a [place] yogurt parfait. Instead of a cup of ice cream or fro-yo for dessert, try 1 cup of nonfat Greek yogurt topped with fresh berries and a sprinkle of cacao nibs. Both toppings are packed with antioxidants, which can help reduce cellular inflammation and oxidative damage. And the comparison is a no-brainer: One cup of ice cream has about 275 calories; one cup of frozen yogurt has about 230; and a cup of Greek yogurt has just 130, plus twice the protein, so you're less likely to return to the freezer for a second helping. 13. Put olive oil in a spray container instead of using it directly from the bottle. Each tablespoon of olive oil is 120 calories and 15g of fat. Use a mister instead of pouring it straight into the pan or onto a salad. This allows for portion control and will save you more than 100 calories. 14. When baking, substitute canned pumpkin  for butter or oil. Canned pumpkin-not pumpkin pie mix-is loaded with Vitamin A, which is important for skin and eye health, as well as immunity. And the comparisons are pretty crazy:  cup of canned pumpkin has about 40 calories, compared to butter or oil, which has more than 800 calories. Yes, 800 calories. Applesauce and mashed banana can also serve as good substitutions for butter or oil, usually in a 1:1 ratio. 15. Top casseroles with high-fiber cereal instead of breadcrumbs. Breadcrumbs are typically made with white bread, while breakfast cereals contain 5-9g of fiber per serving. Not only will you save more than 150 calories per  cup serving, the swap will also keep you more  full and you'll get a metabolism boost from the added fiber. 16. Snack on pistachios instead of macadamia nuts. Believe it or not, you get the same amount of calories from 35 pistachios (100 calories) as you would from only five macadamia nuts. 17. Chow down on kale chips rather than potato chips. This is my favorite 'don't knock it 'till you try it' swap. Kale chips are so easy to make at home, and you can spice them up with a little grated parmesan or chili powder. Plus, they're a mere fraction of the calories of potato chips, but with the same crunch factor we crave so often. 18. Add seltzer and some fruit slices to your cocktail instead of soda or fruit juice. One cup of soda or fruit juice can pack on as much as 140 calories. Instead, use seltzer and fruit slices. The fruit provides valuable phytochemicals, such as flavonoids and anthocyanins, which help to combat cancer and stave off the aging process.

## 2016-12-08 ENCOUNTER — Other Ambulatory Visit: Payer: Self-pay | Admitting: Internal Medicine

## 2016-12-15 ENCOUNTER — Other Ambulatory Visit: Payer: Self-pay | Admitting: Family Medicine

## 2016-12-15 ENCOUNTER — Other Ambulatory Visit: Payer: Self-pay

## 2016-12-15 DIAGNOSIS — K219 Gastro-esophageal reflux disease without esophagitis: Secondary | ICD-10-CM

## 2016-12-15 DIAGNOSIS — E1142 Type 2 diabetes mellitus with diabetic polyneuropathy: Secondary | ICD-10-CM

## 2016-12-15 NOTE — Addendum Note (Signed)
Addended by: Fonnie Mu on: 12/15/2016 04:41 PM   Modules accepted: Orders

## 2016-12-15 NOTE — Telephone Encounter (Signed)
Pt states that she does not see Dr. Buddy Duty, endocrinologist, for diabetes, only thyroid.  Pt requests refill on Victoza, for which we have not prescribed yet.  Please approve if appropriate. Charyl Bigger, CMA

## 2016-12-15 NOTE — Telephone Encounter (Signed)
Patient is requesting a refill of her victoza and omeprazole, she needs it sent to [facility] on [address] per her new insurance guidelines

## 2016-12-15 NOTE — Telephone Encounter (Signed)
We have not prescribed these medications for this patient.  Please approved if appropriate.  Charyl Bigger, CMA

## 2016-12-16 ENCOUNTER — Telehealth: Payer: Self-pay | Admitting: Family Medicine

## 2016-12-16 MED ORDER — OMEPRAZOLE 20 MG PO CPDR: 20.0000 mg | DELAYED_RELEASE_CAPSULE | Freq: Every day | ORAL | 2 refills | Status: DC

## 2016-12-16 MED ORDER — LIRAGLUTIDE 18 MG/3ML ~~LOC~~ SOPN: PEN_INJECTOR | SUBCUTANEOUS | 2 refills | Status: DC

## 2016-12-16 NOTE — Telephone Encounter (Signed)
LVM informing pt that RX was sent to pharmacy and confirmed received at 3:25pm.  Charyl Bigger, CMA

## 2016-12-16 NOTE — Telephone Encounter (Signed)
APproved 90d, please confirm dose she takes.   please ensure pt comes ibn q 50mo for labs/ A1c etc and regular f/up

## 2016-12-16 NOTE — Telephone Encounter (Signed)
Pt states that she is taking 1.31ml daily.  Pt has f/u appointment on 03/07/17.  Charyl Bigger, CMA

## 2016-12-16 NOTE — Telephone Encounter (Signed)
Pt called states Pharmacy did not receive Rx for Omeprazola-glh

## 2017-01-08 DIAGNOSIS — Z961 Presence of intraocular lens: Secondary | ICD-10-CM | POA: Diagnosis not present

## 2017-01-08 DIAGNOSIS — H40023 Open angle with borderline findings, high risk, bilateral: Secondary | ICD-10-CM | POA: Diagnosis not present

## 2017-01-08 DIAGNOSIS — H43813 Vitreous degeneration, bilateral: Secondary | ICD-10-CM | POA: Diagnosis not present

## 2017-01-08 DIAGNOSIS — H10413 Chronic giant papillary conjunctivitis, bilateral: Secondary | ICD-10-CM | POA: Diagnosis not present

## 2017-01-08 DIAGNOSIS — H353133 Nonexudative age-related macular degeneration, bilateral, advanced atrophic without subfoveal involvement: Secondary | ICD-10-CM | POA: Diagnosis not present

## 2017-01-08 DIAGNOSIS — H5713 Ocular pain, bilateral: Secondary | ICD-10-CM | POA: Diagnosis not present

## 2017-02-10 DIAGNOSIS — H353133 Nonexudative age-related macular degeneration, bilateral, advanced atrophic without subfoveal involvement: Secondary | ICD-10-CM | POA: Diagnosis not present

## 2017-02-10 DIAGNOSIS — H26491 Other secondary cataract, right eye: Secondary | ICD-10-CM | POA: Diagnosis not present

## 2017-02-10 DIAGNOSIS — H10413 Chronic giant papillary conjunctivitis, bilateral: Secondary | ICD-10-CM | POA: Diagnosis not present

## 2017-02-10 DIAGNOSIS — H40023 Open angle with borderline findings, high risk, bilateral: Secondary | ICD-10-CM | POA: Diagnosis not present

## 2017-02-10 DIAGNOSIS — Z961 Presence of intraocular lens: Secondary | ICD-10-CM | POA: Diagnosis not present

## 2017-02-10 DIAGNOSIS — H43813 Vitreous degeneration, bilateral: Secondary | ICD-10-CM | POA: Diagnosis not present

## 2017-02-18 DIAGNOSIS — E049 Nontoxic goiter, unspecified: Secondary | ICD-10-CM | POA: Diagnosis not present

## 2017-02-18 DIAGNOSIS — E039 Hypothyroidism, unspecified: Secondary | ICD-10-CM | POA: Diagnosis not present

## 2017-02-18 DIAGNOSIS — R0683 Snoring: Secondary | ICD-10-CM | POA: Diagnosis not present

## 2017-02-18 DIAGNOSIS — R5383 Other fatigue: Secondary | ICD-10-CM | POA: Diagnosis not present

## 2017-02-18 DIAGNOSIS — G4700 Insomnia, unspecified: Secondary | ICD-10-CM | POA: Diagnosis not present

## 2017-03-04 ENCOUNTER — Ambulatory Visit: Payer: BLUE CROSS/BLUE SHIELD | Admitting: Family Medicine

## 2017-03-08 ENCOUNTER — Other Ambulatory Visit: Payer: Self-pay

## 2017-03-08 DIAGNOSIS — E1142 Type 2 diabetes mellitus with diabetic polyneuropathy: Secondary | ICD-10-CM

## 2017-03-08 MED ORDER — CETIRIZINE HCL 10 MG PO CAPS: 10.0000 mg | ORAL_CAPSULE | Freq: Every day | ORAL | 0 refills | Status: DC

## 2017-03-08 MED ORDER — METFORMIN HCL ER 500 MG PO TB24: ORAL_TABLET | ORAL | 0 refills | Status: DC

## 2017-03-08 NOTE — Telephone Encounter (Signed)
Pt came into the office requesting refills of cetirizine and metformin.  Pt was to have returned 3/18 for recheck, but is not scheduled until 04/14/17.  Advised pt that she must have OV in the next 30 days before any further refills, per Lillard Anes, NP.  Charyl Bigger, CMA

## 2017-03-21 ENCOUNTER — Other Ambulatory Visit: Payer: Self-pay | Admitting: Family Medicine

## 2017-03-21 DIAGNOSIS — K219 Gastro-esophageal reflux disease without esophagitis: Secondary | ICD-10-CM

## 2017-03-21 DIAGNOSIS — E1142 Type 2 diabetes mellitus with diabetic polyneuropathy: Secondary | ICD-10-CM

## 2017-04-01 ENCOUNTER — Ambulatory Visit: Payer: BLUE CROSS/BLUE SHIELD | Admitting: Family Medicine

## 2017-04-05 ENCOUNTER — Other Ambulatory Visit: Payer: Self-pay

## 2017-04-05 DIAGNOSIS — E1142 Type 2 diabetes mellitus with diabetic polyneuropathy: Secondary | ICD-10-CM

## 2017-04-05 MED ORDER — METFORMIN HCL ER 500 MG PO TB24: ORAL_TABLET | ORAL | 0 refills | Status: DC

## 2017-04-05 MED ORDER — CETIRIZINE HCL 10 MG PO CAPS: 10.0000 mg | ORAL_CAPSULE | Freq: Every day | ORAL | 2 refills | Status: DC

## 2017-04-05 NOTE — Telephone Encounter (Signed)
Pt has appt 04/14/17.  Charyl Bigger, CMA

## 2017-04-14 ENCOUNTER — Ambulatory Visit (INDEPENDENT_AMBULATORY_CARE_PROVIDER_SITE_OTHER): Payer: Medicare Other | Admitting: Family Medicine

## 2017-04-14 ENCOUNTER — Encounter: Payer: Self-pay | Admitting: Family Medicine

## 2017-04-14 VITALS — BP 136/82 | HR 80 | Ht 65.0 in | Wt 201.5 lb

## 2017-04-14 DIAGNOSIS — E669 Obesity, unspecified: Secondary | ICD-10-CM | POA: Diagnosis not present

## 2017-04-14 DIAGNOSIS — E785 Hyperlipidemia, unspecified: Secondary | ICD-10-CM

## 2017-04-14 DIAGNOSIS — E1129 Type 2 diabetes mellitus with other diabetic kidney complication: Secondary | ICD-10-CM | POA: Diagnosis not present

## 2017-04-14 DIAGNOSIS — E781 Pure hyperglyceridemia: Secondary | ICD-10-CM

## 2017-04-14 DIAGNOSIS — K219 Gastro-esophageal reflux disease without esophagitis: Secondary | ICD-10-CM

## 2017-04-14 DIAGNOSIS — E1169 Type 2 diabetes mellitus with other specified complication: Secondary | ICD-10-CM | POA: Insufficient documentation

## 2017-04-14 DIAGNOSIS — E782 Mixed hyperlipidemia: Secondary | ICD-10-CM

## 2017-04-14 DIAGNOSIS — Z91199 Patient's noncompliance with other medical treatment and regimen due to unspecified reason: Secondary | ICD-10-CM

## 2017-04-14 DIAGNOSIS — Z9119 Patient's noncompliance with other medical treatment and regimen: Secondary | ICD-10-CM | POA: Diagnosis not present

## 2017-04-14 DIAGNOSIS — R809 Proteinuria, unspecified: Secondary | ICD-10-CM

## 2017-04-14 DIAGNOSIS — I150 Renovascular hypertension: Secondary | ICD-10-CM

## 2017-04-14 DIAGNOSIS — E038 Other specified hypothyroidism: Secondary | ICD-10-CM

## 2017-04-14 DIAGNOSIS — Z23 Encounter for immunization: Secondary | ICD-10-CM | POA: Diagnosis not present

## 2017-04-14 DIAGNOSIS — E1142 Type 2 diabetes mellitus with diabetic polyneuropathy: Secondary | ICD-10-CM

## 2017-04-14 LAB — POCT GLYCOSYLATED HEMOGLOBIN (HGB A1C): Hemoglobin A1C: 6.6

## 2017-04-14 LAB — POCT UA - MICROALBUMIN
Creatinine, POC: 200 mg/dL
MICROALBUMIN (UR) POC: 150 mg/L

## 2017-04-14 MED ORDER — METFORMIN HCL ER 500 MG PO TB24: ORAL_TABLET | ORAL | 4 refills | Status: DC

## 2017-04-14 MED ORDER — OMEPRAZOLE 20 MG PO CPDR: 20.0000 mg | DELAYED_RELEASE_CAPSULE | Freq: Every day | ORAL | 4 refills | Status: DC

## 2017-04-14 MED ORDER — GLUCOSE BLOOD VI STRP: ORAL_STRIP | 12 refills | Status: DC

## 2017-04-14 MED ORDER — LIRAGLUTIDE 18 MG/3ML ~~LOC~~ SOPN: 1.2000 mg | PEN_INJECTOR | Freq: Every day | SUBCUTANEOUS | 3 refills | Status: DC

## 2017-04-14 NOTE — Assessment & Plan Note (Signed)
Declines arb  - check microalb:crt ratio

## 2017-04-14 NOTE — Progress Notes (Signed)
Assessment and Plan:  Controlled diabetes mellitus with microalbuminuria, without long-term current use of insulin (HCC) Declines arb - check microalb:crt ratio---> elevated today!!!   - Continue current treatment regimen--> A1c well controlled and under 7.0.   - Counseling diet and exercise.  - Check A1C q 6 mo since well controlled - eye & foot exams and urine micro albumin yearly    Proteinuria 30-300 when checked. - Counseling done regarding disease process and various treatment options, explained importance of WELL controlled BP and BS etc.    Refuses ARB or other BP med. - All questions answered - Handouts given if patient desired them    Obesity Counseling done- advised wt loss.  If interested in actively managing weight:  Use lose it or my fitness pal---> track all food and drinks.   Discussed with patient importance of weight loss to help achieve health goals and how increasing weight, correlates to increasing risk of various diseases. (or increasing risk of not controlling existing diseases.)  F/up sooner than planned if you would like to further discuss strategies to lose weight  - Weight watchers recommended;   Declines nutrition counseling with dietary specialist   Hyperlipidemia - Will check lipid panel - refused statins/ any chol meds in the past. - await results- txmnt options pending results    Patient's noncompliance with other medical treatment and regimen- declines ARB or chol meds Risks associated with noncompliant behavior with my treatment regimen or treatment plan reviewed with patient.   Pt denies depression/ mood disorder, denies significant barriers of education, money etc.  Explained many significant risks associated with these disease processes that if they remain poorly controlled, can even lead to death.  Advised pt that if she/he is going to continue to NOT follow my advice/ direction, it is best they stop coming to our clinic  as to not waste my time or their money.     Hypertriglyceridemia Low carb diet d/c pt   -Elevated BP:  - wt loss; declines an ARB Importance of ambulatory blood pressure monitoring d/c pt- bring in log next OV DASH diet.  Routine exercise   - Cholesterol:  Refuses to take meds for it Counseling on diet and exercise.  Check cholesterol 6-12 mo     -Reminded patient the need for yearly complete physical exam office visits in addition to office visits for management of the chronic diseases  Orders Placed This Encounter  Procedures  . DG Bone Density  . Tdap vaccine greater than or equal to 7yo IM  . Lipid panel  . CBC with Differential/Platelet  . Comprehensive metabolic panel  . POCT UA - Microalbumin  . POCT glycosylated hemoglobin (Hb A1C)      New Prescriptions   No medications on file    Modified Medications   Modified Medication Previous Medication   GLUCOSE BLOOD TEST STRIP glucose blood test strip      Use to test blood glucose 2 times daily. Dx: 250.00    Use to test blood glucose 2 times daily. Dx: 250.00   LIRAGLUTIDE (VICTOZA) 18 MG/3ML SOPN VICTOZA 18 MG/3ML SOPN      Inject 0.2 mLs (1.2 mg total) into the skin daily.    INJECT 1.2 MG DAILY   METFORMIN (GLUCOPHAGE-XR) 500 MG 24 HR TABLET metFORMIN (GLUCOPHAGE-XR) 500 MG 24 hr tablet      TAKE TWO TABLETS BY MOUTH ONCE DAILY WITH  BREAKFAST    TAKE TWO TABLETS BY  MOUTH ONCE DAILY WITH  BREAKFAST   OMEPRAZOLE (PRILOSEC) 20 MG CAPSULE omeprazole (PRILOSEC) 20 MG capsule      Take 1 capsule (20 mg total) by mouth daily.    TAKE 1 CAPSULE (20 MG TOTAL) BY MOUTH DAILY.    Discontinued Medications   LEVOTHYROXINE (SYNTHROID, LEVOTHROID) 75 MCG TABLET    Take 1 tablet (75 mcg total) by mouth daily.     Follow Up:  Return for for chronic care- DM, BP monitoring, wt etc in 4-58mo   Please see AVS handed out to patient at the end of our visit for further patient instructions/ counseling done pertaining to  today's office visit.    Note: This document was prepared using Dragon voice recognition software and may include unintentional dictation errors.  DMellody Dance12:24 PM ----------------------------------------------------------------------------------------------------------------------     Subjective:  HPI: Sophia Cox.o. female  presents for 3 month follow up for multiple medical problems.   BP: Due to her DM-- BP's have historically been higher than goal.  Denies Sx, refuses to take meds despite me requesting she take an ARB. DID not bring in BP log as requested in past.  Today their BP is BP: 136/82  Last 3 blood pressure readings in our office are as follows: BP Readings from Last 3 Encounters:  04/14/17 136/82  12/03/16 (!) 145/82  09/10/16 (!) 146/84      CHOL:  NOT taking meds- pt refuses and long h/o non-compliance as pt doesn't like to take meds.  No prudent diet Last lipid panel as follows:  Lab Results  Component Value Date   CHOL 226 (H) 04/14/2017   HDL 38 (L) 04/14/2017   LDLCALC 124 (H) 04/14/2017   LDLDIRECT 91 01/31/2016   TRIG 321 (H) 04/14/2017   CHOLHDL 5.9 (H) 04/14/2017      DM:  Not checking BS at all but no SX.  Well controlled in general.  Doesn't have test strips to check- expired from 2010.  Denies sx of highs or lows.  No new sx or complaints.  Feels fine.  Last diabetic eye exam was  Lab Results  Component Value Date   HMDIABEYEEXA No Retinopathy 06/02/2016   Foot exam- UTD  Last A1C in the office was:  Lab Results  Component Value Date   HGBA1C 6.6 04/14/2017   HGBA1C 6.3 12/03/2016   HGBA1C 6.0 09/10/2016   Lab Results  Component Value Date   MICROALBUR 150 04/14/2017   LDLCALC 124 (H) 04/14/2017   CREATININE 0.75 04/14/2017      Weight:  wt gained since last ov  361m on treadmill 5d/wk since Jan.  No prudent diet- doesn't eat healthy per pt.   Wt Readings from Last 3 Encounters:  04/14/17  201 lb 8 oz (91.4 kg)  12/03/16 195 lb 1.6 oz (88.5 kg)  09/10/16 190 lb 14.4 oz (86.6 kg)   BMI Readings from Last 3 Encounters:  04/14/17 33.53 kg/m  12/03/16 32.47 kg/m  09/10/16 31.77 kg/m     Patient Care Team    Relationship Specialty Notifications Start End  OpMellody DanceDO PCP - General Family Medicine  09/10/16   TaEvans LanceMD Consulting Physician Cardiology  09/10/16   KeDelrae RendMD Consulting Physician Endocrinology  09/10/16   DiCalvert CantorMD Consulting Physician Ophthalmology  04/14/17   RaHurman HornMD Consulting Physician Ophthalmology  04/14/17    Comment: retinal specialist     Review of Systems: General:   Denies fever,  chills, unexplained weight loss.  Optho/Auditory:   Denies visual changes, blurred vision/LOV Respiratory:   Denies SOB, DOE more than baseline levels.  Cardiovascular:   Denies chest pain, palpitations, new onset peripheral edema  Gastrointestinal:   Denies nausea, vomiting, diarrhea.  Genitourinary: Denies dysuria, freq/ urgency, flank pain or discharge from genitals.  Endocrine:     Denies hot or cold intolerance, polyuria, polydipsia. Musculoskeletal:   Denies unexplained myalgias, joint swelling, unexplained arthralgias, gait problems.  Skin:  Denies rash, suspicious lesions Neurological:     Denies dizziness, unexplained weakness, numbness  Psychiatric/Behavioral:   Denies mood changes, suicidal or homicidal ideations, hallucinations   Objective: Physical Exam: BP 136/82   Pulse 80   Ht _0  (1.651 m)   Wt 201 lb 8 oz (91.4 kg)   BMI 33.53 kg/m  Body mass index is 33.53 kg/m. General: Well nourished, in no apparent distress. Eyes: PERRLA, EOMs, conjunctiva clr no swelling or erythema ENT/Mouth: Hearing appears normal.  Mucus Membranes Moist  Neck: Supple, no masses Resp: Respiratory effort- normal, ECTA B/L w/o W/R/R  Cardio: RRR w/o MRGs. Abdomen: no gross distention. Lymphatics:  Brisk peripheral pulses,  less 2 sec cap RF, no gross edema  Skin: Warm, dry without rashes, lesions, ecchymosis.  Neuro: Alert, Oriented Psych: Normal affect, Insight and Judgment appropriate.    Current Medications:  Current Outpatient Prescriptions on File Prior to Visit  Medication Sig Dispense Refill  . Blood Glucose Monitoring Suppl (BAYER CONTOUR MONITOR) W/DEVICE KIT 1 each by Does not apply route 2 (two) times daily at 10 AM and 5 PM. 1 kit 0  . Cetirizine HCl 10 MG CAPS Take 1 capsule (10 mg total) by mouth daily. 30 capsule 2  . cholecalciferol (VITAMIN D) 1000 units tablet Take 1,000 Units by mouth daily.    . cyanocobalamin 500 MCG tablet Take 500 mcg by mouth daily.    . Insulin Pen Needle (BD PEN NEEDLE NANO U/F) 32G X 4 MM MISC Use to inject insulin 2 times daily. diag code E11.42.insulin dependent 190 each 3  . Turmeric 500 MG CAPS Take by mouth.     No current facility-administered medications on file prior to visit.     Medical History:  Patient Active Problem List   Diagnosis Date Noted  . Proteinuria 04/14/2017    Priority: High  . Hypertriglyceridemia 04/14/2017    Priority: High  . Controlled diabetes mellitus with microalbuminuria, without long-term current use of insulin ([place]) 09/10/2016    Priority: High  . Patient's noncompliance with other medical treatment and regimen- declines ARB or chol meds 09/10/2016    Priority: High  . Hyperlipidemia 02/01/2016    Priority: High  . Obesity 02/01/2016    Priority: High  . GERD (gastroesophageal reflux disease) 02/01/2016    Priority: Medium  . Hypothyroidism 05/18/2013    Priority: Medium  . Type 2 diabetes mellitus with peripheral neuropathy ([place]) 12/14/2000    Priority: Low  . Allergic rhinitis 08/13/2016  . Esophageal stricture 02/01/2016  . Alternating constipation and diarrhea 05/16/2014  . Preventative health care 12/04/2013  . SVT (supraventricular tachycardia) ([place]) 04/26/2013    Allergies:  Allergies  Allergen  Reactions  . Eggs Or Egg-Derived Products Diarrhea  . Penicillins Other (See Comments)    Childhood reaction - hemorrhaged     Family history-  Reviewed; changed as appropriate  Social history-  Reviewed; changed as appropriate

## 2017-04-14 NOTE — Patient Instructions (Signed)
Lose it app--track everything you eat.  f/up q 2-4 wks with me if desired to review diet/ food intake.      Guidelines for Losing Weight   We want weight loss that will last so you should lose 1-2 pounds a week.  THAT IS IT! Please pick THREE things a month to change. Once it is a habit check off the item. Then pick another three items off the list to become habits.  If you are already doing a habit on the list GREAT!  Cross that item off!  Don't drink your calories. Ie, alcohol, soda, fruit juice, and sweet tea.   Drink more water. Drink a glass when you feel hungry or before each meal.   Eat breakfast - Complex carb and protein (likeDannon light and fit yogurt, oatmeal, fruit, eggs, [place] bacon).  Measure your cereal.  Eat no more than one cup a day. (ie Kashi)  Eat an apple a day.  Add a vegetable a day.  Try a new vegetable a month.  Use Pam! Stop using oil or butter to cook.  Don't finish your plate or use smaller plates.  Share your dessert.  Eat sugar free Jello for dessert or frozen grapes.  Don't eat 2-3 hours before bed.  Switch to whole wheat bread, pasta, and brown rice.  Make healthier choices when you eat out. No fries!  Pick baked chicken, NOT fried.  Don't forget to SLOW DOWN when you eat. It is not going anywhere.   Take the stairs.  Park far away in the parking lot  Lift soup cans (or weights) for 10 minutes while watching TV.  Walk at work for 10 minutes during break.  Walk outside 1 time a week with your friend, kids, dog, or significant other.  Start a walking group at church.  Walk the mall as much as you can tolerate.   Keep a food diary.  Weigh yourself daily.  Walk for 15 minutes 3 days per week.  Cook at home more often and eat out less. If life happens and you go back to old habits, it is okay.  Just start over. You can do it!  If you experience chest pain, get short of breath, or tired during the exercise, please stop  immediately and inform your doctor.    Before you even begin to attack a weight-loss plan, it pays to remember this: You are not fat. You have fat. Losing weight isn't about blame or shame; it's simply another achievement to accomplish. Dieting is like any other skill-you have to buckle down and work at it. As long as you act in a smart, reasonable way, you'll ultimately get where you want to be. Here are some weight loss pearls for you.   1. It's Not a Diet. It's a Lifestyle Thinking of a diet as something you're on and suffering through only for the short term doesn't work. To shed weight and keep it off, you need to make permanent changes to the way you eat. It's OK to indulge occasionally, of course, but if you cut calories temporarily and then revert to your old way of eating, you'll gain back the weight quicker than you can say yo-yo. Use it to lose it. Research shows that one of the best predictors of long-term weight loss is how many pounds you drop in the first month. For that reason, nutritionists often suggest being stricter for the first two weeks of your new eating strategy to build momentum.  Cut out added sugar and alcohol and avoid unrefined carbs. After that, figure out how you can reincorporate them in a way that's healthy and maintainable.  2. There's a Right Way to Exercise Working out burns calories and fat and boosts your metabolism by building muscle. But those trying to lose weight are notorious for overestimating the number of calories they burn and underestimating the amount they take in. Unfortunately, your system is biologically programmed to hold on to extra pounds and that means when you start exercising, your body senses the deficit and ramps up its hunger signals. If you're not diligent, you'll eat everything you burn and then some. Use it, to lose it. Cardio gets all the exercise glory, but strength and interval training are the real heroes. They help you build lean muscle,  which in turn increases your metabolism and calorie-burning ability 3. Don't Overreact to Mild Hunger Some people have a hard time losing weight because of hunger anxiety. To them, being hungry is bad-something to be avoided at all costs-so they carry snacks with them and eat when they don't need to. Others eat because they're stressed out or bored. While you never want to get to the point of being ravenous (that's when bingeing is likely to happen), a hunger pang, a craving, or the fact that it's 3:00 p.m. should not send you racing for the vending machine or obsessing about the energy bar in your purse. Ideally, you should put off eating until your stomach is growling and it's difficult to concentrate.  Use it to lose it. When you feel the urge to eat, use the HALT method. Ask yourself, Am I really hungry? Or am I angry or anxious, lonely or bored, or tired? If you're still not certain, try the apple test. If you're truly hungry, an apple should seem delicious; if it doesn't, something else is going on. Or you can try drinking water and making yourself busy, if you are still hungry try a healthy snack.  4. Not All Calories Are Created Equal The mechanics of weight loss are pretty simple: Take in fewer calories than you use for energy. But the kind of food you eat makes all the difference. Processed food that's high in saturated fat and refined starch or sugar can cause inflammation that disrupts the hormone signals that tell your brain you're full. The result: You eat a lot more.  Use it to lose it. Clean up your diet. Swap in whole, unprocessed foods, including vegetables, lean protein, and healthy fats that will fill you up and give you the biggest nutritional bang for your calorie buck. In a few weeks, as your brain starts receiving regular hunger and fullness signals once again, you'll notice that you feel less hungry overall and naturally start cutting back on the amount you eat.  5. Protein, Produce,  and Plant-Based Fats Are Your Weight-Loss Trinity Here's why eating the three Ps regularly will help you drop pounds. Protein fills you up. You need it to build lean muscle, which keeps your metabolism humming so that you can torch more fat. People in a weight-loss program who ate double the recommended daily allowance for protein (about 110 grams for a 150-pound woman) lost 70 percent of their weight from fat, while people who ate the RDA lost only about 40 percent, one study found. Produce is packed with filling fiber. "It's very difficult to consume too many calories if you're eating a lot of vegetables. Example: Three cups of broccoli is  a lot of food, yet only 93 calories. (Fruit is another story. It can be easy to overeat and can contain a lot of calories from sugar, so be sure to monitor your intake.) Plant-based fats like olive oil and those in avocados and nuts are healthy and extra satiating.  Use it to lose it. Aim to incorporate each of the three Ps into every meal and snack. People who eat protein throughout the day are able to keep weight off, according to a study in the [place] of Clinical Nutrition. In addition to meat, poultry and seafood, good sources are beans, lentils, eggs, tofu, and yogurt. As for fat, keep portion sizes in check by measuring out salad dressing, oil, and nut butters (shoot for one to two tablespoons). Finally, eat veggies or a little fruit at every meal. People who did that consumed 308 fewer calories but didn't feel any hungrier than when they didn't eat more produce.  7. How You Eat Is As Important As What You Eat In order for your brain to register that you're full, you need to focus on what you're eating. Sit down whenever you eat, preferably at a table. Turn off the TV or computer, put down your phone, and look at your food. Smell it. Chew slowly, and don't put another bite on your fork until you swallow. When women ate lunch this attentively, they  consumed 30 percent less when snacking later than those who listened to an audiobook at lunchtime, according to a study in the [place] of Nutrition. 8. Weighing Yourself Really Works The scale provides the best evidence about whether your efforts are paying off. Seeing the numbers tick up or down or stagnate is motivation to keep going-or to rethink your approach. A 2015 study at [facility] found that daily weigh-ins helped people lose more weight, keep it off, and maintain that loss, even after two years. Use it to lose it. Step on the scale at the same time every day for the best results. If your weight shoots up several pounds from one weigh-in to the next, don't freak out. Eating a lot of salt the night before or having your period is the likely culprit. The number should return to normal in a day or two. It's a steady climb that you need to do something about. 9. Too Much Stress and Too Little Sleep Are Your Enemies When you're tired and frazzled, your body cranks up the production of cortisol, the stress hormone that can cause carb cravings. Not getting enough sleep also boosts your levels of ghrelin, a hormone associated with hunger, while suppressing leptin, a hormone that signals fullness and satiety. People on a diet who slept only five and a half hours a night for two weeks lost 55 percent less fat and were hungrier than those who slept eight and a half hours, according to a study in the [place]. Use it to lose it. Prioritize sleep, aiming for seven hours or more a night, which research shows helps lower stress. And make sure you're getting quality zzz's. If a snoring spouse or a fidgety cat wakes you up frequently throughout the night, you may end up getting the equivalent of just four hours of sleep, according to a study from [facility]. Keep pets out of the bedroom, and use a white-noise app to drown out snoring. 10. You Will Hit a  plateau-And You Can Bust Through It As you slim down, your body releases much  less leptin, the fullness hormone.  If you're not strength training, start right now. Building muscle can raise your metabolism to help you overcome a plateau. To keep your body challenged and burning calories, incorporate new moves and more intense intervals into your workouts or add another sweat session to your weekly routine. Alternatively, cut an extra 100 calories or so a day from your diet. Now that you've lost weight, your body simply doesn't need as much fuel.      Since food equals calories, in order to lose weight you must either eat fewer calories, exercise more to burn off calories with activity, or both. Food that is not used to fuel the body is stored as fat. A major component of losing weight is to make smarter food choices. Here's how:  1)   Limit non-nutritious foods, such as: Sugar, honey, syrups and candy Pastries, donuts, pies, cakes and cookies Soft drinks, sweetened juices and alcoholic beverages  2)  Cut down on high-fat foods by: - Choosing poultry, fish or lean red meat - Choosing low-fat cooking methods, such as baking, broiling, steaming, grilling and boiling - Using low-fat or non-fat dairy products - Using vinaigrette, herbs, lemon or fat-free salad dressings - Avoiding fatty meats, such as bacon, sausage, franks, ribs and luncheon meats - Avoiding high-fat snacks like nuts, chips and chocolate - Avoiding fried foods - Using less butter, margarine, oil and mayonnaise - Avoiding high-fat gravies, cream sauces and cream-based soups  3) Eat a variety of foods, including: - Fruit and vegetables that are raw, steamed or baked - Whole grains, breads, cereal, rice and pasta - Dairy products, such as low-fat or non-fat milk or yogurt, low-fat cottage cheese and low-fat cheese - Protein-rich foods like chicken, [place], fish, lean meat and legumes, or beans  4) Change your eating habits  by: - Eat three balanced meals a day to help control your hunger - Watch portion sizes and eat small servings of a variety of foods - Choose low-calorie snacks - Eat only when you are hungry and stop when you are satisfied - Eat slowly and try not to perform other tasks while eating - Find other activities to distract you from food, such as walking, taking up a hobby or being involved in the community - Include regular exercise in your daily routine ( minimum of 20 min of moderate-intensity exercise at least 5 days/week)  - Find a support group, if necessary, for emotional support in your weight loss journey         Easy ways to cut 100 calories   1. Eat your eggs with hot sauce OR salsa instead of cheese.  Eggs are great for breakfast, but many people consider eggs and cheese to be BFFs. Instead of cheese-1 oz. of cheddar has 114 calories-top your eggs with hot sauce, which contains no calories and helps with satiety and metabolism. Salsa is also a great option!!  2. Top your toast, waffles or pancakes with fresh berries instead of jelly or syrup. Half a cup of berries-fresh, frozen or thawed-has about 40 calories, compared with 2 tbsp. of maple syrup or jelly, which both have about 100 calories. The berries will also give you a good punch of fiber, which helps keep you full and satisfied and won't spike blood sugar quickly like the jelly or syrup. 3. Swap the non-fat latte for black coffee with a splash of half-and-half. Contrary to its name, that non-fat latte has 130 calories and a startling 19g  of carbohydrates per 16 oz. serving. Replacing that 'light' drinkable dessert with a black coffee with a splash of half-and-half saves you more than 100 calories per 16 oz. serving. 4. Sprinkle salads with freeze-dried raspberries instead of dried cranberries. If you want a sweet addition to your nutritious salad, stay away from dried cranberries. They have a whopping 130 calories per  cup and  30g carbohydrates. Instead, sprinkle freeze-dried raspberries guilt-free and save more than 100 calories per  cup serving, adding 3g of belly-filling fiber. 5. Go for mustard in place of mayo on your sandwich. Mustard can add really nice flavor to any sandwich, and there are tons of varieties, from spicy to honey. A serving of mayo is 95 calories, versus 10 calories in a serving of mustard.  Or try an avocado mayo spread: You can find the recipe few click this link: https://www.californiaavocado.com/recipes/recipe-container/[place]-avocado-mayo 6. Choose a DIY salad dressing instead of the store-bought kind. Mix Dijon or whole grain mustard with low-fat Kefir or red wine vinegar and garlic. 7. Use hummus as a spread instead of a dip. Use hummus as a spread on a high-fiber cracker or tortilla with a sandwich and save on calories without sacrificing taste. 8. Pick just one salad "accessory." Salad isn't automatically a calorie winner. It's easy to over-accessorize with toppings. Instead of topping your salad with nuts, avocado and cranberries (all three will clock in at 313 calories), just pick one. The next day, choose a different accessory, which will also keep your salad interesting. You don't wear all your jewelry every day, right? 9. Ditch the white pasta in favor of spaghetti squash. One cup of cooked spaghetti squash has about 40 calories, compared with traditional spaghetti, which comes with more than 200. Spaghetti squash is also nutrient-dense. It's a good source of fiber and Vitamins A and C, and it can be eaten just like you would eat pasta-with a great tomato sauce and [place] meatballs or with pesto, tofu and spinach, for example. 10. Dress up your chili, soups and stews with non-fat [place] yogurt instead of sour cream. Just a 'dollop' of sour cream can set you back 115 calories and a whopping 12g of fat-seven of which are of the artery-clogging variety. Added bonus: [place] yogurt is  packed with muscle-building protein, calcium and B Vitamins. 11. Mash cauliflower instead of mashed potatoes. One cup of traditional mashed potatoes-in all their creamy goodness-has more than 200 calories, compared to mashed cauliflower, which you can typically eat for less than 100 calories per 1 cup serving. Cauliflower is a great source of the antioxidant indole-3-carbinol (I3C), which may help reduce the risk of some cancers, like breast cancer. 12. Ditch the ice cream sundae in favor of a [place] yogurt parfait. Instead of a cup of ice cream or fro-yo for dessert, try 1 cup of nonfat Greek yogurt topped with fresh berries and a sprinkle of cacao nibs. Both toppings are packed with antioxidants, which can help reduce cellular inflammation and oxidative damage. And the comparison is a no-brainer: One cup of ice cream has about 275 calories; one cup of frozen yogurt has about 230; and a cup of Greek yogurt has just 130, plus twice the protein, so you're less likely to return to the freezer for a second helping. 13. Put olive oil in a spray container instead of using it directly from the bottle. Each tablespoon of olive oil is 120 calories and 15g of fat. Use a mister instead of pouring it straight into the  pan or onto a salad. This allows for portion control and will save you more than 100 calories. 14. When baking, substitute canned pumpkin for butter or oil. Canned pumpkin-not pumpkin pie mix-is loaded with Vitamin A, which is important for skin and eye health, as well as immunity. And the comparisons are pretty crazy:  cup of canned pumpkin has about 40 calories, compared to butter or oil, which has more than 800 calories. Yes, 800 calories. Applesauce and mashed banana can also serve as good substitutions for butter or oil, usually in a 1:1 ratio. 15. Top casseroles with high-fiber cereal instead of breadcrumbs. Breadcrumbs are typically made with white bread, while breakfast cereals contain 5-9g of  fiber per serving. Not only will you save more than 150 calories per  cup serving, the swap will also keep you more full and you'll get a metabolism boost from the added fiber. 16. Snack on pistachios instead of macadamia nuts. Believe it or not, you get the same amount of calories from 35 pistachios (100 calories) as you would from only five macadamia nuts. 17. Chow down on kale chips rather than potato chips. This is my favorite 'don't knock it 'till you try it' swap. Kale chips are so easy to make at home, and you can spice them up with a little grated parmesan or chili powder. Plus, they're a mere fraction of the calories of potato chips, but with the same crunch factor we crave so often. 18. Add seltzer and some fruit slices to your cocktail instead of soda or fruit juice. One cup of soda or fruit juice can pack on as much as 140 calories. Instead, use seltzer and fruit slices. The fruit provides valuable phytochemicals, such as flavonoids and anthocyanins, which help to combat cancer and stave off the aging process.

## 2017-04-15 LAB — CBC WITH DIFFERENTIAL/PLATELET
BASOS: 1 %
Basophils Absolute: 0 10*3/uL (ref 0.0–0.2)
EOS (ABSOLUTE): 0.3 10*3/uL (ref 0.0–0.4)
EOS: 5 %
HEMATOCRIT: 42.5 % (ref 34.0–46.6)
Hemoglobin: 13.8 g/dL (ref 11.1–15.9)
IMMATURE GRANS (ABS): 0 10*3/uL (ref 0.0–0.1)
IMMATURE GRANULOCYTES: 1 %
Lymphocytes Absolute: 1.7 10*3/uL (ref 0.7–3.1)
Lymphs: 29 %
MCH: 30.9 pg (ref 26.6–33.0)
MCHC: 32.5 g/dL (ref 31.5–35.7)
MCV: 95 fL (ref 79–97)
MONOS ABS: 0.5 10*3/uL (ref 0.1–0.9)
Monocytes: 8 %
NEUTROS ABS: 3.2 10*3/uL (ref 1.4–7.0)
NEUTROS PCT: 56 %
Platelets: 172 10*3/uL (ref 150–379)
RBC: 4.47 x10E6/uL (ref 3.77–5.28)
RDW: 13.2 % (ref 12.3–15.4)
WBC: 5.7 10*3/uL (ref 3.4–10.8)

## 2017-04-15 LAB — LIPID PANEL
Chol/HDL Ratio: 5.9 ratio — ABNORMAL HIGH (ref 0.0–4.4)
Cholesterol, Total: 226 mg/dL — ABNORMAL HIGH (ref 100–199)
HDL: 38 mg/dL — ABNORMAL LOW (ref >39)
LDL Calculated: 124 mg/dL — ABNORMAL HIGH (ref 0–99)
Triglycerides: 321 mg/dL — ABNORMAL HIGH (ref 0–149)
VLDL CHOLESTEROL CAL: 64 mg/dL — AB (ref 5–40)

## 2017-04-15 LAB — COMPREHENSIVE METABOLIC PANEL
A/G RATIO: 1.5 (ref 1.2–2.2)
ALT: 22 IU/L (ref 0–32)
AST: 21 IU/L (ref 0–40)
Albumin: 4.5 g/dL (ref 3.5–4.8)
Alkaline Phosphatase: 95 IU/L (ref 39–117)
BILIRUBIN TOTAL: 0.4 mg/dL (ref 0.0–1.2)
BUN / CREAT RATIO: 23 (ref 12–28)
BUN: 17 mg/dL (ref 8–27)
CALCIUM: 10.4 mg/dL — AB (ref 8.7–10.3)
CHLORIDE: 102 mmol/L (ref 96–106)
CO2: 28 mmol/L (ref 18–29)
Creatinine, Ser: 0.75 mg/dL (ref 0.57–1.00)
GFR, EST AFRICAN AMERICAN: 93 mL/min/{1.73_m2} (ref >59)
GFR, EST NON AFRICAN AMERICAN: 81 mL/min/{1.73_m2} (ref >59)
GLOBULIN, TOTAL: 3.1 g/dL (ref 1.5–4.5)
Glucose: 148 mg/dL — ABNORMAL HIGH (ref 65–99)
POTASSIUM: 5.7 mmol/L — AB (ref 3.5–5.2)
SODIUM: 143 mmol/L (ref 134–144)
TOTAL PROTEIN: 7.6 g/dL (ref 6.0–8.5)

## 2017-04-17 NOTE — Assessment & Plan Note (Signed)
30-300 when checked. - Counseling done regarding disease process and various treatment options, explained importance of WELL controlled BP and BS etc.    Refuses ARB or other BP med. - All questions answered - Handouts given if patient desired them

## 2017-04-17 NOTE — Assessment & Plan Note (Signed)
-   Will check lipid panel - refused statins/ any chol meds in the past. - await results- txmnt options pending results

## 2017-04-17 NOTE — Assessment & Plan Note (Signed)
Low carb diet d/c pt

## 2017-04-17 NOTE — Assessment & Plan Note (Signed)
Counseling done- advised wt loss.  If interested in actively managing weight:  Use lose it or my fitness pal---> track all food and drinks.   Discussed with patient importance of weight loss to help achieve health goals and how increasing weight, correlates to increasing risk of various diseases. (or increasing risk of not controlling existing diseases.)  F/up sooner than planned if you would like to further discuss strategies to lose weight  - Weight watchers recommended;   Declines nutrition counseling with dietary specialist 

## 2017-04-17 NOTE — Assessment & Plan Note (Signed)
Risks associated with noncompliant behavior with my treatment regimen or treatment plan reviewed with patient.   Pt denies depression/ mood disorder, denies significant barriers of education, money etc.  Explained many significant risks associated with these disease processes that if they remain poorly controlled, can even lead to death.  Advised pt that if she/he is going to continue to NOT follow my advice/ direction, it is best they stop coming to our clinic as to not waste my time or their money.  

## 2017-05-03 ENCOUNTER — Other Ambulatory Visit: Payer: Self-pay

## 2017-05-03 MED ORDER — CETIRIZINE HCL 10 MG PO CAPS: 10.0000 mg | ORAL_CAPSULE | Freq: Every day | ORAL | 0 refills | Status: DC

## 2017-05-03 NOTE — Telephone Encounter (Signed)
Received fax from [facility] requesting Cetirizine RX for 90 day supply rather than 30 day supply.  RX sent to pharmacy.  Charyl Bigger, CMA

## 2017-05-31 ENCOUNTER — Encounter: Payer: Self-pay | Admitting: Family Medicine

## 2017-05-31 ENCOUNTER — Telehealth: Payer: Self-pay

## 2017-05-31 ENCOUNTER — Ambulatory Visit (INDEPENDENT_AMBULATORY_CARE_PROVIDER_SITE_OTHER): Payer: Medicare Other | Admitting: Family Medicine

## 2017-05-31 ENCOUNTER — Ambulatory Visit
Admission: RE | Admit: 2017-05-31 | Discharge: 2017-05-31 | Disposition: A | Discharge status: Home / Self Care | Payer: Medicare Other | Source: Ambulatory Visit | Attending: Family Medicine | Admitting: Family Medicine

## 2017-05-31 VITALS — BP 128/79 | HR 76 | Ht 65.0 in | Wt 194.0 lb

## 2017-05-31 DIAGNOSIS — S3992XA Unspecified injury of lower back, initial encounter: Secondary | ICD-10-CM

## 2017-05-31 DIAGNOSIS — M545 Low back pain: Secondary | ICD-10-CM | POA: Diagnosis not present

## 2017-05-31 DIAGNOSIS — Z9181 History of falling: Secondary | ICD-10-CM

## 2017-05-31 DIAGNOSIS — M5442 Lumbago with sciatica, left side: Secondary | ICD-10-CM

## 2017-05-31 DIAGNOSIS — M5441 Lumbago with sciatica, right side: Secondary | ICD-10-CM

## 2017-05-31 DIAGNOSIS — M62838 Other muscle spasm: Secondary | ICD-10-CM

## 2017-05-31 MED ORDER — PREDNISONE 20 MG PO TABS: ORAL_TABLET | ORAL | 0 refills | Status: DC

## 2017-05-31 MED ORDER — CYCLOBENZAPRINE HCL 10 MG PO TABS: 10.0000 mg | ORAL_TABLET | Freq: Three times a day (TID) | ORAL | 0 refills | Status: DC | PRN

## 2017-05-31 NOTE — Progress Notes (Signed)
Pt here for an acute care OV today   Impression and Recommendations:    1. Status post fall   2. Injury of back, initial encounter   3. Paravertebral Muscle spasms   4. Acute left-sided low back pain, with sciatica presence unspecified      No problem-specific Assessment & Plan notes found for this encounter.   The patient was counseled, risk factors were discussed, anticipatory guidance given.  New Prescriptions   CYCLOBENZAPRINE (FLEXERIL) 10 MG TABLET    Take 1 tablet (10 mg total) by mouth 3 (three) times daily as needed for muscle spasms.   PREDNISONE (DELTASONE) 20 MG TABLET    Take 2 tabs po * 2 days, then 1 tabs for 2 d, then 1/2 tab for 2 d    Discontinued Medications   No medications on file     No orders of the defined types were placed in this encounter.    Gross side effects, risk and benefits, and alternatives of medications and treatment plan in general discussed with patient.  Patient is aware that all medications have potential side effects and we are unable to predict every side effect or drug-drug interaction that may occur.   Patient will call with any questions prior to using medication if they have concerns.  Expresses verbal understanding and consents to current therapy and treatment regimen.  No barriers to understanding were identified.  Red flag symptoms and signs discussed in detail.  Patient expressed understanding regarding what to do in case of emergency\urgent symptoms  Please see AVS handed out to patient at the end of our visit for further patient instructions/ counseling done pertaining to today's office visit.   Return if symptoms worsen or fail to improve, for Then follow-up chronic care as previously discussed.     Note: This document was prepared using Dragon voice recognition software and may include unintentional dictation errors.  [name] 2:21  PM --------------------------------------------------------------------------------------------------------------------------------------------------------------------------------------------------------------------------------------------    Subjective:    CC:  Chief Complaint  Patient presents with  . Fall    left side pain with movement - fall 4 days ago     HPI: Sophia Cox is a 71 y.o. female who presents to [place] [facility] today for issues as discussed below.  Got into a friend's bathtub and slipped onto back and buttocks in [place].   Flew back.  This was 4 days ago- Thursday.  Moved around and felt sore, but felt ok.   Now getting twinges or stabbing pain in back, L buttocks.   When she bent down low to get suitcase, felt stabbing pain in L lower back- lasted seconds and then resolved.   Worse is walking on uneven surfaces.   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    - Patient's proud as she has lost 7 pounds since last being seen.  She has been using the lose it Religiously and even she is when she was in [place] a little bit.  She has been trying to walk more.     Wt Readings from Last 3 Encounters:  05/31/17 194 lb (88 kg)  04/14/17 201 lb 8 oz (91.4 kg)  12/03/16 195 lb 1.6 oz (88.5 kg)   BP Readings from Last 3 Encounters:  05/31/17 128/79  04/14/17 136/82  12/03/16 (!) 145/82   BMI Readings from Last 3 Encounters:  05/31/17 32.28 kg/m  04/14/17 33.53 kg/m  12/03/16 32.47 kg/m     Patient Care Team    Relationship Specialty Notifications Start End  [name], DO PCP - General Family Medicine  09/10/16   Evans Lance, MD Consulting Physician Cardiology  09/10/16   Delrae Rend, MD Consulting Physician Endocrinology  09/10/16   Calvert Cantor, MD Consulting Physician Ophthalmology  04/14/17   Zadie Rhine  Clent Demark, MD Consulting Physician Ophthalmology  04/14/17    Comment: retinal specialist     Patient Active Problem List   Diagnosis Date Noted  . Proteinuria 04/14/2017    Priority: High  . Hypertriglyceridemia 04/14/2017    Priority: High  . Controlled diabetes mellitus with microalbuminuria, without long-term current use of insulin ([place]) 09/10/2016    Priority: High  . Patient's noncompliance with other medical treatment and regimen- declines ARB or chol meds 09/10/2016    Priority: High  . Hyperlipidemia 02/01/2016    Priority: High  . Obesity 02/01/2016    Priority: High  . GERD (gastroesophageal reflux disease) 02/01/2016    Priority: Medium  . Hypothyroidism 05/18/2013    Priority: Medium  . Type 2 diabetes mellitus with peripheral neuropathy ([place]) 12/14/2000    Priority: Low  . Allergic rhinitis 08/13/2016  . Esophageal stricture 02/01/2016  . Alternating constipation and diarrhea 05/16/2014  . Preventative health care 12/04/2013  . SVT (supraventricular tachycardia) ([place]) 04/26/2013    Past Medical history, Surgical history, Family history, Social history, Allergies and Medications have been entered into the medical record, reviewed and changed as needed.    Current Meds  Medication Sig  . Blood Glucose Monitoring Suppl (BAYER CONTOUR MONITOR) W/DEVICE KIT 1 each by Does not apply route 2 (two) times daily at 10 AM and 5 PM.  . Cetirizine HCl 10 MG CAPS Take 1 capsule (10 mg total) by mouth daily.  . cholecalciferol (VITAMIN D) 1000 units tablet Take 1,000 Units by mouth daily.  . cyanocobalamin 500 MCG tablet Take 500 mcg by mouth daily.  Marland Kitchen glucose blood test strip Use to test blood glucose 2 times daily. Dx: 250.00  . Insulin Pen Needle (BD PEN NEEDLE NANO U/F) 32G X 4 MM MISC Use to inject insulin 2 times daily. diag code E11.42.insulin dependent  . levothyroxine (SYNTHROID, LEVOTHROID) 88 MCG tablet Take 1 tablet by mouth daily at 8 pm.  . liraglutide (VICTOZA) 18  MG/3ML SOPN Inject 0.2 mLs (1.2 mg total) into the skin daily.  . metFORMIN (GLUCOPHAGE-XR) 500 MG 24 hr tablet TAKE TWO TABLETS BY MOUTH ONCE DAILY WITH  BREAKFAST  . omeprazole (PRILOSEC) 20 MG capsule Take 1 capsule (20 mg total) by mouth daily.  . Turmeric 500 MG CAPS Take by mouth.    Allergies:  Allergies  Allergen Reactions  . Eggs Or Egg-Derived Products Diarrhea  . Penicillins Other (See Comments)    Childhood reaction - hemorrhaged     Review of Systems: General:   Denies fever, chills, unexplained weight loss.  Optho/Auditory:   Denies visual changes, blurred vision/LOV Respiratory:   Denies wheeze, DOE more than baseline levels.  Cardiovascular:   Denies chest pain, palpitations, new onset peripheral edema  Gastrointestinal:   Denies nausea, vomiting, diarrhea, abd pain.  Genitourinary: Denies dysuria, freq/ urgency, flank pain or discharge from genitals.  Endocrine:     Denies hot or cold intolerance, polyuria, polydipsia. Musculoskeletal:   Denies unexplained myalgias, joint swelling, unexplained arthralgias, gait problems.  Skin:  Denies new onset rash, suspicious  lesions Neurological:     Denies dizziness, unexplained weakness, numbness  Psychiatric/Behavioral:   Denies mood changes, suicidal or homicidal ideations, hallucinations    Objective:   Blood pressure 128/79, pulse 76, height _0  (1.651 m), weight 194 lb (88 kg), SpO2 99 %. Body mass index is 32.28 kg/m. General:  Well Developed, well nourished, appropriate for stated age.  Neuro:  Alert and oriented,  extra-ocular muscles intact  HEENT:  Normocephalic, atraumatic, neck supple Skin:  no gross rash, warm, pink. Cardiac:  RRR, S1 S2 Respiratory:  ECTA B/L and A/P, Not using accessory muscles, speaking in full sentences- unlabored. Vascular:  Ext warm, no cyanosis apprec.; cap RF less 2 sec. Psych:  No HI/SI, judgement and insight good, Euthymic mood. Full Affect. Back: Significant paravertebral  muscle spasms on the left in the L3-5 region as well as S1 through 3 region on the left buttocks region. No boney TTP.  Full range of motion without difficulty.  Neurovascular antacids distally, negative straight leg raise bilaterally

## 2017-05-31 NOTE — Patient Instructions (Signed)
Muscle Cramps and Spasms Muscle cramps and spasms are when muscles tighten by themselves. They usually get better within minutes. Muscle cramps are painful. They are usually stronger and last longer than muscle spasms. Muscle spasms may or may not be painful. They can last a few seconds or much longer. Follow these instructions at home:  Drink enough fluid to keep your pee (urine) clear or pale yellow.  Massage, stretch, and relax the muscle.  If directed, apply heat to tight or tense muscles as often as told by your doctor. Use the heat source that your doctor recommends. ? Place a towel between your skin and the heat source. ? Leave the heat on for 20-30 minutes. ? Take off the heat if your skin turns bright red. This is especially important if you are unable to feel pain, heat, or cold. You may have a greater risk of getting burned.  If directed, put ice on the affected area. This may help if you are sore or have pain after a cramp or spasm. ? Put ice in a plastic bag. ? Place a towel between your skin and the bag. ? Leave the ice on for 20 minutes, 2-3 times a day.  Take over-the-counter and prescription medicines only as told by your doctor.  Pay attention to any changes in your symptoms. Contact a doctor if:  Your cramps or spasms get worse or happen more often.  Your cramps or spasms do not get better with time. This information is not intended to replace advice given to you by your health care provider. Make sure you discuss any questions you have with your health care provider. Document Released: 11/12/2008 Document Revised: 01/01/2016 Document Reviewed: 09/03/2015 Elsevier Interactive Patient Education  2018 [place].   Back Pain, Adult Back pain is very common in adults.The cause of back pain is rarely dangerous and the pain often gets better over time.The cause of your back pain may not be known. Some common causes of back pain include:  Strain of the  muscles or ligaments supporting the spine.  Wear and tear (degeneration) of the spinal disks.  Arthritis.  Direct injury to the back.  For many people, back pain may return. Since back pain is rarely dangerous, most people can learn to manage this condition on their own. Follow these instructions at home: Watch your back pain for any changes. The following actions may help to lessen any discomfort you are feeling:  Remain active. It is stressful on your back to sit or stand in one place for long periods of time. Do not sit, drive, or stand in one place for more than 30 minutes at a time. Take short walks on even surfaces as soon as you are able.Try to increase the length of time you walk each day.  Exercise regularly as directed by your health care provider. Exercise helps your back heal faster. It also helps avoid future injury by keeping your muscles strong and flexible.  Do not stay in bed.Resting more than 1-2 days can delay your recovery.  Pay attention to your body when you bend and lift. The most comfortable positions are those that put less stress on your recovering back. Always use proper lifting techniques, including: ? Bending your knees. ? Keeping the load close to your body. ? Avoiding twisting.  Find a comfortable position to sleep. Use a firm mattress and lie on your side with your knees slightly bent. If you lie on your back, put  a pillow under your knees.  Avoid feeling anxious or stressed.Stress increases muscle tension and can worsen back pain.It is important to recognize when you are anxious or stressed and learn ways to manage it, such as with exercise.  Take medicines only as directed by your health care provider. Over-the-counter medicines to reduce pain and inflammation are often the most helpful.Your health care provider may prescribe muscle relaxant drugs.These medicines help dull your pain so you can more quickly return to your normal activities and healthy  exercise.  Apply ice to the injured area: ? Put ice in a plastic bag. ? Place a towel between your skin and the bag. ? Leave the ice on for 20 minutes, 2-3 times a day for the first 2-3 days. After that, ice and heat may be alternated to reduce pain and spasms.  Maintain a healthy weight. Excess weight puts extra stress on your back and makes it difficult to maintain good posture.  Contact a health care provider if:  You have pain that is not relieved with rest or medicine.  You have increasing pain going down into the legs or buttocks.  You have pain that does not improve in one week.  You have night pain.  You lose weight.  You have a fever or chills. Get help right away if:  You develop new bowel or bladder control problems.  You have unusual weakness or numbness in your arms or legs.  You develop nausea or vomiting.  You develop abdominal pain.  You feel faint. This information is not intended to replace advice given to you by your health care provider. Make sure you discuss any questions you have with your health care provider. Document Released: 11/30/2005 Document Revised: 04/09/2016 Document Reviewed: 04/03/2014 Elsevier Interactive Patient Education  2017 [employer].

## 2017-05-31 NOTE — Telephone Encounter (Signed)
Patient informed of xray results.  Referral to neurosurgery added per Dr Raliegh Scarlet.

## 2017-05-31 NOTE — Telephone Encounter (Signed)
Lumbar spine xray results in.  Provider aware.

## 2017-06-02 ENCOUNTER — Other Ambulatory Visit: Payer: Self-pay | Admitting: Family Medicine

## 2017-06-02 DIAGNOSIS — H43813 Vitreous degeneration, bilateral: Secondary | ICD-10-CM | POA: Diagnosis not present

## 2017-06-02 DIAGNOSIS — E1129 Type 2 diabetes mellitus with other diabetic kidney complication: Secondary | ICD-10-CM

## 2017-06-02 DIAGNOSIS — H353113 Nonexudative age-related macular degeneration, right eye, advanced atrophic without subfoveal involvement: Secondary | ICD-10-CM | POA: Diagnosis not present

## 2017-06-02 DIAGNOSIS — E119 Type 2 diabetes mellitus without complications: Secondary | ICD-10-CM | POA: Diagnosis not present

## 2017-06-02 DIAGNOSIS — R809 Proteinuria, unspecified: Principal | ICD-10-CM

## 2017-06-02 DIAGNOSIS — H353124 Nonexudative age-related macular degeneration, left eye, advanced atrophic with subfoveal involvement: Secondary | ICD-10-CM | POA: Diagnosis not present

## 2017-06-02 LAB — HM DIABETES EYE EXAM

## 2017-06-02 MED ORDER — BAYER CONTOUR MONITOR W/DEVICE KIT: 1.0000 | PACK | Freq: Two times a day (BID) | 0 refills | Status: AC

## 2017-06-02 NOTE — Telephone Encounter (Signed)
Bayer glucometer sent to pharmacy with diagnosis code E11.29.

## 2017-06-02 NOTE — Telephone Encounter (Signed)
Pt called states [facility] on [address], [place] states they never rcvd ( New diagnostic code for test strips)--Pt used [facility] for diabetes testing--Pls contact pharmacy with necessary information for Rx refill on test strips. --glh

## 2017-06-03 ENCOUNTER — Emergency Department (HOSPITAL_COMMUNITY)
Admission: EM | Admit: 2017-06-03 | Discharge: 2017-06-03 | Disposition: A | Discharge status: Home / Self Care | Payer: Medicare Other | Attending: Emergency Medicine | Admitting: Emergency Medicine

## 2017-06-03 ENCOUNTER — Emergency Department (HOSPITAL_COMMUNITY): Payer: Medicare Other

## 2017-06-03 ENCOUNTER — Encounter (HOSPITAL_COMMUNITY): Payer: Self-pay | Admitting: Emergency Medicine

## 2017-06-03 DIAGNOSIS — I10 Essential (primary) hypertension: Secondary | ICD-10-CM | POA: Insufficient documentation

## 2017-06-03 DIAGNOSIS — Z7984 Long term (current) use of oral hypoglycemic drugs: Secondary | ICD-10-CM | POA: Diagnosis not present

## 2017-06-03 DIAGNOSIS — S3992XA Unspecified injury of lower back, initial encounter: Secondary | ICD-10-CM | POA: Diagnosis not present

## 2017-06-03 DIAGNOSIS — Z7902 Long term (current) use of antithrombotics/antiplatelets: Secondary | ICD-10-CM | POA: Insufficient documentation

## 2017-06-03 DIAGNOSIS — E119 Type 2 diabetes mellitus without complications: Secondary | ICD-10-CM | POA: Insufficient documentation

## 2017-06-03 DIAGNOSIS — Z79899 Other long term (current) drug therapy: Secondary | ICD-10-CM | POA: Insufficient documentation

## 2017-06-03 DIAGNOSIS — M545 Low back pain, unspecified: Secondary | ICD-10-CM

## 2017-06-03 DIAGNOSIS — M25552 Pain in left hip: Secondary | ICD-10-CM | POA: Diagnosis not present

## 2017-06-03 DIAGNOSIS — S79912A Unspecified injury of left hip, initial encounter: Secondary | ICD-10-CM | POA: Diagnosis not present

## 2017-06-03 DIAGNOSIS — M25521 Pain in right elbow: Secondary | ICD-10-CM | POA: Diagnosis not present

## 2017-06-03 DIAGNOSIS — M546 Pain in thoracic spine: Secondary | ICD-10-CM | POA: Diagnosis not present

## 2017-06-03 MED ORDER — HYDROCODONE-ACETAMINOPHEN 5-325 MG PO TABS
1.0000 | ORAL_TABLET | Freq: Once | ORAL | Status: AC
  Administered 2017-06-03: 1 via ORAL
  Filled 2017-06-03: qty 1

## 2017-06-03 MED ORDER — HYDROCODONE-ACETAMINOPHEN 5-325 MG PO TABS: 1.0000 | ORAL_TABLET | Freq: Once | ORAL | Status: DC

## 2017-06-03 NOTE — ED Triage Notes (Signed)
Pt states she fell Thursday night- has compressed fx in L2 and L3. Pt tripped fell again last night. Pt states she is in terrible pain and can't sit on her left side. Pt states she is can ambulate and stand. Pt also complaining of right elbow pain from fall. Denies LOC or hitting head.

## 2017-06-03 NOTE — ED Provider Notes (Signed)
[facility] DEPT Provider Note   CSN: [id number] Arrival date & time: 06/03/17  1043     History   Chief Complaint Chief Complaint  Patient presents with  . Fall    HPI Sophia Cox is a 71 y.o. female.  HPI  71 y.o. female with a hx of HTN, DM2, presents to the Emergency Department today due to a mechanical fall last night. No head trauma or LOC. Pt with known compression fractures of L2-L3 that was diagnosed this past Monday by PCP due to mechanical fall last Thursday. Notes pain on left hip as well as worsening pain lumbar spine. Rates pain 5/10 and described as aching sensation. Denies loss of bowel or bladder function. No saddle anesthesia. No numbness/tingling. Pt states she is working on scheduling a follow up with Neurosurgery via PCP due to fall last week. No fevers. No CP/SOB/ABD pain. No other symptoms noted.    Past Medical History:  Diagnosis Date  . Arthritis    "left knee" (06/21/2013)  . ITGPQDIY(641.5)    "weekly" (06/21/2013)  . Hypertension   . Hypothyroidism   . Pneumonia [phone]   "quit a few times" (06/21/2013)  . Supraventricular tachycardia ([place])   . Type II diabetes mellitus [facility])     Patient Active Problem List   Diagnosis Date Noted  . Proteinuria 04/14/2017  . Hypertriglyceridemia 04/14/2017  . Controlled diabetes mellitus with microalbuminuria, without long-term current use of insulin ([place]) 09/10/2016  . Patient's noncompliance with other medical treatment and regimen- declines ARB or chol meds 09/10/2016  . Allergic rhinitis 08/13/2016  . Hyperlipidemia 02/01/2016  . GERD (gastroesophageal reflux disease) 02/01/2016  . Obesity 02/01/2016  . Esophageal stricture 02/01/2016  . Alternating constipation and diarrhea 05/16/2014  . Preventative health care 12/04/2013  . Hypothyroidism 05/18/2013  . SVT (supraventricular tachycardia) ([place]) 04/26/2013  . Type 2 diabetes mellitus with peripheral neuropathy ([place]) 12/14/2000    Past  Surgical History:  Procedure Laterality Date  . CATARACT EXTRACTION Bilateral   . CHOLECYSTECTOMY  ~ 1989  . SUPRAVENTRICULAR TACHYCARDIA ABLATION  06/21/2013  . SUPRAVENTRICULAR TACHYCARDIA ABLATION N/A 06/21/2013   Procedure: SUPRAVENTRICULAR TACHYCARDIA ABLATION;  Surgeon: Evans Lance, MD;  Location: [facility] CATH LAB;  Service: Cardiovascular;  Laterality: N/A;  . TONSILLECTOMY  ~ 1952  . TUBAL LIGATION  1971    OB History    No data available       Home Medications    Prior to Admission medications   Medication Sig Start Date End Date Taking? Authorizing Provider  Blood Glucose Monitoring Suppl (BAYER CONTOUR MONITOR) w/Device KIT 1 each by Does not apply route 2 (two) times daily at 10 AM and 5 PM. 06/02/17   Opalski, Neoma Laming, DO  Cetirizine HCl 10 MG CAPS Take 1 capsule (10 mg total) by mouth daily. 05/03/17 06/02/17  [name], DO  cholecalciferol (VITAMIN D) 1000 units tablet Take 1,000 Units by mouth daily.    [provider]  cyanocobalamin 500 MCG tablet Take 500 mcg by mouth daily.    [provider]  cyclobenzaprine (FLEXERIL) 10 MG tablet Take 1 tablet (10 mg total) by mouth 3 (three) times daily as needed for muscle spasms. 05/31/17   Opalski, Neoma Laming, DO  Ginger Root POWD Take 3,000 mg by mouth daily at 8 pm.    [provider]  glucose blood test strip Use to test blood glucose 2 times daily. Dx: 250.00 04/14/17   [name], DO  Insulin Pen Needle (BD PEN NEEDLE  NANO U/F) 32G X 4 MM MISC Use to inject insulin 2 times daily. diag code E11.42.insulin dependent 06/23/16   Sid Falcon, MD  levothyroxine (SYNTHROID, LEVOTHROID) 88 MCG tablet Take 1 tablet by mouth daily at 8 pm. 02/24/17   [provider]  liraglutide (VICTOZA) 18 MG/3ML SOPN Inject 0.2 mLs (1.2 mg total) into the skin daily. 04/14/17   Opalski, Neoma Laming, DO  metFORMIN (GLUCOPHAGE-XR) 500 MG 24 hr tablet TAKE TWO TABLETS BY MOUTH ONCE DAILY WITH  BREAKFAST 04/14/17    Opalski, Neoma Laming, DO  omeprazole (PRILOSEC) 20 MG capsule Take 1 capsule (20 mg total) by mouth daily. 04/14/17   Opalski, Neoma Laming, DO  predniSONE (DELTASONE) 20 MG tablet Take 2 tabs po * 2 days, then 1 tabs for 2 d, then 1/2 tab for 2 d 05/31/17   [name], DO  Turmeric 500 MG CAPS Take by mouth.    [provider]    Family History Family History  Problem Relation Age of Onset  . Lung cancer Mother   . Alcohol abuse Mother   . Stroke Father   . Diabetes Father   . Alcohol abuse Father   . Heart attack Father   . Diabetes Paternal Uncle   . Stroke Maternal Grandfather   . Cancer Paternal Grandmother 48       cervical  . Diabetes Paternal Grandmother   . Heart attack Paternal Grandfather   . Colon cancer Neg Hx   . Esophageal cancer Neg Hx   . Stomach cancer Neg Hx     Social History Social History  Substance Use Topics  . Smoking status: Never Smoker  . Smokeless tobacco: Never Used  . Alcohol use 2.4 oz/week    4 Glasses of wine per week     Allergies   Eggs or egg-derived products and Penicillins   Review of Systems Review of Systems ROS reviewed and all are negative for acute change except as noted in the HPI.  Physical Exam Updated Vital Signs BP (!) 168/110 (BP Location: Left Arm)   Pulse 87   Temp 98 F (36.7 C) (Oral)   Resp 18   SpO2 97%   Physical Exam  Constitutional: She is oriented to person, place, and time. Vital signs are normal. She appears well-developed and well-nourished.  HENT:  Head: Normocephalic and atraumatic.  Right Ear: Hearing normal.  Left Ear: Hearing normal.  Eyes: Conjunctivae and EOM are normal. Pupils are equal, round, and reactive to light.  Neck: Normal range of motion. Neck supple.  Cardiovascular: Normal rate, regular rhythm, normal heart sounds and intact distal pulses.   Pulmonary/Chest: Effort normal and breath sounds normal.  Abdominal: Soft. There is no tenderness.  Musculoskeletal: Normal range  of motion.  TTP thoracic/lumbar spinous process. No palpable or visible deformities. No step offs appreciated. Left hip with pain on internal/external rotation. No shortening of leg. NVI. Distal pulses appreciated.   Neurological: She is alert and oriented to person, place, and time. She has normal strength. No sensory deficit.  BLE motor/sensation intact. Strength equal.   Skin: Skin is warm and dry.  Psychiatric: She has a normal mood and affect. Her speech is normal and behavior is normal. Thought content normal.  Nursing note and vitals reviewed.  ED Treatments / Results  Labs (all labs ordered are listed, but only abnormal results are displayed) Labs Reviewed - No data to display  EKG  EKG Interpretation None      Radiology Dg Thoracic  Spine 2 View  Result Date: 06/03/2017 CLINICAL DATA:  Dorsalgia with several recent falls EXAM: THORACIC SPINE 3 VIEWS COMPARISON:  None. FINDINGS: Frontal lateral, and swimmer's views were obtained. There is no fracture or spondylolisthesis in the thoracic region. There is slight disc space narrowing at several levels. No erosive change or paraspinous lesion. There is aortic atherosclerosis. IMPRESSION: Slight osteoarthritic change at several levels. No thoracic fracture or spondylolisthesis evident. There is aortic atherosclerosis. Electronically Signed   By: Lowella Grip III M.D.   On: 06/03/2017 14:08   Dg Lumbar Spine Complete  Result Date: 06/03/2017 CLINICAL DATA:  Lumbago with several recent falls EXAM: LUMBAR SPINE - COMPLETE 4+ VIEW COMPARISON:  May 31, 2017 FINDINGS: Frontal, lateral, spot lumbosacral lateral, and bilateral oblique views were obtained. There are 5 non-rib-bearing lumbar type vertebral bodies. The areas of questionable subtle fractures of the L2 and L3 vertebral bodies described on recent prior study appear less apparent than on previous study, possibly due to differences in position. There may be a slight superior  endplate injury at L3. There is no clear anterior wedging on the current study. No spondylolisthesis. There is mild disc space narrowing at L5-S1. IMPRESSION: The areas of recent question fractures at L2 and L3 are not well seen currently. Slight differences in positioning may account for the difference in appearance. Given questionable recent injury in the lumbar region, correlation with lumbar MR to assess for marrow edema may be reasonable. No spondylolisthesis. Osteoarthritic change in the lower lumbar spine. Electronically Signed   By: Lowella Grip III M.D.   On: 06/03/2017 14:07   Dg Hip Unilat W Or Wo Pelvis 2-3 Views Left  Result Date: 06/03/2017 CLINICAL DATA:  Multiple falls EXAM: DG HIP (WITH OR WITHOUT PELVIS) 2-3V LEFT COMPARISON:  None. FINDINGS: There is no evidence of hip fracture or dislocation. There is mild subchondral sclerosis of the acetabulum. There are femoral artery calcifications. IMPRESSION: No fracture or dislocation of the left hip. Electronically Signed   By: Ulyses Jarred M.D.   On: 06/03/2017 14:04    Procedures Procedures (including critical care time)  Medications Ordered in ED Medications - No data to display   Initial Impression / Assessment and Plan / ED Course  I have reviewed the triage vital signs and the nursing notes.  Pertinent labs & imaging results that were available during my care of the patient were reviewed by me and considered in my medical decision making (see chart for details).  Final Clinical Impressions(s) / ED Diagnoses   {I have reviewed and evaluated the relevant imaging studies.  {I have reviewed the relevant previous healthcare records.  {I obtained HPI from historian.   ED Course:  Assessment: Pt is a 71 y.o. female with hx  HTN, DM2 who presents due to mechanical fall last night. Previous fall x 1 week ago and seen by PCP for this. Dx compression fracture L2-L3. Pt presents due to pain in lumbar spine as well as left hip. No  head trauma or LOC. On exam, pt in NAD. Nontoxic/nonseptic appearing. VSS. Afebrile. Lungs CTA. Heart RRR. Abdomen nontender soft. Lumbar spine with mild tenderness. No palpable or visible deformities. Left hip with pain on internal/external rotation. Pain with weight bearing. Imaging unremarkable. Due to continue pain in left hip, CT Hip ordered and pending . Given analgesia in ED. Dispo pending CT left hip. Anticipate DC home if unremarkable and follow up with PCP. Pt has neurosurgery follow up through PCP for lumbar  spine.   Disposition/Plan:  Pending CT Left Hip Pt acknowledges and agrees with plan  Supervising Physician Orlie Dakin, MD  Final diagnoses:  None    New Prescriptions New Prescriptions   No medications on file     Conni Slipper 06/03/17 [place], MD 06/03/17 [phone]

## 2017-06-03 NOTE — Discharge Instructions (Addendum)
Use ice and heat alternating 20 minutes on, 20 minutes off 3-4 times daily. Attempt the back exercises and stretches 1-2 times daily as tolerated. Please follow-up with your primary care provider in your neurosurgeon for further evaluation and treatment. Please return to the emergency department if you develop any new or worsening symptoms.

## 2017-06-03 NOTE — ED Provider Notes (Signed)
Sign out from Shary Decamp, PA-C at shift change  Mechanical fall last Thurs, saw PCP Monday  Lumbar Compression fx diagnosed by PCP Monday. Fell again yesterday, mechanical fall again.  CT Hip pending, d/c home to follow up with neurosurgery and PCP.  CT of the left hip is negative. When I sat patient up to get dressed, she was in a lot of pain. I will order a lumbar corset for supportive treatment. This helped patient's symptoms.  Patient has Flexeril 10 mg 3 times a day PRN at home as prescribed by PCP. I advised Tylenol in conjunction. She is currently on steroids, so she is advised to avoid NSAIDs until steroids complete. Follow-up to PCP a neurosurgeon. I discussed patient case with Dr. Winfred Leeds who agrees with plan.    Frederica Kuster, PA-C 06/03/17 [place], MD 06/04/17 [phone]

## 2017-06-03 NOTE — ED Provider Notes (Signed)
Patient tripped and fell in the bathtub 7 days ago injuring her lower back. Complained of left-sided paralumbar pain. Was determined to have lumbar compression fractures. She is was alert and ambulatory and tripped again yesterday injuring her left pelvis and patient also complains of lower and upper her back pain. No other complaint. Pain is worse with walking or moving her left lower extremity On exam she is alert and in no distress Entire spine is without point tenderness. Pelvis stable. Left lower extremity without deformity, shortening or external rotation. Deep pulse 2+. Good capillary refill. She has pain at hip on internal and external rotation of the thigh. The right upper extremity with abrasion at the elbow without swelling or deformity or tenderness. All other extremities or contusion abrasion or tenderness neurovascularly intact   Orlie Dakin, MD 06/03/17 1542

## 2017-06-08 ENCOUNTER — Other Ambulatory Visit: Payer: Self-pay

## 2017-06-08 ENCOUNTER — Telehealth: Payer: Self-pay | Admitting: Family Medicine

## 2017-06-08 DIAGNOSIS — E1142 Type 2 diabetes mellitus with diabetic polyneuropathy: Secondary | ICD-10-CM

## 2017-06-08 MED ORDER — GLUCOSE BLOOD VI STRP: ORAL_STRIP | 12 refills | Status: AC

## 2017-06-08 NOTE — Telephone Encounter (Signed)
New rx with diagnosis code sent to pharmacy, they cannot take verbal. (see other phone encounter)

## 2017-06-08 NOTE — Telephone Encounter (Signed)
Pt states Test strips refill request still not called in correctly to [facility] on [address]---Pt hasn't been able to test in days now and really needs this taken care of. --glh  Original message/request sent on 06/02/17.

## 2017-06-08 NOTE — Telephone Encounter (Signed)
Another order has been sent to [facility] with diabetes diagnosis code.  Pharmacy says they cannot take a verbal order for this.

## 2017-06-15 DIAGNOSIS — S3992XD Unspecified injury of lower back, subsequent encounter: Secondary | ICD-10-CM | POA: Diagnosis not present

## 2017-07-13 ENCOUNTER — Other Ambulatory Visit: Payer: Self-pay

## 2017-07-13 DIAGNOSIS — J301 Allergic rhinitis due to pollen: Secondary | ICD-10-CM

## 2017-07-13 MED ORDER — CETIRIZINE HCL 10 MG PO CAPS: 10.0000 mg | ORAL_CAPSULE | Freq: Every day | ORAL | 0 refills | Status: DC

## 2017-07-31 ENCOUNTER — Other Ambulatory Visit: Payer: Self-pay | Admitting: Family Medicine

## 2017-08-04 ENCOUNTER — Other Ambulatory Visit: Payer: Self-pay | Admitting: Family Medicine

## 2017-08-09 ENCOUNTER — Encounter: Payer: Self-pay | Admitting: Adult Health

## 2017-08-09 ENCOUNTER — Ambulatory Visit (INDEPENDENT_AMBULATORY_CARE_PROVIDER_SITE_OTHER): Payer: Medicare Other | Admitting: Adult Health

## 2017-08-09 VITALS — BP 124/75 | HR 78 | Temp 97.8°F | Ht 65.0 in | Wt 179.1 lb

## 2017-08-09 DIAGNOSIS — R350 Frequency of micturition: Secondary | ICD-10-CM

## 2017-08-09 DIAGNOSIS — R3915 Urgency of urination: Secondary | ICD-10-CM | POA: Insufficient documentation

## 2017-08-09 DIAGNOSIS — R8290 Unspecified abnormal findings in urine: Secondary | ICD-10-CM

## 2017-08-09 LAB — POCT URINALYSIS DIPSTICK
BILIRUBIN UA: NEGATIVE
Glucose, UA: NEGATIVE
NITRITE UA: POSITIVE
PH UA: 6 (ref 5.0–8.0)
Protein, UA: 100
Spec Grav, UA: 1.02 (ref 1.010–1.025)
Urobilinogen, UA: 0.2 E.U./dL

## 2017-08-09 MED ORDER — CIPROFLOXACIN HCL 250 MG PO TABS: 250.0000 mg | ORAL_TABLET | Freq: Two times a day (BID) | ORAL | 0 refills | Status: DC

## 2017-08-09 MED ORDER — FLUCONAZOLE 150 MG PO TABS: 150.0000 mg | ORAL_TABLET | Freq: Once | ORAL | 0 refills | Status: AC

## 2017-08-09 NOTE — Assessment & Plan Note (Signed)
UA + Specimen sent off for culture. She is allergic to PCN (she reports hemorrhage as reaction). Her insurance does not cover Macrobid, therefore she was placed on Cipro 250mg  BID x 3. She drinks very little water, stressed to drink at least 70-80 ounces/day. Instructed to call if sx's persist after ABX completed.

## 2017-08-09 NOTE — Patient Instructions (Addendum)
Urinary Tract Infection, Adult A urinary tract infection (UTI) is an infection of any part of the urinary tract, which includes the kidneys, ureters, bladder, and urethra. These organs make, store, and get rid of urine in the body. UTI can be a bladder infection (cystitis) or kidney infection (pyelonephritis). What are the causes? This infection may be caused by fungi, viruses, or bacteria. Bacteria are the most common cause of UTIs. This condition can also be caused by repeated incomplete emptying of the bladder during urination. What increases the risk? This condition is more likely to develop if:  You ignore your need to urinate or hold urine for long periods of time.  You do not empty your bladder completely during urination.  You wipe back to front after urinating or having a bowel movement, if you are female.  You are uncircumcised, if you are female.  You are constipated.  You have a urinary catheter that stays in place (indwelling).  You have a weak defense (immune) system.  You have a medical condition that affects your bowels, kidneys, or bladder.  You have diabetes.  You take antibiotic medicines frequently or for long periods of time, and the antibiotics no longer work well against certain types of infections (antibiotic resistance).  You take medicines that irritate your urinary tract.  You are exposed to chemicals that irritate your urinary tract.  You are female.  What are the signs or symptoms? Symptoms of this condition include:  Fever.  Frequent urination or passing small amounts of urine frequently.  Needing to urinate urgently.  Pain or burning with urination.  Urine that smells bad or unusual.  Cloudy urine.  Pain in the lower abdomen or back.  Trouble urinating.  Blood in the urine.  Vomiting or being less hungry than normal.  Diarrhea or abdominal pain.  Vaginal discharge, if you are female.  How is this diagnosed? This condition is  diagnosed with a medical history and physical exam. You will also need to provide a urine sample to test your urine. Other tests may be done, including:  Blood tests.  Sexually transmitted disease (STD) testing.  If you have had more than one UTI, a cystoscopy or imaging studies may be done to determine the cause of the infections. How is this treated? Treatment for this condition often includes a combination of two or more of the following:  Antibiotic medicine.  Other medicines to treat less common causes of UTI.  Over-the-counter medicines to treat pain.  Drinking enough water to stay hydrated.  Follow these instructions at home:  Take over-the-counter and prescription medicines only as told by your health care provider.  If you were prescribed an antibiotic, take it as told by your health care provider. Do not stop taking the antibiotic even if you start to feel better.  Avoid alcohol, caffeine, tea, and carbonated beverages. They can irritate your bladder.  Drink enough fluid to keep your urine clear or pale yellow.  Keep all follow-up visits as told by your health care provider. This is important.  Make sure to: ? Empty your bladder often and completely. Do not hold urine for long periods of time. ? Empty your bladder before and after sex. ? Wipe from front to back after a bowel movement if you are female. Use each tissue one time when you wipe. Contact a health care provider if:  You have back pain.  You have a fever.  You feel nauseous or vomit.  Your symptoms do not  get better after 3 days.  Your symptoms go away and then return. Get help right away if:  You have severe back pain or lower abdominal pain.  You are vomiting and cannot keep down any medicines or water. This information is not intended to replace advice given to you by your health care provider. Make sure you discuss any questions you have with your health care provider. Document Released:  09/09/2005 Document Revised: 05/13/2016 Document Reviewed: 10/21/2015 Elsevier Interactive Patient Education  2017 [place].  Please increase fluids,, strive for at least 80 ounces of water/cranberry juice a day. Please take the Cipro 250mg  twice a day for 3 days. We will call when urine culture results are available. If symptoms persist after Cipro completed, then please call clinic.

## 2017-08-09 NOTE — Addendum Note (Signed)
Addended by: Mina Marble D on: 08/09/2017 03:44 PM   Modules accepted: Orders

## 2017-08-09 NOTE — Progress Notes (Signed)
Subjective:    Patient ID: Sophia Cox, female    DOB: 1946-10-17, 71 y.o.   MRN: [id number]  HPI:  Sophia Cox presents with frequency, urgency, and "bladder fullness" that have been occurring since June.  She denies N/V/fever/chills/abdominal pain.  She denies blood in stool, however believes that she may have seen blood in urine "just a few days ago". She denies hx of recurrent UTIs.  She has not ben taking any OTC remedies and estimates to only drink one glass of water a day, denies drinking any other fluids. She denies flank pain.  Patient Care Team    Relationship Specialty Notifications Start End  [name], DO PCP - General Family Medicine  09/10/16   Evans Lance, MD Consulting Physician Cardiology  09/10/16   Delrae Rend, MD Consulting Physician Endocrinology  09/10/16   Calvert Cantor, MD Consulting Physician Ophthalmology  04/14/17   Zadie Rhine Clent Demark, MD Consulting Physician Ophthalmology  04/14/17    Comment: retinal specialist    Patient Active Problem List   Diagnosis Date Noted  . Proteinuria 04/14/2017  . Hypertriglyceridemia 04/14/2017  . Controlled diabetes mellitus with microalbuminuria, without long-term current use of insulin ([place]) 09/10/2016  . Patient's noncompliance with other medical treatment and regimen- declines ARB or chol meds 09/10/2016  . Allergic rhinitis 08/13/2016  . Hyperlipidemia 02/01/2016  . GERD (gastroesophageal reflux disease) 02/01/2016  . Obesity 02/01/2016  . Esophageal stricture 02/01/2016  . Alternating constipation and diarrhea 05/16/2014  . Preventative health care 12/04/2013  . Hypothyroidism 05/18/2013  . SVT (supraventricular tachycardia) ([place]) 04/26/2013  . Type 2 diabetes mellitus with peripheral neuropathy ([place]) 12/14/2000     Past Medical History:  Diagnosis Date  . Arthritis    "left knee" (06/21/2013)  . LKGMWNUU(725.3)    "weekly" (06/21/2013)  . Hypertension   . Hypothyroidism   . Pneumonia  [phone]   "quit a few times" (06/21/2013)  . Supraventricular tachycardia ([place])   . Type II diabetes mellitus ([place])      Past Surgical History:  Procedure Laterality Date  . CATARACT EXTRACTION Bilateral   . CHOLECYSTECTOMY  ~ 1989  . SUPRAVENTRICULAR TACHYCARDIA ABLATION  06/21/2013  . SUPRAVENTRICULAR TACHYCARDIA ABLATION N/A 06/21/2013   Procedure: SUPRAVENTRICULAR TACHYCARDIA ABLATION;  Surgeon: Evans Lance, MD;  Location: [facility] CATH LAB;  Service: Cardiovascular;  Laterality: N/A;  . TONSILLECTOMY  ~ 1952  . TUBAL LIGATION  1971     Family History  Problem Relation Age of Onset  . Lung cancer Mother   . Alcohol abuse Mother   . Stroke Father   . Diabetes Father   . Alcohol abuse Father   . Heart attack Father   . Diabetes Paternal Uncle   . Stroke Maternal Grandfather   . Cancer Paternal Grandmother 49       cervical  . Diabetes Paternal Grandmother   . Heart attack Paternal Grandfather   . Colon cancer Neg Hx   . Esophageal cancer Neg Hx   . Stomach cancer Neg Hx      History  Drug Use No     History  Alcohol Use  . 2.4 oz/week  . 4 Glasses of wine per week     History  Smoking Status  . Never Smoker  Smokeless Tobacco  . Never Used     Outpatient Encounter Prescriptions as of 08/09/2017  Medication Sig  . Blood Glucose Monitoring Suppl (BAYER CONTOUR MONITOR) w/Device KIT 1 each by Does not apply route 2 (  two) times daily at 10 AM and 5 PM.  . cetirizine (ZYRTEC) 10 MG tablet TAKE 1 TABLET BY MOUTH EVERY DAY  . Cetirizine HCl 10 MG CAPS Take 1 capsule (10 mg total) by mouth daily.  . cholecalciferol (VITAMIN D) 1000 units tablet Take 1,000 Units by mouth daily.  . cyanocobalamin 500 MCG tablet Take 500 mcg by mouth daily.  Marland Kitchen glucose blood test strip Use to test blood glucose 2 times daily. Dx: e11.29  . Insulin Pen Needle (BD PEN NEEDLE NANO U/F) 32G X 4 MM MISC Use to inject insulin 2 times daily. diag code E11.42.insulin dependent  .  levothyroxine (SYNTHROID, LEVOTHROID) 88 MCG tablet Take 1 tablet by mouth daily at 8 pm.  . metFORMIN (GLUCOPHAGE-XR) 500 MG 24 hr tablet TAKE TWO TABLETS BY MOUTH ONCE DAILY WITH  BREAKFAST  . Turmeric 500 MG CAPS Take by mouth.  . [DISCONTINUED] cyclobenzaprine (FLEXERIL) 10 MG tablet Take 1 tablet (10 mg total) by mouth 3 (three) times daily as needed for muscle spasms.  . [DISCONTINUED] Ginger Root POWD Take 3,000 mg by mouth daily at 8 pm.  . [DISCONTINUED] predniSONE (DELTASONE) 20 MG tablet Take 2 tabs po * 2 days, then 1 tabs for 2 d, then 1/2 tab for 2 d  . ciprofloxacin (CIPRO) 250 MG tablet Take 1 tablet (250 mg total) by mouth 2 (two) times daily.  Marland Kitchen liraglutide (VICTOZA) 18 MG/3ML SOPN Inject 0.2 mLs (1.2 mg total) into the skin daily. (Patient not taking: Reported on 08/09/2017)  . omeprazole (PRILOSEC) 20 MG capsule Take 1 capsule (20 mg total) by mouth daily. (Patient not taking: Reported on 08/09/2017)   No facility-administered encounter medications on file as of 08/09/2017.     Allergies: Eggs or egg-derived products and Penicillins  Body mass index is 29.8 kg/m.  Blood pressure 124/75, pulse 78, temperature 97.8 F (36.6 C), temperature source Oral, height 5' 5"  (1.651 m), weight 179 lb 1.6 oz (81.2 kg).     Review of Systems  Constitutional: Positive for fatigue. Negative for activity change, appetite change, chills, diaphoresis, fever and unexpected weight change.  Respiratory: Negative for cough, chest tightness, shortness of breath, wheezing and stridor.   Cardiovascular: Negative for chest pain, palpitations and leg swelling.  Gastrointestinal: Negative for abdominal distention, abdominal pain, blood in stool, constipation, diarrhea, nausea and vomiting.  Endocrine: Negative for cold intolerance, heat intolerance, polydipsia, polyphagia and polyuria.  Genitourinary: Positive for frequency, hematuria and urgency. Negative for decreased urine volume, difficulty  urinating, dysuria, flank pain, pelvic pain, vaginal discharge and vaginal pain.  Hematological: Does not bruise/bleed easily.       Objective:   Physical Exam  Constitutional: She is oriented to person, place, and time. She appears well-developed and well-nourished. No distress.  HENT:  Head: Normocephalic and atraumatic.  Right Ear: External ear normal.  Left Ear: External ear normal.  Eyes: Pupils are equal, round, and reactive to light. Conjunctivae are normal.  Cardiovascular: Normal rate, regular rhythm, normal heart sounds and intact distal pulses.   Pulmonary/Chest: Effort normal and breath sounds normal. No respiratory distress. She has no wheezes. She has no rales. She exhibits no tenderness.  Abdominal: Soft. Bowel sounds are normal. She exhibits no distension and no mass. There is tenderness in the right lower quadrant and suprapubic area. There is no rebound, no guarding and no CVA tenderness.  Neurological: She is alert and oriented to person, place, and time.  Skin: Skin is warm and dry. No  rash noted. She is not diaphoretic. No erythema. No pallor.  Psychiatric: She has a normal mood and affect. Her behavior is normal. Judgment and thought content normal.  Nursing note and vitals reviewed.         Assessment & Plan:   1. Urinary frequency   2. Urinary urgency   3. Abnormal urinalysis     Abnormal urinalysis UA + Specimen sent off for culture. She is allergic to PCN (she reports hemorrhage as reaction). Her insurance does not cover Macrobid, therefore she was placed on Cipro 219m BID x 3. She drinks very little water, stressed to drink at least 70-80 ounces/day. Instructed to call if sx's persist after ABX completed.     FOLLOW-UP:  Return if symptoms worsen or fail to improve.

## 2017-08-12 ENCOUNTER — Telehealth: Payer: Self-pay | Admitting: Family Medicine

## 2017-08-12 ENCOUNTER — Other Ambulatory Visit: Payer: Self-pay | Admitting: Adult Health

## 2017-08-12 LAB — CULTURE, URINE COMPREHENSIVE

## 2017-08-12 MED ORDER — CIPROFLOXACIN HCL 250 MG PO TABS: 250.0000 mg | ORAL_TABLET | Freq: Two times a day (BID) | ORAL | 0 refills | Status: DC

## 2017-08-12 NOTE — Telephone Encounter (Signed)
Patient cld states was in 8/27 for an UTI & was advised to call back if symptoms ongoing-- Pt states a little improved but still feeling discomfort-- advised provider/MA will call back as soon as available-- Call [phone] to reach pt. --glh

## 2017-08-12 NOTE — Telephone Encounter (Signed)
Afternoon Tonya, Can you please call Ms. Stalter and share that her urine culture revealed that the bacteria causing her sx's is susceptible to Cipro and since she is still having sx's I sent in anther 3 day course. Please tell her to continue to push fluids and call if sx's persist after this round, Thanks! Valetta Fuller

## 2017-08-12 NOTE — Telephone Encounter (Signed)
Please advise.  T. Nelson, CMA 

## 2017-08-12 NOTE — Telephone Encounter (Signed)
Pt informed of results.  Pt expressed understanding and is agreeable.  T. Yecenia Dalgleish, CMA 

## 2017-08-18 ENCOUNTER — Other Ambulatory Visit: Payer: Self-pay | Admitting: Family Medicine

## 2017-08-18 DIAGNOSIS — K219 Gastro-esophageal reflux disease without esophagitis: Secondary | ICD-10-CM

## 2017-08-18 MED ORDER — OMEPRAZOLE 20 MG PO CPDR: 20.0000 mg | DELAYED_RELEASE_CAPSULE | Freq: Every day | ORAL | 4 refills | Status: DC

## 2017-08-18 NOTE — Telephone Encounter (Signed)
°  Pt request Rx refill of Omeprazole DR 20 MG  Capsules (Rx not Originally prescribed by Dr. Raliegh Scarlet )  be sent to [facility] on [address]

## 2017-08-18 NOTE — Telephone Encounter (Signed)
We have not prescribed these medications for the patient previously.  Please review and refill if appropriate.  T. Nelson, CMA  

## 2017-08-23 DIAGNOSIS — R296 Repeated falls: Secondary | ICD-10-CM | POA: Diagnosis not present

## 2017-08-23 DIAGNOSIS — E049 Nontoxic goiter, unspecified: Secondary | ICD-10-CM | POA: Diagnosis not present

## 2017-08-23 DIAGNOSIS — Z13820 Encounter for screening for osteoporosis: Secondary | ICD-10-CM | POA: Diagnosis not present

## 2017-08-23 DIAGNOSIS — E039 Hypothyroidism, unspecified: Secondary | ICD-10-CM | POA: Diagnosis not present

## 2017-08-23 DIAGNOSIS — Z5181 Encounter for therapeutic drug level monitoring: Secondary | ICD-10-CM | POA: Diagnosis not present

## 2017-08-23 DIAGNOSIS — E119 Type 2 diabetes mellitus without complications: Secondary | ICD-10-CM | POA: Diagnosis not present

## 2017-08-23 DIAGNOSIS — R809 Proteinuria, unspecified: Secondary | ICD-10-CM | POA: Diagnosis not present

## 2017-08-23 LAB — HEPATIC FUNCTION PANEL
ALK PHOS: 67 (ref 25–125)
ALT: 14 (ref 7–35)
AST: 18 (ref 13–35)
BILIRUBIN, TOTAL: 0.6

## 2017-08-23 LAB — BASIC METABOLIC PANEL
BUN: 13 (ref 4–21)
Creatinine: 0.7 (ref 0.5–1.1)
Glucose: 114
POTASSIUM: 4.5 (ref 3.4–5.3)
SODIUM: 142 (ref 137–147)

## 2017-08-23 LAB — HEMOGLOBIN A1C: Hemoglobin A1C: 5.4

## 2017-08-23 LAB — MICROALBUMIN, URINE: Microalb, Ur: 2.63

## 2017-09-06 ENCOUNTER — Ambulatory Visit: Payer: Medicare Other | Attending: Internal Medicine | Admitting: Occupational Therapy

## 2017-09-06 ENCOUNTER — Ambulatory Visit: Payer: Medicare Other | Admitting: Physical Therapy

## 2017-09-06 DIAGNOSIS — R278 Other lack of coordination: Secondary | ICD-10-CM | POA: Diagnosis not present

## 2017-09-06 DIAGNOSIS — R2681 Unsteadiness on feet: Secondary | ICD-10-CM

## 2017-09-06 DIAGNOSIS — R2689 Other abnormalities of gait and mobility: Secondary | ICD-10-CM | POA: Diagnosis not present

## 2017-09-06 NOTE — Therapy (Signed)
[facility] [address] [place], [place], [address] Phone: [phone]   Fax:  [phone]  Occupational Therapy Evaluation  Patient Details  Name: Sophia Cox MRN: [id number] Date of Birth: [birth year] Referring Provider: Dr. Delrae Rend  Encounter Date: 09/06/2017      OT End of Session - 09/06/17 1109    Visit Number 1   Number of Visits 4   Authorization Type MCR - G code needed   Authorization - Visit Number 1   Authorization - Number of Visits 10   OT Start Time 4944   OT Stop Time 1100   OT Time Calculation (min) 45 min   Activity Tolerance Patient tolerated treatment well      Past Medical History:  Diagnosis Date  . Arthritis    "left knee" (06/21/2013)  . HQPRFFMB(846.6)    "weekly" (06/21/2013)  . Hypertension   . Hypothyroidism   . Pneumonia [phone]   "quit a few times" (06/21/2013)  . Supraventricular tachycardia ([place])   . Type II diabetes mellitus ([place])     Past Surgical History:  Procedure Laterality Date  . CATARACT EXTRACTION Bilateral   . CHOLECYSTECTOMY  ~ 1989  . SUPRAVENTRICULAR TACHYCARDIA ABLATION  06/21/2013  . SUPRAVENTRICULAR TACHYCARDIA ABLATION N/A 06/21/2013   Procedure: SUPRAVENTRICULAR TACHYCARDIA ABLATION;  Surgeon: Evans Lance, MD;  Location: [facility] CATH LAB;  Service: Cardiovascular;  Laterality: N/A;  . TONSILLECTOMY  ~ 1952  . TUBAL LIGATION  1971    There were no vitals filed for this visit.      Subjective Assessment - 09/06/17 1020    Subjective  I've been falling pretty much all my life   Pertinent History OA, DM   Patient Stated Goals Improve balance   Currently in Pain? No/denies           [facility] OT Assessment - 09/06/17 0001      Assessment   Diagnosis frequent falls   Referring Provider Dr. Delrae Rend   Onset Date --  throughout life   Assessment Pt also has macular degeneration which makes balance and certain self care tasks  difficult and unsafe. Pt also reports walking into doors and walls   Prior Therapy none     Precautions   Precautions Fall     Balance Screen   Has the patient fallen in the past 6 months Yes   How many times? 4   Has the patient had a decrease in activity level because of a fear of falling?  No   Is the patient reluctant to leave their home because of a fear of falling?  No     Home  Environment   Bathroom Shower/Tub Tub/Shower unit;Curtain  with grab bars   Home Equipment None   Additional Comments Pt lives in 2 story home, but lives on 1st floor. 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, Astronomer, turkeys;  Control and instrumentation engineer, Medical laboratory scientific officer     ADL   Eating/Feeding Independent  however max difficulty cutting   Grooming Modified independent  difficulty d/t macular degeneration   Upper Body Bathing Independent   Lower Body Bathing Independent   Upper Body Dressing Independent   Lower Body Dressing Independent   Toilet Transfer Independent   Toileting - Clothing Manipulation Independent  Toileting -  Development worker, community Modified independent  with grab bars, and pt very careful     IADL   Shopping Assistance for transportation  Pt drives short distances sometimes, but mostly relies on family   Light Housekeeping --  Pt performs independently   Meal Prep Plans, prepares and serves adequate meals independently  pt reports cutting herself at times, husband assist w/ cutting/draining heavy pots   Education officer, environmental on family or friends for transportation  mostly   Medication Management Is responsible for taking medication in correct dosages at correct time   Physiological scientist financial matters independently (budgets, writes checks, pays rent, bills goes to bank), collects and keeps track of income      Mobility   Mobility Status History of falls     Written Expression   Dominant Hand Right   Handwriting 90% legible     Vision - History   Visual History Macular degeneration  cataract surgery both eyes which made near vision worse   Additional Comments Pt reports far vision better since cataract surgery, but near vision worse. 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 Pt reports she has always had difficulty with her balance, and eye hand coordination      Sensation   Additional Comments Denies change in UE's. Pt does report mild neuropathy in feet/toes     Coordination   Gross Motor Movements are Fluid and Coordinated No   Finger Nose Finger Test appears intact   9 Hole Peg Test Right;Left   Right 9 Hole Peg Test 29.83 sec (2 drops d/t coordination, not vision)    Left 9 Hole Peg Test 26.93 sec     Edema   Edema none     ROM / Strength   AROM / PROM / Strength AROM;Strength     AROM   Overall AROM Comments BUE AROM WNL's     Strength   Overall Strength Comments BUE MMT grossly 5/5     Hand Function   Right Hand Grip (lbs) 57 lbs   Left Hand Grip (lbs) 52 lbs                         OT Education - 09/06/17 1115    Education provided Yes   Education Details safety recommendations for draining pots, discussion re: potential A/E needs for low vision and safety with cutting   Person(s) Educated Patient   Methods Explanation   Comprehension Verbalized understanding             OT Long Term Goals - 09/06/17 1116      OT LONG TERM GOAL #1   Title Independent with coordination HEP    Time 4   Period Weeks   Status New     OT LONG TERM GOAL #2   Title Pt to verbalize understanding and demo use of tub transfer bench and other fall prevention strategies   Time 4   Period Weeks   Status New     OT LONG TERM GOAL #3   Title Pt to  verbalize understanding with potential A/E needs, how to acquire and proper use with cooking/canning tasks and for low vision for safety and greater independence/ease   Time 4   Period Weeks   Status New  Plan - 09/06/17 1110    Clinical Impression Statement Pt is a 71 y.o. female who presents to outpatient rehab with frequent falls and referred for fall prevention. Pt also has macular degeneration and mild coordination deficits which make certain self care tasks difficult and unsafe. Pt with difficulty cutting vegetables, meat; draining heavy pots, and safety concerns with this as well as shower transfers.    Occupational Profile and client history currently impacting functional performance PMH: Macular degeneration, OA, DM. See above for specific tasks that are limited/unsafe   Occupational performance deficits (Please refer to evaluation for details): IADL's;Leisure;Social Participation   Rehab Potential Good   OT Frequency 1x / week   OT Duration 4 weeks  anticipate only 2 visits needed   OT Treatment/Interventions Self-care/ADL training;Patient/family education;Neuromuscular education;Functional Mobility Training;Visual/perceptual remediation/compensation   Plan coordination HEP, practice simulating tub transfer with bench and issue handout. If time, begin education and potential A/E for cutting (cut resistant and burn resistant gloves), low vision magnifiers, etc.    Clinical Decision Making Several treatment options, min-mod task modification necessary   Consulted and Agree with Plan of Care Patient      Patient will benefit from skilled therapeutic intervention in order to improve the following deficits and impairments:  Decreased coordination, Decreased safety awareness, Impaired UE functional use, Impaired vision/preception  Visit Diagnosis: Unsteadiness on feet - Plan: Ot plan of care cert/re-cert  Other lack of coordination - Plan: Ot plan of care  cert/re-cert      G-Codes - 46/56/81 1118    Functional Assessment Tool Used (Outpatient only) Safety and occasional assist with IADL tasks   Functional Limitation Self care   Self Care Current Status [phone]) At least 20 percent but less than 40 percent impaired, limited or restricted   Self Care Goal Status ([id number]) At least 1 percent but less than 20 percent impaired, limited or restricted      Problem List Patient Active Problem List   Diagnosis Date Noted  . Abnormal urinalysis 08/09/2017  . Urinary urgency 08/09/2017  . Urinary frequency 08/09/2017  . Proteinuria 04/14/2017  . Hypertriglyceridemia 04/14/2017  . Controlled diabetes mellitus with microalbuminuria, without long-term current use of insulin ([place]) 09/10/2016  . Patient's noncompliance with other medical treatment and regimen- declines ARB or chol meds 09/10/2016  . Allergic rhinitis 08/13/2016  . Hyperlipidemia 02/01/2016  . GERD (gastroesophageal reflux disease) 02/01/2016  . Obesity 02/01/2016  . Esophageal stricture 02/01/2016  . Alternating constipation and diarrhea 05/16/2014  . Preventative health care 12/04/2013  . Hypothyroidism 05/18/2013  . SVT (supraventricular tachycardia) ([place]) 04/26/2013  . Type 2 diabetes mellitus with peripheral neuropathy ([place]) 12/14/2000    Carey Bullocks, OTR/L 09/06/2017, [facility] [address] [place], [place], [address] Phone: [phone]   Fax:  [phone]  Name: Sophia Cox MRN: [id number] Date of Birth: [birth year]

## 2017-09-06 NOTE — Therapy (Signed)
[facility] [address] [place], [place], [address] Phone: [phone]   Fax:  [phone]  Physical Therapy Treatment  Patient Details  Name: Sophia Cox MRN: [id number] Date of Birth: 1946-06-07 Referring Provider: Dr. Delrae Rend  Encounter Date: 09/06/2017      PT End of Session - 09/06/17 1947    Visit Number 1   Number of Visits 10   Date for PT Re-Evaluation 11/05/17   Authorization Type Medicare traditional, BCBS secondary-GCODE every 10th visit   PT Start Time 1104   PT Stop Time 1145   PT Time Calculation (min) 41 min   Activity Tolerance Patient tolerated treatment well   Behavior During Therapy [facility] for tasks assessed/performed      Past Medical History:  Diagnosis Date  . Arthritis    "left knee" (06/21/2013)  . RWERXVQM(086.7)    "weekly" (06/21/2013)  . Hypertension   . Hypothyroidism   . Pneumonia [phone]   "quit a few times" (06/21/2013)  . Supraventricular tachycardia ([place])   . Type II diabetes mellitus ([place])     Past Surgical History:  Procedure Laterality Date  . CATARACT EXTRACTION Bilateral   . CHOLECYSTECTOMY  ~ 1989  . SUPRAVENTRICULAR TACHYCARDIA ABLATION  06/21/2013  . SUPRAVENTRICULAR TACHYCARDIA ABLATION N/A 06/21/2013   Procedure: SUPRAVENTRICULAR TACHYCARDIA ABLATION;  Surgeon: Evans Lance, MD;  Location: [facility] CATH LAB;  Service: Cardiovascular;  Laterality: N/A;  . TONSILLECTOMY  ~ 1952  . TUBAL LIGATION  1971    There were no vitals filed for this visit.      Subjective Assessment - 09/06/17 1108    Subjective Pt reports fell in a tub in June, while travelling, then had another fall in basement (falling flat out and hard).  Had pain for quite some time in low back, but it is pretty much gone.  Reports 3-4 falls in the past 6 months.  She reports falling in the yard alot-on uneven surfaces.  Does not use assistive device.   Pertinent History Macular  degeneration, history of falls, ?compression fractures lumbar spine, DM with neuropathy   Patient Stated Goals Pt's goal for physical therapy are to help improve balance and prevent falls   Currently in Pain? No/denies            [facility] PT Assessment - 09/06/17 1114      Assessment   Medical Diagnosis frequent falls   Referring Provider Dr. Delrae Rend   Onset Date/Surgical Date --  June 2018 fall resulting in ED visit     Precautions   Precautions Fall     Balance Screen   Has the patient fallen in the past 6 months Yes   How many times? 3-4   Has the patient had a decrease in activity level because of a fear of falling?  No   Is the patient reluctant to leave their home because of a fear of falling?  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, Astronomer, turkeys; Control and instrumentation engineer, Medical laboratory scientific officer; has treadmill at home that she uses 5x/week     Observation/Other Assessments   Focus on Therapeutic Outcomes (FOTO)  63.1% ABC score; FOTO intake score 97 (adjusted to 64)     Strength   Overall Strength Comments Grossly tested bilateral lower extremities 5/5     Transfers   Transfers Sit to Stand;Stand to Sit   Sit to Stand 6: Modified independent (Device/Increase time);With upper extremity assist;From chair/3-in-1   Stand to Sit 6: Modified independent (Device/Increase time);With upper extremity assist;To chair/3-in-1     Ambulation/Gait   Ambulation/Gait Yes   Ambulation/Gait Assistance 6: Modified independent  (Device/Increase time)   Ambulation Distance (Feet) 250 Feet   Assistive device None   Gait Pattern Step-through pattern;Trendelenburg;Narrow base of support;Scissoring   Ambulation Surface Level;Indoor   Gait velocity 11.47 sec = 2.86 ft/sec     Standardized Balance Assessment   Standardized Balance Assessment Dynamic Gait Index;Timed Up and Go Test     Dynamic Gait Index   Level Surface Normal   Change in Gait Speed Mild Impairment   Gait with Horizontal Head Turns Mild Impairment   Gait with Vertical Head Turns Mild Impairment   Gait and Pivot Turn Normal   Step Over Obstacle Moderate Impairment   Step Around Obstacles Mild Impairment   Steps Moderate Impairment   Total Score 16   DGI comment: Scores <19/24 indicate increased fall risk.     Timed Up and Go Test   Normal TUG (seconds) 10.82   Manual TUG (seconds) 11.59   Cognitive TUG (seconds) 12.94   TUG Comments >10% difference in TUG and TUG cognitive indicates difficulty with dual tasking with gait.     High Level Balance   High Level Balance Comments Pt stands EO/EC on solid surface for 30 seconds.  Stands on foam EO for 30 seconds, EC for 30 sec, with increased sway last 15 seconds.  (possible indication of decreased vestibular system use for balance)                             PT Education - 09/06/17 1946    Education provided Yes   Education Details Discussed objective measures from eval, in relation to falls risk.  Discussed 3 sensory systems for balance and relation to real life activities where pateint is having difficulty; POC to address fall risk    Person(s) Educated Patient   Methods Explanation   Comprehension Verbalized understanding             PT Long Term Goals - 09/06/17 1955      PT LONG TERM GOAL #1   Title Pt will be independent with HEP for improved balance and gait.  TARGET 10/08/17   Time 5   Period Weeks   Status New   Target Date 10/08/17     PT LONG TERM  GOAL #2   Title Pt will imrpove DGI score to at least 19/24 for decreased fall risk.   Time 5   Period Weeks   Status New   Target Date 10/08/17     PT LONG TERM GOAL #3   Title Pt will improve ABC score by at least 10% for improved balance confidence.   Time 5   Period Weeks   Status New   Target Date 10/08/17     PT LONG TERM GOAL #4   Title Sensory Organization test  to be performed, with goal written as appropriate.   Time 5   Period Weeks   Status New   Target Date 10/08/17     PT LONG TERM GOAL #5   Title Pt will verbalize understanding of fall prevention in the home environment.   Time 5   Period Weeks   Status New   Target Date 10/08/17               Plan - 03-Oct-2017 1950    Clinical Impression Statement Pt is a 71 year old female who presents to OP PT with history of at least 3-4 falls in the past 6 months to a year.  She presents with decreased balance, possible decreased vestibular system use for balance, decreased gait independence, increased fear of falling.  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  Pt will benefit from skilled PT to address the above stated deficits and to decrease fall risk/improve functional mobility.   History and Personal Factors relevant to plan of care: fall risk per DGI, fear of falling per ABC scale score; 3-4 falls at least, in past 6 months   Clinical Presentation Stable   Clinical Presentation due to: hx of falls, >3 co-morbidities   Clinical Decision Making Low   Rehab Potential Good   PT Frequency 2x / week   PT Duration Other (comment)  for 5 weeks    PT Treatment/Interventions ADLs/Self Care Home Management;Functional mobility training;Gait training;DME Instruction;Therapeutic activities;Therapeutic exercise;Balance training;Neuromuscular re-education;Stair training;Patient/family education   PT Next Visit Plan Sensory Organization test (goal written as  appropriate); initiate HEP to address balance deficits   Consulted and Agree with Plan of Care Patient      Patient will benefit from skilled therapeutic intervention in order to improve the following deficits and impairments:  Abnormal gait, Decreased mobility, Difficulty walking, Decreased balance  Visit Diagnosis: Other abnormalities of gait and mobility  Unsteadiness on feet       G-Codes - 10-03-17 1958    Functional Assessment Tool Used (Outpatient Only) ABC score 63.1%; DGI 16/24, TUG 10.82, TUG cog 12.94 sec, 3-4 falls in the past 6 months   Functional Limitation Mobility: Walking and moving around   Mobility: Walking and Moving Around Current Status [phone]) At least 20 percent but less than 40 percent impaired, limited or restricted   Mobility: Walking and Moving Around Goal Status [phone]) At least 1 percent but less than 20 percent impaired, limited or restricted      Problem List Patient Active Problem List   Diagnosis Date Noted  . Abnormal urinalysis 08/09/2017  . Urinary urgency 08/09/2017  . Urinary frequency 08/09/2017  . Proteinuria 04/14/2017  . Hypertriglyceridemia 04/14/2017  . Controlled diabetes mellitus with microalbuminuria, without long-term current use of insulin ([place]) 09/10/2016  . Patient's noncompliance with other medical treatment and regimen- declines ARB or chol meds 09/10/2016  . Allergic rhinitis 08/13/2016  . Hyperlipidemia 02/01/2016  . GERD (gastroesophageal reflux disease) 02/01/2016  . Obesity 02/01/2016  . Esophageal stricture 02/01/2016  . Alternating constipation and diarrhea 05/16/2014  . Preventative health care 12/04/2013  . Hypothyroidism 05/18/2013  . SVT (supraventricular tachycardia) ([place]) 04/26/2013  . Type 2 diabetes mellitus with peripheral neuropathy ([place]) 12/14/2000    Louise Rawson W. Oct 03, 2017, 8:00 PM  Frazier Butt., PT   [facility] [address] [place], [place], [address] Phone: [phone]   Fax:  [phone]  Name: Sophia Cox MRN: [id number] Date of Birth: 04-01-1946

## 2017-09-09 ENCOUNTER — Ambulatory Visit: Payer: Medicare Other | Admitting: Physical Therapy

## 2017-09-09 DIAGNOSIS — R2681 Unsteadiness on feet: Secondary | ICD-10-CM

## 2017-09-09 DIAGNOSIS — R278 Other lack of coordination: Secondary | ICD-10-CM | POA: Diagnosis not present

## 2017-09-09 DIAGNOSIS — R2689 Other abnormalities of gait and mobility: Secondary | ICD-10-CM | POA: Diagnosis not present

## 2017-09-09 NOTE — Patient Instructions (Addendum)
Feet Apart (Compliant Surface) Head Motion - Eyes Open    With eyes open, standing on compliant surface: __pillow or towel______, feet shoulder width apart, move head slowly: up and down. Repeat __10__ times per session. Then head turns side to side, 10 times.  Do __1-2__ sessions per day.  Copyright  VHI. All rights reserved.  Feet Apart (Compliant Surface) Head Motion - Eyes Closed    Stand on compliant surface: ___pillow or towel_____ with feet shoulder width apart. Close eyes and move head slowly, up and down. Repeat _10___ times per session. Head motions side to side, 10 times.  Do __1-2__ sessions per day.  Copyright  VHI. All rights reserved.  Toe / Heel Raise    Gently rock back on heels and raise toes. Then rock forward on toes and raise heels. Repeat sequence _10___ times per session. Do _1-2___ sessions per day.  Copyright  VHI. All rights reserved.  Weight Shift: Anterior / Posterior (Righting / Equilibrium)    Slowly shift weight forward, arms back and hips forward over toes. Return to starting position. Shift weight backward, arms forward and hips back over heels. Hold each position _3___ seconds.  "Wall bumps" Repeat _10___ times per session. Do __1-2__ sessions per day.  Copyright  VHI. All rights reserved.

## 2017-09-10 NOTE — Therapy (Signed)
[facility] [address] [place], [place], [address] Phone: [phone]   Fax:  [phone]  Physical Therapy Treatment  Patient Details  Name: Sophia Cox MRN: [id number] Date of Birth: 12/08/1946 Referring Provider: Dr. Delrae Rend  Encounter Date: 09/09/2017      PT End of Session - 09/10/17 0534    Visit Number 2   Number of Visits 10   Date for PT Re-Evaluation 11/05/17   Authorization Type Medicare traditional, BCBS secondary-GCODE every 10th visit   PT Start Time 0805   PT Stop Time 0846   PT Time Calculation (min) 41 min   Activity Tolerance Patient tolerated treatment well   Behavior During Therapy [facility] for tasks assessed/performed      Past Medical History:  Diagnosis Date  . Arthritis    "left knee" (06/21/2013)  . GYIRSWNI(627.0)    "weekly" (06/21/2013)  . Hypertension   . Hypothyroidism   . Pneumonia [phone]   "quit a few times" (06/21/2013)  . Supraventricular tachycardia ([place])   . Type II diabetes mellitus ([place])     Past Surgical History:  Procedure Laterality Date  . CATARACT EXTRACTION Bilateral   . CHOLECYSTECTOMY  ~ 1989  . SUPRAVENTRICULAR TACHYCARDIA ABLATION  06/21/2013  . SUPRAVENTRICULAR TACHYCARDIA ABLATION N/A 06/21/2013   Procedure: SUPRAVENTRICULAR TACHYCARDIA ABLATION;  Surgeon: Evans Lance, MD;  Location: [facility] CATH LAB;  Service: Cardiovascular;  Laterality: N/A;  . TONSILLECTOMY  ~ 1952  . TUBAL LIGATION  1971    There were no vitals filed for this visit.      Subjective Assessment - 09/09/17 0805    Subjective No changes, no falls since last visit.   Pertinent History Macular degeneration, history of falls, ?compression fractures lumbar spine, DM with neuropathy   Patient Stated Goals Pt's goal for physical therapy are to help improve balance and prevent falls   Currently in Pain? No/denies           Neuro Re-education Sensory Organization test  results: Conditions 1, 2, 4, 5, 6:  WNL for all 3 conditions (increased sway, near reaching out for wall noted in condition 5.) Condition 3:  Trial 1 is below normal limits  Composite Score:  79 (WNL)  Sensory Analysis:  Somatosensory, Vision, Vestibular System WNL  Strategy Analysis:  WNL for appropriate use of hip/ankle strategy, except more hip dominant on Conidtion 5.  Center of Gravity Alignment:  weightshifted to the Left (pt does report having leg length difference with R being shorter)               [facility] Adult PT Treatment/Exercise - 09/10/17 0001      Self-Care   Self-Care Other Self-Care Comments   Other Self-Care Comments  Explained results of Sensory Organization test; discussed situations where PT noted pt to have increased difficulty or increased sway on test (Conditions 3, conditions 5) and how those situations relate to real world scenarios.  Discussed that based on pt's history and mechanisms of falls outdoors, without being able to stop her falls, that we will begin to address this through vestibular-type retraining exercises for balance.  Pt verbalizes understanding.             Balance Exercises - 09/10/17 0531      Balance Exercises: Standing   Standing Eyes Opened Wide (BOA);Narrow base of support (BOS);Foam/compliant surface;Head turns;Solid surface  Head nods, x 10 reps each direction   Standing Eyes Closed Wide (BOA);Narrow base of support (BOS);Foam/compliant  surface;Head turns;Solid surface  Head nods, 5 reps each   Wall Bumps Hip   Wall Bumps-Hips Eyes opened;Anterior/posterior;10 reps  For hip strategy work   Heel Raises Limitations x 10 reps   Toe Raise Limitations x 10 reps  for ankle strategy work           PT Education - 09/10/17 0533    Education provided Yes   Education Details HEP Initiated for balance, explanation of results from Sensory Organization test   Person(s) Educated Patient   Methods  Explanation;Demonstration;Handout   Comprehension Verbalized understanding;Returned demonstration             PT Long Term Goals - 09/10/17 0539      PT LONG TERM GOAL #1   Title Pt will be independent with HEP for improved balance and gait.  TARGET 10/08/17   Time 5   Period Weeks   Status New     PT LONG TERM GOAL #2   Title Pt will imrpove DGI score to at least 19/24 for decreased fall risk.   Time 5   Period Weeks   Status New     PT LONG TERM GOAL #3   Title Pt will improve ABC score by at least 10% for improved balance confidence.   Time 5   Period Weeks   Status New     PT LONG TERM GOAL #4   Title Sensory Organization test to be performed, with goal written as appropriate.   Baseline Composite score WNL; Sensory Analysis WNL-No goal needed.   Time 5   Period Weeks   Status Deferred     PT LONG TERM GOAL #5   Title Pt will verbalize understanding of fall prevention in the home environment.   Time 5   Period Weeks   Status New               Plan - 09/10/17 0534    Clinical Impression Statement Sensory Organization test performed this visit, with pt scoring WNL for composite balance and for balance systems.  However, pt does have increased sway trying to maintain balance on several conditions.  Due to pt's number of falls, mechanisms of falls based on her description, and falling without being able to stop herself, PT will proceed to address balance through vestibular systerm-like balance retraining on varied surfaces and balance strategy work.  Pt is noted to have posterior LOB several times during compliant surface work with head nods, needing UE support to regain balance.   Rehab Potential Good   PT Frequency 2x / week   PT Duration Other (comment)  for 5 weeks    PT Treatment/Interventions ADLs/Self Care Home Management;Functional mobility training;Gait training;DME Instruction;Therapeutic activities;Therapeutic exercise;Balance  training;Neuromuscular re-education;Stair training;Patient/family education   PT Next Visit Plan Review HEP and progress corner balance exercises, rockerboard, step strategy work   [employer] and Agree with Plan of Care Patient      Patient will benefit from skilled therapeutic intervention in order to improve the following deficits and impairments:  Abnormal gait, Decreased mobility, Difficulty walking, Decreased balance  Visit Diagnosis: Unsteadiness on feet     Problem List Patient Active Problem List   Diagnosis Date Noted  . Abnormal urinalysis 08/09/2017  . Urinary urgency 08/09/2017  . Urinary frequency 08/09/2017  . Proteinuria 04/14/2017  . Hypertriglyceridemia 04/14/2017  . Controlled diabetes mellitus with microalbuminuria, without long-term current use of insulin ([place]) 09/10/2016  . Patient's noncompliance with other medical treatment and regimen- declines ARB  or chol meds 09/10/2016  . Allergic rhinitis 08/13/2016  . Hyperlipidemia 02/01/2016  . GERD (gastroesophageal reflux disease) 02/01/2016  . Obesity 02/01/2016  . Esophageal stricture 02/01/2016  . Alternating constipation and diarrhea 05/16/2014  . Preventative health care 12/04/2013  . Hypothyroidism 05/18/2013  . SVT (supraventricular tachycardia) ([place]) 04/26/2013  . Type 2 diabetes mellitus with peripheral neuropathy ([place]) 12/14/2000    Tanita Palinkas W. 09/10/2017, 5:40 AM  Frazier Butt., PT   [facility] [address] [place], [place], [address] Phone: [phone]   Fax:  [phone]  Name: Sophia Cox MRN: [id number] Date of Birth: 1946/08/31

## 2017-09-13 ENCOUNTER — Ambulatory Visit: Payer: Medicare Other | Attending: Internal Medicine | Admitting: Physical Therapy

## 2017-09-13 ENCOUNTER — Encounter: Payer: Self-pay | Admitting: Physical Therapy

## 2017-09-13 DIAGNOSIS — R2689 Other abnormalities of gait and mobility: Secondary | ICD-10-CM | POA: Insufficient documentation

## 2017-09-13 DIAGNOSIS — R2681 Unsteadiness on feet: Secondary | ICD-10-CM | POA: Insufficient documentation

## 2017-09-13 NOTE — Patient Instructions (Signed)
Feet Together (Compliant Surface) Head Motion - Eyes Open      With eyes open, standing on compliant surface: __pillow or towel______, feet together, move head slowly: up and down. Repeat __10__ times per session. Then head turns side to side, 10 times.  Do __1-2__ sessions per day.    Copyright  VHI. All rights reserved.  Feet Apart (Compliant Surface) Head Motion - Eyes Closed    Stand on compliant surface: ___pillow or towel_____ with feet shoulder width apart. Close eyes and move head slowly, up and down. Repeat _10___ times per session. Head motions side to side, 10 times.  Do __1-2__ sessions per day.  Copyright  VHI. All rights reserved.  Toe / Heel Raise    Be near the counter and place hands (or two fingers on each hand) on surface. Gently rock back on heels and raise toes. Then rock forward on toes and raise heels and hold 3 seconds. Repeat sequence _10___ times per session. Do _1-2___ sessions per day.  Copyright  VHI. All rights reserved.  Weight Shift: Anterior / Posterior (Righting / Equilibrium)    Slowly shift weight forward, arms back and hips forward over toes, lift heels. Return to starting position. Shift weight backward, arms forward and hips back over heels, lift toes. Hold each position _3___ seconds.  "Wall bumps" Repeat _10___ times per session. Do __1-2__ sessions per day.

## 2017-09-13 NOTE — Therapy (Signed)
[facility] [address] [place], [place], [address] Phone: [phone]   Fax:  [phone]  Physical Therapy Treatment  Patient Details  Name: Sophia Cox MRN: [id number] Date of Birth: 13-Feb-1946 Referring Provider: Dr. Delrae Rend  Encounter Date: 09/13/2017      PT End of Session - 09/13/17 0901    Visit Number 3   Number of Visits 10   Date for PT Re-Evaluation 11/05/17   Authorization Type Medicare traditional, BCBS secondary-GCODE every 10th visit   PT Start Time 0800   PT Stop Time 0843   PT Time Calculation (min) 43 min   Activity Tolerance Patient tolerated treatment well   Behavior During Therapy [facility] for tasks assessed/performed      Past Medical History:  Diagnosis Date  . Arthritis    "left knee" (06/21/2013)  . HKVQQVZD(638.7)    "weekly" (06/21/2013)  . Hypertension   . Hypothyroidism   . Pneumonia [phone]   "quit a few times" (06/21/2013)  . Supraventricular tachycardia ([place])   . Type II diabetes mellitus ([place])     Past Surgical History:  Procedure Laterality Date  . CATARACT EXTRACTION Bilateral   . CHOLECYSTECTOMY  ~ 1989  . SUPRAVENTRICULAR TACHYCARDIA ABLATION  06/21/2013  . SUPRAVENTRICULAR TACHYCARDIA ABLATION N/A 06/21/2013   Procedure: SUPRAVENTRICULAR TACHYCARDIA ABLATION;  Surgeon: Evans Lance, MD;  Location: [facility] CATH LAB;  Service: Cardiovascular;  Laterality: N/A;  . TONSILLECTOMY  ~ 1952  . TUBAL LIGATION  1971    There were no vitals filed for this visit.      Subjective Assessment - 09/13/17 0801    Subjective Doing OK. I was doing the exercises and they were easy until the last one.    Pertinent History Macular degeneration, history of falls, ?compression fractures lumbar spine, DM with neuropathy   Patient Stated Goals Pt's goal for physical therapy are to help improve balance and prevent falls   Currently in Pain? No/denies        Treatment  Neuromuscular Re-ed  Reviewed HEP issued last visit:  **Note: pt immediately held onto chair in front of her and began exercise. Educated on role of somatosensory, vision, and vestibular system and why she should not hold chair. Also, noted during EO and vertical head movement quick bursts of vertical nystagmus.  Balance on compliant surface--EO feet shoulder width, head turns horizontal x 10 and vertical x 10 (essentially no increased sway, therefore increased the challenge); progressed to feet together head turns horizontal x 10 and vertical x 10 (incr sway with pt touching walls laterally ~2 times and chair in front 2 times and then showed improved ability to self-correct)  EC-- feet shoulder width, head turns horizontal x 10 and vertical x 10 (appropriate challenge with pt incr sway)  Toe and heel raises at counter- vc to lightly touch counter with fingertips; added 3 second hold with heel raise  "Wall bumps" (see handout)- pt required max cues and breaking exercise down into parts to correctly sequence. Ultimately pt able to correctly perform. Instructed to do with wall behind her and chair in front.   Stepping strategy in // bars with intermittent UE support- anteriorly and posteriorly x 10 reps each with alternating legs. Attempting to take single large step to catch her balance after she leans beyond her BOS and loses her balance.   Rockerboard-- no UE support; EO and EC x 30 seconds; head turns horizontally and vertically x 10 each (incr sway and required //  bars to recover at times, especially with EC and vertical head movements.                                PT Long Term Goals - 09/10/17 0539      PT LONG TERM GOAL #1   Title Pt will be independent with HEP for improved balance and gait.  TARGET 10/08/17   Time 5   Period Weeks   Status New     PT LONG TERM GOAL #2   Title Pt will imrpove DGI score to at least 19/24 for decreased  fall risk.   Time 5   Period Weeks   Status New     PT LONG TERM GOAL #3   Title Pt will improve ABC score by at least 10% for improved balance confidence.   Time 5   Period Weeks   Status New     PT LONG TERM GOAL #4   Title Sensory Organization test to be performed, with goal written as appropriate.   Baseline Composite score WNL; Sensory Analysis WNL-No goal needed.   Time 5   Period Weeks   Status Deferred     PT LONG TERM GOAL #5   Title Pt will verbalize understanding of fall prevention in the home environment.   Time 5   Period Weeks   Status New               Plan - 09/13/17 0857    Clinical Impression Statement Session focused on re-educating and upgrading HEP given at last visit. Patient required re-explanation of balance systems and how they interact and this seemed to help her understand rationale for exercises. (She had made them "easy" by holding on to chair with both hands). Additional work on Hotel manager completed.  Patient asking good questions and very motivated to improve. Will continue to benefit from skilled PT.    Rehab Potential Good   PT Frequency 2x / week   PT Duration Other (comment)  for 5 weeks    PT Treatment/Interventions ADLs/Self Care Home Management;Functional mobility training;Gait training;DME Instruction;Therapeutic activities;Therapeutic exercise;Balance training;Neuromuscular re-education;Stair training;Patient/family education   PT Next Visit Plan See if any lingering ?s from HEP. activities on compliant surfaces and eyes closed;  progress corner balance exercises, rockerboard, step strategy work   Oncologist with Plan of Care Patient      Patient will benefit from skilled therapeutic intervention in order to improve the following deficits and impairments:  Abnormal gait, Decreased mobility, Difficulty walking, Decreased balance  Visit Diagnosis: Unsteadiness on feet     Problem List Patient Active Problem  List   Diagnosis Date Noted  . Abnormal urinalysis 08/09/2017  . Urinary urgency 08/09/2017  . Urinary frequency 08/09/2017  . Proteinuria 04/14/2017  . Hypertriglyceridemia 04/14/2017  . Controlled diabetes mellitus with microalbuminuria, without long-term current use of insulin ([place]) 09/10/2016  . Patient's noncompliance with other medical treatment and regimen- declines ARB or chol meds 09/10/2016  . Allergic rhinitis 08/13/2016  . Hyperlipidemia 02/01/2016  . GERD (gastroesophageal reflux disease) 02/01/2016  . Obesity 02/01/2016  . Esophageal stricture 02/01/2016  . Alternating constipation and diarrhea 05/16/2014  . Preventative health care 12/04/2013  . Hypothyroidism 05/18/2013  . SVT (supraventricular tachycardia) ([place]) 04/26/2013  . Type 2 diabetes mellitus with peripheral neuropathy ([place]) 12/14/2000    Rexanne Mano, PT 09/13/2017, 9:03 AM  [facility]  [address] [place], [place], [address] Phone: [phone]   Fax:  [phone]  Name: Sophia Cox MRN: [id number] Date of Birth: [birth year]

## 2017-09-16 ENCOUNTER — Ambulatory Visit: Payer: Medicare Other | Admitting: Physical Therapy

## 2017-09-16 DIAGNOSIS — R2681 Unsteadiness on feet: Secondary | ICD-10-CM

## 2017-09-16 DIAGNOSIS — R2689 Other abnormalities of gait and mobility: Secondary | ICD-10-CM

## 2017-09-16 NOTE — Therapy (Signed)
[facility] [address] [place], [place], [address] Phone: [phone]   Fax:  [phone]  Physical Therapy Treatment  Patient Details  Name: Sophia Cox MRN: [id number] Date of Birth: October 24, 1946 Referring Provider: Dr. Delrae Rend  Encounter Date: 09/16/2017      PT End of Session - 09/16/17 1215    Visit Number 4   Number of Visits 10   Date for PT Re-Evaluation 11/05/17   Authorization Type Medicare traditional, BCBS secondary-GCODE every 10th visit   PT Start Time 0804   PT Stop Time 0843   PT Time Calculation (min) 39 min   Activity Tolerance Patient tolerated treatment well   Behavior During Therapy [facility] for tasks assessed/performed      Past Medical History:  Diagnosis Date  . Arthritis    "left knee" (06/21/2013)  . JJOACZYS(063.0)    "weekly" (06/21/2013)  . Hypertension   . Hypothyroidism   . Pneumonia [phone]   "quit a few times" (06/21/2013)  . Supraventricular tachycardia ([place])   . Type II diabetes mellitus ([place])     Past Surgical History:  Procedure Laterality Date  . CATARACT EXTRACTION Bilateral   . CHOLECYSTECTOMY  ~ 1989  . SUPRAVENTRICULAR TACHYCARDIA ABLATION  06/21/2013  . SUPRAVENTRICULAR TACHYCARDIA ABLATION N/A 06/21/2013   Procedure: SUPRAVENTRICULAR TACHYCARDIA ABLATION;  Surgeon: Evans Lance, MD;  Location: [facility] CATH LAB;  Service: Cardiovascular;  Laterality: N/A;  . TONSILLECTOMY  ~ 1952  . TUBAL LIGATION  1971    There were no vitals filed for this visit.      Subjective Assessment - 09/16/17 0807    Subjective No falls, no changes.  Have a hard time with wall bumps.   Pertinent History Macular degeneration, history of falls, ?compression fractures lumbar spine, DM with neuropathy   Patient Stated Goals Pt's goal for physical therapy are to help improve balance and prevent falls   Currently in Pain? No/denies                              Balance Exercises - 09/16/17 0808      Balance Exercises: Standing   Standing Eyes Opened Narrow base of support (BOS);Foam/compliant surface;Head turns  10 reps and head nods-in corner review of HEP   Standing Eyes Closed Wide (BOA);Foam/compliant surface;Head turns  10 reps and head nods in corner, review of HEP   Wall Bumps Hip   Wall Bumps-Hips Eyes opened;Anterior/posterior;10 reps  Cues for "hinge" at hips for posterior excursion   Rockerboard Anterior/posterior;Head turns;EO  Head nods, UE movements/arm swing, hip/ankle strategy work   Engineer, maintenance (IT) in place x 10 reps, then forward kicks x 10 reps, back kicks x 10 reps; forward/back step taps x 10 reps with UE support   Sidestepping Foam/compliant support;Upper extremity support;5 reps  along balance beam   Heel Raises Limitations x 10 reps   Toe Raise Limitations x 10 reps   Other Standing Exercises ON pillows in corner:  marching in place x 10, alternating forward kicks x 10 reps, then alternating forward step taps x 10 reps.  Standing on ramp incline/decline compliant blue mat surface:  feet apart, feet together EC with head turns x 10, head nods x 10 each;  marching in place on incline and on decline x 10 reps.  Sidestepping up and down ramp x 3 reps each foot leading    Reviewed HEP, with pt return demo understanding  of HEP.  Pt does need additional cues on proper technique of optimally performing hip strategy/wall bumps.  Educated patient on isolating hip strategy by keeping feet flat on ground and allowing hip excursion in posterior and anterior direction.  Added instructions to re-print of this exercise for HEP.       PT Education - 09/16/17 1215    Education provided Yes   Education Details REviewed HEP and provided additional written instructions to wall bumps, to keep feet flat on floor and hinge at hips to touch wall.   Person(s) Educated Patient   Methods Explanation;Handout;Demonstration   Comprehension  Verbalized understanding;Returned demonstration;Verbal cues required             PT Long Term Goals - 09/10/17 0539      PT LONG TERM GOAL #1   Title Pt will be independent with HEP for improved balance and gait.  TARGET 10/08/17   Time 5   Period Weeks   Status New     PT LONG TERM GOAL #2   Title Pt will imrpove DGI score to at least 19/24 for decreased fall risk.   Time 5   Period Weeks   Status New     PT LONG TERM GOAL #3   Title Pt will improve ABC score by at least 10% for improved balance confidence.   Time 5   Period Weeks   Status New     PT LONG TERM GOAL #4   Title Sensory Organization test to be performed, with goal written as appropriate.   Baseline Composite score WNL; Sensory Analysis WNL-No goal needed.   Time 5   Period Weeks   Status Deferred     PT LONG TERM GOAL #5   Title Pt will verbalize understanding of fall prevention in the home environment.   Time 5   Period Weeks   Status New               Plan - 09/16/17 1216    Clinical Impression Statement Continued to work on various balance exercises to address hip and ankle strategy work, compliant surface work and use of vestibular systems for balance.  Pt will continue to benefit from skilled PT to further address balance and dynamic gait.   Rehab Potential Good   PT Frequency 2x / week   PT Duration Other (comment)  for 5 weeks    PT Treatment/Interventions ADLs/Self Care Home Management;Functional mobility training;Gait training;DME Instruction;Therapeutic activities;Therapeutic exercise;Balance training;Neuromuscular re-education;Stair training;Patient/family education   PT Next Visit Plan Review hip and ankle strategy exercises; conitnue activities on compliant surfaces, step strategy work and dynamic compliant surface activities   Consulted and Agree with Plan of Care Patient      Patient will benefit from skilled therapeutic intervention in order to improve the following  deficits and impairments:  Abnormal gait, Decreased mobility, Difficulty walking, Decreased balance  Visit Diagnosis: Unsteadiness on feet  Other abnormalities of gait and mobility     Problem List Patient Active Problem List   Diagnosis Date Noted  . Abnormal urinalysis 08/09/2017  . Urinary urgency 08/09/2017  . Urinary frequency 08/09/2017  . Proteinuria 04/14/2017  . Hypertriglyceridemia 04/14/2017  . Controlled diabetes mellitus with microalbuminuria, without long-term current use of insulin ([place]) 09/10/2016  . Patient's noncompliance with other medical treatment and regimen- declines ARB or chol meds 09/10/2016  . Allergic rhinitis 08/13/2016  . Hyperlipidemia 02/01/2016  . GERD (gastroesophageal reflux disease) 02/01/2016  . Obesity 02/01/2016  . Esophageal  stricture 02/01/2016  . Alternating constipation and diarrhea 05/16/2014  . Preventative health care 12/04/2013  . Hypothyroidism 05/18/2013  . SVT (supraventricular tachycardia) ([place]) 04/26/2013  . Type 2 diabetes mellitus with peripheral neuropathy ([place]) 12/14/2000    Sophia Cox W. 09/16/2017, 12:18 PM  Frazier Butt., PT   [facility] [address] [place], [place], [address] Phone: [phone]   Fax:  [phone]  Name: Sophia Cox MRN: [id number] Date of Birth: 05/24/1946

## 2017-09-20 ENCOUNTER — Ambulatory Visit: Payer: Medicare Other | Admitting: Physical Therapy

## 2017-09-23 ENCOUNTER — Encounter: Payer: Self-pay | Admitting: Physical Therapy

## 2017-09-23 ENCOUNTER — Ambulatory Visit: Payer: Medicare Other | Admitting: Physical Therapy

## 2017-09-23 DIAGNOSIS — R2681 Unsteadiness on feet: Secondary | ICD-10-CM | POA: Diagnosis not present

## 2017-09-23 DIAGNOSIS — R2689 Other abnormalities of gait and mobility: Secondary | ICD-10-CM | POA: Diagnosis not present

## 2017-09-23 NOTE — Therapy (Signed)
[facility] [address] [place], [place], [address] Phone: [phone]   Fax:  [phone]  Physical Therapy Treatment  Patient Details  Name: Sophia Cox MRN: [id number] Date of Birth: 1946/06/21 Referring Provider: Dr. Delrae Rend  Encounter Date: 09/23/2017      PT End of Session - 09/23/17 1220    Visit Number 5   Number of Visits 10   Date for PT Re-Evaluation 11/05/17   Authorization Type Medicare traditional, BCBS secondary-GCODE every 10th visit   PT Start Time 0847   PT Stop Time 0930   PT Time Calculation (min) 43 min   Activity Tolerance Patient tolerated treatment well   Behavior During Therapy [facility] for tasks assessed/performed      Past Medical History:  Diagnosis Date  . Arthritis    "left knee" (06/21/2013)  . IOEVOJJK(093.8)    "weekly" (06/21/2013)  . Hypertension   . Hypothyroidism   . Pneumonia [phone]   "quit a few times" (06/21/2013)  . Supraventricular tachycardia ([place])   . Type II diabetes mellitus ([place])     Past Surgical History:  Procedure Laterality Date  . CATARACT EXTRACTION Bilateral   . CHOLECYSTECTOMY  ~ 1989  . SUPRAVENTRICULAR TACHYCARDIA ABLATION  06/21/2013  . SUPRAVENTRICULAR TACHYCARDIA ABLATION N/A 06/21/2013   Procedure: SUPRAVENTRICULAR TACHYCARDIA ABLATION;  Surgeon: Evans Lance, MD;  Location: [facility] CATH LAB;  Service: Cardiovascular;  Laterality: N/A;  . TONSILLECTOMY  ~ 1952  . TUBAL LIGATION  1971    There were no vitals filed for this visit.      Subjective Assessment - 09/23/17 0850    Subjective No falls or near falls.    Pertinent History Macular degeneration, history of falls, ?compression fractures lumbar spine, DM with neuropathy   Patient Stated Goals Pt's goal for physical therapy are to help improve balance and prevent falls   Currently in Pain? No/denies                         [facility] Adult PT Treatment/Exercise -  09/23/17 1217      Ambulation/Gait   Ambulation/Gait Assistance 5: Supervision   Ambulation/Gait Assistance Details while naming food items A to Z; cognitively was very challenging for her to keep up with what letter was next and stay on topic (at times named animals instead of food); veered off path 3 times (always left when turning right)   Ambulation Distance (Feet) 600 Feet   Assistive device None   Gait Pattern Step-through pattern;Trendelenburg;Narrow base of support;Scissoring   Ambulation Surface Level;Indoor             Balance Exercises - 09/23/17 1213      Balance Exercises: Standing   Standing Eyes Opened Narrow base of support (BOS);Head turns;Foam/compliant surface  left/right; up/down; diagonals   Standing Eyes Closed Narrow base of support (BOS);Wide (BOA);Head turns;Foam/compliant surface  left/right; up/down; diagonals; feet together, semi-tandem   Gait with Head Turns Forward  in hall; rt/lt, up/down, diagonals; visual distraction   Retro Gait Upper extremity support;3 reps  EC   Other Standing Exercises at counter, EO progressed to [facility] with light UE support, foot closest to counter stationary with opposite leg step forward and backward consecutively (no stop in the middle)           PT Education - 09/23/17 1220    Education provided Yes   Education Details see additions to [employer]) Educated Patient  Methods Explanation;Demonstration;Handout;Verbal cues   Comprehension Verbalized understanding;Returned demonstration;Verbal cues required;Need further instruction             PT Long Term Goals - 09/10/17 0539      PT LONG TERM GOAL #1   Title Pt will be independent with HEP for improved balance and gait.  TARGET 10/08/17   Time 5   Period Weeks   Status New     PT LONG TERM GOAL #2   Title Pt will imrpove DGI score to at least 19/24 for decreased fall risk.   Time 5   Period Weeks   Status New     PT LONG TERM GOAL #3   Title Pt  will improve ABC score by at least 10% for improved balance confidence.   Time 5   Period Weeks   Status New     PT LONG TERM GOAL #4   Title Sensory Organization test to be performed, with goal written as appropriate.   Baseline Composite score WNL; Sensory Analysis WNL-No goal needed.   Time 5   Period Weeks   Status Deferred     PT LONG TERM GOAL #5   Title Pt will verbalize understanding of fall prevention in the home environment.   Time 5   Period Weeks   Status New               Plan - 09/23/17 1222    Clinical Impression Statement Session focused on progressing balance exercises for home, balance and gait training. Patient has most difficulty with EC or even visually distracting environment. She has shown good progress with initial balance exercises as she needed an update today.    Rehab Potential Good   PT Frequency 2x / week   PT Duration Other (comment)  for 5 weeks    PT Treatment/Interventions ADLs/Self Care Home Management;Functional mobility training;Gait training;DME Instruction;Therapeutic activities;Therapeutic exercise;Balance training;Neuromuscular re-education;Stair training;Patient/family education   PT Next Visit Plan Review hip and ankle strategy exercises; conitnue activities on compliant surfaces, step strategy work and dynamic compliant surface activities, visually distracting envirnoment (tossing ball, etc)   Consulted and Agree with Plan of Care Patient      Patient will benefit from skilled therapeutic intervention in order to improve the following deficits and impairments:  Abnormal gait, Decreased mobility, Difficulty walking, Decreased balance  Visit Diagnosis: Unsteadiness on feet     Problem List Patient Active Problem List   Diagnosis Date Noted  . Abnormal urinalysis 08/09/2017  . Urinary urgency 08/09/2017  . Urinary frequency 08/09/2017  . Proteinuria 04/14/2017  . Hypertriglyceridemia 04/14/2017  . Controlled diabetes  mellitus with microalbuminuria, without long-term current use of insulin ([place]) 09/10/2016  . Patient's noncompliance with other medical treatment and regimen- declines ARB or chol meds 09/10/2016  . Allergic rhinitis 08/13/2016  . Hyperlipidemia 02/01/2016  . GERD (gastroesophageal reflux disease) 02/01/2016  . Obesity 02/01/2016  . Esophageal stricture 02/01/2016  . Alternating constipation and diarrhea 05/16/2014  . Preventative health care 12/04/2013  . Hypothyroidism 05/18/2013  . SVT (supraventricular tachycardia) ([place]) 04/26/2013  . Type 2 diabetes mellitus with peripheral neuropathy ([place]) 12/14/2000    Rexanne Mano , PT 09/23/2017, 12:26 PM  [facility] [address] [place], [place], [address] Phone: [phone]   Fax:  [phone]  Name: Sophia Cox MRN: [id number] Date of Birth: 1946-04-15

## 2017-09-23 NOTE — Patient Instructions (Signed)
Feet Heel-Toe "Tandem" (Compliant Surface) Head Motion - Eyes Closed    Stand on compliant surface: with right foot as close to directly in front of the other as possible. Close eyes and hold for 30 seconds Repeat __2__ times per side (right foot forward and left foot forward). Do 1sessions per day.   Copyright  VHI. All rights reserved.  Feet Together (Compliant Surface) Head Motion - Eyes Closed    Stand on compliant surface: with feet together. Close eyes and move head slowly, up and down x 10, left and right x 10.  Repeat __1__ times per session. Do __1__ sessions per day.  Copyright  VHI. All rights reserved.     Walk down your hallway 3 times in a row. Try different levels of lighting to increase challenge to your balance system.

## 2017-09-27 ENCOUNTER — Ambulatory Visit: Payer: Medicare Other | Admitting: Physical Therapy

## 2017-09-27 DIAGNOSIS — R2689 Other abnormalities of gait and mobility: Secondary | ICD-10-CM

## 2017-09-27 DIAGNOSIS — R2681 Unsteadiness on feet: Secondary | ICD-10-CM

## 2017-09-27 NOTE — Therapy (Signed)
[facility] [address] [place], [place], [address] Phone: [phone]   Fax:  [phone]  Physical Therapy Treatment  Patient Details  Name: Sophia Cox MRN: [id number] Date of Birth: [birth year] Referring Provider: Dr. Delrae Rend  Encounter Date: 09/27/2017      PT End of Session - 09/27/17 1200    Visit Number 6   Number of Visits 10   Date for PT Re-Evaluation 11/05/17   Authorization Type Medicare traditional, BCBS secondary-GCODE every 10th visit   PT Start Time 0848   PT Stop Time 0928   PT Time Calculation (min) 40 min   Activity Tolerance Patient limited by lethargy   Behavior During Therapy [facility] for tasks assessed/performed      Past Medical History:  Diagnosis Date  . Arthritis    "left knee" (06/21/2013)  . TDSKAJGO(115.7)    "weekly" (06/21/2013)  . Hypertension   . Hypothyroidism   . Pneumonia [phone]   "quit a few times" (06/21/2013)  . Supraventricular tachycardia ([place])   . Type II diabetes mellitus ([place])     Past Surgical History:  Procedure Laterality Date  . CATARACT EXTRACTION Bilateral   . CHOLECYSTECTOMY  ~ 1989  . SUPRAVENTRICULAR TACHYCARDIA ABLATION  06/21/2013  . SUPRAVENTRICULAR TACHYCARDIA ABLATION N/A 06/21/2013   Procedure: SUPRAVENTRICULAR TACHYCARDIA ABLATION;  Surgeon: Evans Lance, MD;  Location: [facility] CATH LAB;  Service: Cardiovascular;  Laterality: N/A;  . TONSILLECTOMY  ~ 1952  . TUBAL LIGATION  1971    There were no vitals filed for this visit.      Subjective Assessment - 09/27/17 0850    Subjective Did not practice exercises over the weekend, due [place] over the [place].  No falls.   Pertinent History Macular degeneration, history of falls, ?compression fractures lumbar spine, DM with neuropathy   Patient Stated Goals Pt's goal for physical therapy are to help improve balance and prevent falls   Currently in Pain? No/denies                               Balance Exercises - 09/27/17 0851      Balance Exercises: Standing   Standing Eyes Opened Narrow base of support (BOS);Head turns;Foam/compliant surface  Head nods 10 reps each   Tandem Stance Eyes closed;Foam/compliant surface;Intermittent upper extremity support;2 reps;30 secs   Wall Bumps-Hips Eyes opened;Anterior/posterior;10 reps  Solid and compliant surfaces, 10 reps each   Rockerboard Anterior/posterior;Lateral;Head turns;EO;EC  Head nods, UE movement, arm swing, hip/ankle strategy work   Engineer, maintenance (IT) in place x 10 reps, then forward kicks x 10 reps, forward step taps x 10 reps; forward/back step taps x 10 reps with UE support; sidestepping along balance beam 5 reps at counter with UE support   Gait with Head Turns Forward  in hall R/L, cues for looking at visual targets   Heel Raises Limitations x 15 reps solid and compliant surface   Toe Raise Limitations x 15 reps solid and compliant surface   Other Standing Exercises Short distance gait activities with ball toss/catch, then ball circles clockwise and counterclockwise; no LOB noted, but widened BOS with ball toss/catch           PT Education - 09/27/17 1200    Education provided Yes   Education Details Cues for use of visual targets with gait with head turns   Person(s) Educated Patient   Methods Explanation  Comprehension Verbalized understanding;Returned demonstration             PT Long Term Goals - 09/10/17 0539      PT LONG TERM GOAL #1   Title Pt will be independent with HEP for improved balance and gait.  TARGET 10/08/17   Time 5   Period Weeks   Status New     PT LONG TERM GOAL #2   Title Pt will imrpove DGI score to at least 19/24 for decreased fall risk.   Time 5   Period Weeks   Status New     PT LONG TERM GOAL #3   Title Pt will improve ABC score by at least 10% for improved balance confidence.   Time 5   Period Weeks   Status New      PT LONG TERM GOAL #4   Title Sensory Organization test to be performed, with goal written as appropriate.   Baseline Composite score WNL; Sensory Analysis WNL-No goal needed.   Time 5   Period Weeks   Status Deferred     PT LONG TERM GOAL #5   Title Pt will verbalize understanding of fall prevention in the home environment.   Time 5   Period Weeks   Status New               Plan - 09/27/17 1201    Clinical Impression Statement Continued focus on balance on compliant surfaces and dynamic balance/gait activities with head movements/distractions (ball circles, ball toss).  Pt has decreased lateral excursion with gait with use of visual targets with head turns.  Pt will benefit from continued balance, hip strategy work, specifically in the lateral direction.   Rehab Potential Good   PT Frequency 2x / week   PT Duration Other (comment)  for 5 weeks    PT Treatment/Interventions ADLs/Self Care Home Management;Functional mobility training;Gait training;DME Instruction;Therapeutic activities;Therapeutic exercise;Balance training;Neuromuscular re-education;Stair training;Patient/family education   PT Next Visit Plan Continue activities on compliant surfaces, rockerboard A/P and lateral direction; step strategy work, dynamic compliant surface activities   Consulted and Agree with Plan of Care Patient      Patient will benefit from skilled therapeutic intervention in order to improve the following deficits and impairments:  Abnormal gait, Decreased mobility, Difficulty walking, Decreased balance  Visit Diagnosis: Unsteadiness on feet  Other abnormalities of gait and mobility     Problem List Patient Active Problem List   Diagnosis Date Noted  . Abnormal urinalysis 08/09/2017  . Urinary urgency 08/09/2017  . Urinary frequency 08/09/2017  . Proteinuria 04/14/2017  . Hypertriglyceridemia 04/14/2017  . Controlled diabetes mellitus with microalbuminuria, without long-term  current use of insulin ([place]) 09/10/2016  . Patient's noncompliance with other medical treatment and regimen- declines ARB or chol meds 09/10/2016  . Allergic rhinitis 08/13/2016  . Hyperlipidemia 02/01/2016  . GERD (gastroesophageal reflux disease) 02/01/2016  . Obesity 02/01/2016  . Esophageal stricture 02/01/2016  . Alternating constipation and diarrhea 05/16/2014  . Preventative health care 12/04/2013  . Hypothyroidism 05/18/2013  . SVT (supraventricular tachycardia) ([place]) 04/26/2013  . Type 2 diabetes mellitus with peripheral neuropathy ([place]) 12/14/2000    Loveta Dellis W. 09/27/2017, 12:06 PM  Frazier Butt., PT  [facility] [address] [place], [place], [address] Phone: [phone]   Fax:  [phone]  Name: Sophia Cox MRN: [id number] Date of Birth: 12/09/1946

## 2017-09-29 ENCOUNTER — Other Ambulatory Visit: Payer: Self-pay | Admitting: Family Medicine

## 2017-09-29 DIAGNOSIS — E1142 Type 2 diabetes mellitus with diabetic polyneuropathy: Secondary | ICD-10-CM

## 2017-09-30 ENCOUNTER — Ambulatory Visit: Payer: Medicare Other | Admitting: Physical Therapy

## 2017-09-30 DIAGNOSIS — R2681 Unsteadiness on feet: Secondary | ICD-10-CM | POA: Diagnosis not present

## 2017-09-30 DIAGNOSIS — R2689 Other abnormalities of gait and mobility: Secondary | ICD-10-CM

## 2017-09-30 NOTE — Patient Instructions (Addendum)
Turning in Place: Solid Surface    Standing in place, lead with head and turn slowly making quarter turns toward left. Repeat __2-3__ times per session, each direction. Do __1__ sessions per day.  Do this with walking-just ahead of turning, turn your head first to the direction you will go (to focus on visual target), then turn your body and start walking that direction.   Copyright  VHI. All rights reserved.

## 2017-09-30 NOTE — Therapy (Signed)
[facility] [address] [place], [place], [address] Phone: [phone]   Fax:  [phone]  Physical Therapy Treatment  Patient Details  Name: Sophia Cox MRN: [id number] Date of Birth: [birth year] Referring Provider: Dr. Delrae Rend  Encounter Date: 09/30/2017      PT End of Session - 09/30/17 2128    Visit Number 7   Number of Visits 10   Date for PT Re-Evaluation 11/05/17   Authorization Type Medicare traditional, BCBS secondary-GCODE every 10th visit   PT Start Time 0806   PT Stop Time 0844   PT Time Calculation (min) 38 min   Activity Tolerance Patient tolerated treatment well   Behavior During Therapy [facility] for tasks assessed/performed      Past Medical History:  Diagnosis Date  . Arthritis    "left knee" (06/21/2013)  . WEXHBZJI(967.8)    "weekly" (06/21/2013)  . Hypertension   . Hypothyroidism   . Pneumonia [phone]   "quit a few times" (06/21/2013)  . Supraventricular tachycardia ([place])   . Type II diabetes mellitus ([place])     Past Surgical History:  Procedure Laterality Date  . CATARACT EXTRACTION Bilateral   . CHOLECYSTECTOMY  ~ 1989  . SUPRAVENTRICULAR TACHYCARDIA ABLATION  06/21/2013  . SUPRAVENTRICULAR TACHYCARDIA ABLATION N/A 06/21/2013   Procedure: SUPRAVENTRICULAR TACHYCARDIA ABLATION;  Surgeon: Evans Lance, MD;  Location: [facility] CATH LAB;  Service: Cardiovascular;  Laterality: N/A;  . TONSILLECTOMY  ~ 1952  . TUBAL LIGATION  1971    There were no vitals filed for this visit.      Subjective Assessment - 09/30/17 0808    Subjective Using the visual target with head turns has helped.  My husband says I'm paying more attention now.   Pertinent History Macular degeneration, history of falls, ?compression fractures lumbar spine, DM with neuropathy   Patient Stated Goals Pt's goal for physical therapy are to help improve balance and prevent falls   Currently in Pain? No/denies                          [facility] Adult PT Treatment/Exercise - 09/30/17 0001      High Level Balance   High Level Balance Comments Short distance gait with turns, leading with head turn, then body turns, with supervision.             Balance Exercises - 09/30/17 0819      Balance Exercises: Standing   Rockerboard Anterior/posterior;Lateral;Head turns;EO;EC  Head nods/UE movement, arm swing, hip/ankle strategy work   Water quality scientist;Intermittent upper extremity support;Foam/compliant surface;4 reps  in parallel bars   Sidestepping Foam/compliant support;Upper extremity support;4 reps  parallel bars   Turning Right;Left  2 reps each direction, quarter turns with head turn leading+   Other Standing Exercises On rockerboard- A/P direction ankle strategy work x 20, hip strategy work x 20, lateral hip/ankle strategy x 20; on incline/decline of compliant surface ramp:  head turns x 10, head nods x 10, EO and EC, marching in place, forward step taps, then sidestepping up and down ramp x 3 reps each side.           PT Education - 09/30/17 2128    Education provided Yes   Education Details turning with head turns leading   Person(s) Educated Patient   Methods Explanation;Demonstration;Handout   Comprehension Verbalized understanding             PT Long Term Goals -  09/10/17 0539      PT LONG TERM GOAL #1   Title Pt will be independent with HEP for improved balance and gait.  TARGET 10/08/17   Time 5   Period Weeks   Status New     PT LONG TERM GOAL #2   Title Pt will imrpove DGI score to at least 19/24 for decreased fall risk.   Time 5   Period Weeks   Status New     PT LONG TERM GOAL #3   Title Pt will improve ABC score by at least 10% for improved balance confidence.   Time 5   Period Weeks   Status New     PT LONG TERM GOAL #4   Title Sensory Organization test to be performed, with goal written as appropriate.   Baseline Composite score  WNL; Sensory Analysis WNL-No goal needed.   Time 5   Period Weeks   Status Deferred     PT LONG TERM GOAL #5   Title Pt will verbalize understanding of fall prevention in the home environment.   Time 5   Period Weeks   Status New               Plan - 09/30/17 2129    Clinical Impression Statement Continued focus on compliant surface balance, particularly control of balance in lateral directions.  Pt notes that visual targets with head turns helps with gait at home and overall she feels more aware of how she moves.  Pt continues to benefit from skilled PT to address the gait and balance.   Rehab Potential Good   PT Frequency 2x / week   PT Duration Other (comment)  for 5 weeks    PT Treatment/Interventions ADLs/Self Care Home Management;Functional mobility training;Gait training;DME Instruction;Therapeutic activities;Therapeutic exercise;Balance training;Neuromuscular re-education;Stair training;Patient/family education   PT Next Visit Plan Look at [facility] scale to address specific activities for balance; begin looking at LTGs and discuss d/c next week   Consulted and Agree with Plan of Care Patient      Patient will benefit from skilled therapeutic intervention in order to improve the following deficits and impairments:  Abnormal gait, Decreased mobility, Difficulty walking, Decreased balance  Visit Diagnosis: Unsteadiness on feet  Other abnormalities of gait and mobility     Problem List Patient Active Problem List   Diagnosis Date Noted  . Abnormal urinalysis 08/09/2017  . Urinary urgency 08/09/2017  . Urinary frequency 08/09/2017  . Proteinuria 04/14/2017  . Hypertriglyceridemia 04/14/2017  . Controlled diabetes mellitus with microalbuminuria, without long-term current use of insulin ([place]) 09/10/2016  . Patient's noncompliance with other medical treatment and regimen- declines ARB or chol meds 09/10/2016  . Allergic rhinitis 08/13/2016  . Hyperlipidemia 02/01/2016   . GERD (gastroesophageal reflux disease) 02/01/2016  . Obesity 02/01/2016  . Esophageal stricture 02/01/2016  . Alternating constipation and diarrhea 05/16/2014  . Preventative health care 12/04/2013  . Hypothyroidism 05/18/2013  . SVT (supraventricular tachycardia) ([place]) 04/26/2013  . Type 2 diabetes mellitus with peripheral neuropathy ([place]) 12/14/2000    Sherry Rogus W. 09/30/2017, 9:32 PM Frazier Butt., PT  [facility] [address] [place], [place], [address] Phone: [phone]   Fax:  [phone]  Name: Rabia Argote MRN: [id number] Date of Birth: 1946/11/29

## 2017-10-04 ENCOUNTER — Ambulatory Visit: Payer: Medicare Other | Admitting: Physical Therapy

## 2017-10-04 DIAGNOSIS — M8588 Other specified disorders of bone density and structure, other site: Secondary | ICD-10-CM | POA: Diagnosis not present

## 2017-10-04 DIAGNOSIS — R2689 Other abnormalities of gait and mobility: Secondary | ICD-10-CM | POA: Diagnosis not present

## 2017-10-04 DIAGNOSIS — Z780 Asymptomatic menopausal state: Secondary | ICD-10-CM | POA: Diagnosis not present

## 2017-10-04 DIAGNOSIS — R2681 Unsteadiness on feet: Secondary | ICD-10-CM

## 2017-10-04 NOTE — Therapy (Signed)
[facility] [address] [place], [place], [address] Phone: [phone]   Fax:  [phone]  Physical Therapy Treatment  Patient Details  Name: Sophia Cox MRN: [id number] Date of Birth: 20-Feb-1946 Referring Provider: Dr. Delrae Rend  Encounter Date: 10/04/2017      PT End of Session - 10/04/17 0925    Visit Number 8   Number of Visits 10   Date for PT Re-Evaluation 11/05/17   Authorization Type Medicare traditional, BCBS secondary-GCODE every 10th visit   PT Start Time 0806   PT Stop Time 0846   PT Time Calculation (min) 40 min   Activity Tolerance Patient tolerated treatment well   Behavior During Therapy [facility] for tasks assessed/performed      Past Medical History:  Diagnosis Date  . Arthritis    "left knee" (06/21/2013)  . URKYHCWC(376.2)    "weekly" (06/21/2013)  . Hypertension   . Hypothyroidism   . Pneumonia [phone]   "quit a few times" (06/21/2013)  . Supraventricular tachycardia ([place])   . Type II diabetes mellitus ([place])     Past Surgical History:  Procedure Laterality Date  . CATARACT EXTRACTION Bilateral   . CHOLECYSTECTOMY  ~ 1989  . SUPRAVENTRICULAR TACHYCARDIA ABLATION  06/21/2013  . SUPRAVENTRICULAR TACHYCARDIA ABLATION N/A 06/21/2013   Procedure: SUPRAVENTRICULAR TACHYCARDIA ABLATION;  Surgeon: Evans Lance, MD;  Location: [facility] CATH LAB;  Service: Cardiovascular;  Laterality: N/A;  . TONSILLECTOMY  ~ 1952  . TUBAL LIGATION  1971    There were no vitals filed for this visit.      Subjective Assessment - 10/04/17 0807    Subjective Had several times this weekend where I felt off balance, but I was able to catch myself.  I feel like my blood sugar tanked this morning, so I feel not so great.   Pertinent History Macular degeneration, history of falls, ?compression fractures lumbar spine, DM with neuropathy   Patient Stated Goals Pt's goal for physical therapy are to help improve  balance and prevent falls   Currently in Pain? No/denies                         [facility] Adult PT Treatment/Exercise - 10/04/17 0001      Ambulation/Gait   Ambulation/Gait Yes   Ambulation/Gait Assistance 5: Supervision   Ambulation/Gait Assistance Details Gait with head turns, head nods, turning and stopping.  Needs cues for widened BOS with turns.   Ambulation Distance (Feet) 345 Feet   Assistive device None   Gait Pattern Step-through pattern;Trendelenburg;Narrow base of support   Ambulation Surface Level   Stairs Yes   Stairs Assistance 6: Modified independent (Device/Increase time)   Stair Management Technique Two rails;No rails;Alternating pattern;Step to pattern  No rails:  step to pattern, rails:  step through patttern   Number of Stairs 4  x 4 reps   Height of Stairs 6   Curb 7: Independent  x 3 reps   Gait Comments Instructions provided for foot clearance, foot placement, safety with stair negotiation with carrying objects     Self-Care   Self-Care Other Self-Care Comments   Other Self-Care Comments  Discussed fall prevention in home and outdoor environment, with handout provided.  Reived pt's initial ABC score, with decreased confidence noted on stairs, stepping up on chair to reach items, and on icy sidewalks.  Discussed using step stool for safety in reaching higher objects, and methods to be as safe as  possible if she has to negotiate icy sidewalks.  Addressed stairs through training-see above.             Balance Exercises - 10/04/17 0923      Balance Exercises: Standing   Other Standing Exercises On incline/decline compliant surface ramp:  feet apart-EO and EC with head turns/nods x 10 reps each, then semi-tandem stance with EO head turns/nods x 10, EC head steady x 10 seconds; on compliant surface incline/decline:  marching in place x 10, forward step taps alternating legs x 10, then sidestepping up and down ramp x 3 reps each leg leading.   Standing at compliant surface at top of ramp/curb:  360 degree turns R and L with head leading, then body turn.           PT Education - 10/04/17 0925    Education provided Yes   Education Details Fall prevention education   Person(s) Educated Patient   Methods Explanation;Handout   Comprehension Verbalized understanding             PT Long Term Goals - 09/10/17 0539      PT LONG TERM GOAL #1   Title Pt will be independent with HEP for improved balance and gait.  TARGET 10/08/17   Time 5   Period Weeks   Status New     PT LONG TERM GOAL #2   Title Pt will imrpove DGI score to at least 19/24 for decreased fall risk.   Time 5   Period Weeks   Status New     PT LONG TERM GOAL #3   Title Pt will improve ABC score by at least 10% for improved balance confidence.   Time 5   Period Weeks   Status New     PT LONG TERM GOAL #4   Title Sensory Organization test to be performed, with goal written as appropriate.   Baseline Composite score WNL; Sensory Analysis WNL-No goal needed.   Time 5   Period Weeks   Status Deferred     PT LONG TERM GOAL #5   Title Pt will verbalize understanding of fall prevention in the home environment.   Time 5   Period Weeks   Status New               Plan - 10/04/17 0930    Clinical Impression Statement Provided fall prevention education and focused on stair and curb negotiation with and without rails this visit.  Also continued to work on compliant surface dynamic balance activities.  Pt feels notable improvements in being able to maintain balance with perturbations at home and feels she may be ready for discharge next visit.   Rehab Potential Good   PT Frequency 2x / week   PT Duration Other (comment)  for 5 weeks    PT Treatment/Interventions ADLs/Self Care Home Management;Functional mobility training;Gait training;DME Instruction;Therapeutic activities;Therapeutic exercise;Balance training;Neuromuscular re-education;Stair  training;Patient/family education   PT Next Visit Plan Check LTGs and plan for d/c next visit.   Consulted and Agree with Plan of Care Patient      Patient will benefit from skilled therapeutic intervention in order to improve the following deficits and impairments:  Abnormal gait, Decreased mobility, Difficulty walking, Decreased balance  Visit Diagnosis: Other abnormalities of gait and mobility  Unsteadiness on feet     Problem List Patient Active Problem List   Diagnosis Date Noted  . Abnormal urinalysis 08/09/2017  . Urinary urgency 08/09/2017  . Urinary frequency 08/09/2017  .  Proteinuria 04/14/2017  . Hypertriglyceridemia 04/14/2017  . Controlled diabetes mellitus with microalbuminuria, without long-term current use of insulin ([place]) 09/10/2016  . Patient's noncompliance with other medical treatment and regimen- declines ARB or chol meds 09/10/2016  . Allergic rhinitis 08/13/2016  . Hyperlipidemia 02/01/2016  . GERD (gastroesophageal reflux disease) 02/01/2016  . Obesity 02/01/2016  . Esophageal stricture 02/01/2016  . Alternating constipation and diarrhea 05/16/2014  . Preventative health care 12/04/2013  . Hypothyroidism 05/18/2013  . SVT (supraventricular tachycardia) ([place]) 04/26/2013  . Type 2 diabetes mellitus with peripheral neuropathy ([place]) 12/14/2000    Skie Vitrano W. 10/04/2017, 9:32 AM Frazier Butt., PT  [facility] [address] [place], [place], [address] Phone: [phone]   Fax:  [phone]  Name: Sophia Cox MRN: [id number] Date of Birth: 05-Jun-1946

## 2017-10-04 NOTE — Patient Instructions (Addendum)

## 2017-10-07 ENCOUNTER — Ambulatory Visit: Payer: Medicare Other | Admitting: Physical Therapy

## 2017-10-07 DIAGNOSIS — R2681 Unsteadiness on feet: Secondary | ICD-10-CM | POA: Diagnosis not present

## 2017-10-07 DIAGNOSIS — R2689 Other abnormalities of gait and mobility: Secondary | ICD-10-CM

## 2017-10-07 NOTE — Therapy (Signed)
[facility] [address] [place], [place], [address] Phone: [phone]   Fax:  [phone]  Physical Therapy Treatment  Patient Details  Name: Sophia Cox MRN: [id number] Date of Birth: August 07, 1946 Referring Provider: Dr. Delrae Rend  Encounter Date: 10/07/2017      PT End of Session - 10/07/17 1501    Visit Number 9   Number of Visits 10   Date for PT Re-Evaluation 11/05/17   Authorization Type Medicare traditional, BCBS secondary-GCODE every 10th visit   PT Start Time 0803   PT Stop Time 0833  goals checked/PT completed; session ended early   PT Time Calculation (min) 30 min   Activity Tolerance Patient tolerated treatment well   Behavior During Therapy [facility] for tasks assessed/performed      Past Medical History:  Diagnosis Date  . Arthritis    "left knee" (06/21/2013)  . QPRFFMBW(466.5)    "weekly" (06/21/2013)  . Hypertension   . Hypothyroidism   . Pneumonia [phone]   "quit a few times" (06/21/2013)  . Supraventricular tachycardia ([place])   . Type II diabetes mellitus ([place])     Past Surgical History:  Procedure Laterality Date  . CATARACT EXTRACTION Bilateral   . CHOLECYSTECTOMY  ~ 1989  . SUPRAVENTRICULAR TACHYCARDIA ABLATION  06/21/2013  . SUPRAVENTRICULAR TACHYCARDIA ABLATION N/A 06/21/2013   Procedure: SUPRAVENTRICULAR TACHYCARDIA ABLATION;  Surgeon: Evans Lance, MD;  Location: [facility] CATH LAB;  Service: Cardiovascular;  Laterality: N/A;  . TONSILLECTOMY  ~ 1952  . TUBAL LIGATION  1971    There were no vitals filed for this visit.      Subjective Assessment - 10/07/17 0804    Subjective Doing well today.  Really feel the visual targets have helped with my balance.   Pertinent History Macular degeneration, history of falls, ?compression fractures lumbar spine, DM with neuropathy   Patient Stated Goals Pt's goal for physical therapy are to help improve balance and prevent falls   Currently in Pain? No/denies                         [facility] Adult PT Treatment/Exercise - 10/07/17 0806      Standardized Balance Assessment   Standardized Balance Assessment Dynamic Gait Index;Timed Up and Go Test     Dynamic Gait Index   Level Surface Normal   Change in Gait Speed Normal   Gait with Horizontal Head Turns Mild Impairment   Gait with Vertical Head Turns Normal   Gait and Pivot Turn Normal   Step Over Obstacle Normal   Step Around Obstacles Normal   Steps Mild Impairment   Total Score 22     Timed Up and Go Test   TUG Normal TUG;Cognitive TUG;Manual TUG   Normal TUG (seconds) 10.12   Manual TUG (seconds) 10.12   Cognitive TUG (seconds) 10.4     High Level Balance   High Level Balance Comments Reviewed full HEP, including corner balance exercises on compliant surface-EC, EC head turns/nods, feet apart, feet together and feet tandem, heel/toe raises and wall bumps-pt return demo understanding.  Discussed importance of continued performance of HEP, even when PT is completed.     Self-Care   Self-Care Other Self-Care Comments   Other Self-Care Comments  Pt completed FOTO, with ABC score 96.3%, improved from 63.1%.  Discussed patient's improvement with balance confidence, with specific items.  Discussed progress towards goals and plans for d/c from PT.  PT Education - 11/04/17 1501    Education Details Progress towards goals and plans for d/c   Person(s) Educated Patient   Methods Explanation   Comprehension Verbalized understanding             PT Long Term Goals - 2017/11/04 1502      PT LONG TERM GOAL #1   Title Pt will be independent with HEP for improved balance and gait.  TARGET 10/08/17   Time 5   Period Weeks   Status Achieved     PT LONG TERM GOAL #2   Title Pt will imrpove DGI score to at least 19/24 for decreased fall risk.   Time 5   Period Weeks   Status Achieved     PT LONG TERM GOAL #3   Title  Pt will improve ABC score by at least 10% for improved balance confidence.   Time 5   Period Weeks   Status Achieved     PT LONG TERM GOAL #4   Title Sensory Organization test to be performed, with goal written as appropriate.   Baseline Composite score WNL; Sensory Analysis WNL-No goal needed.   Time 5   Period Weeks   Status Deferred     PT LONG TERM GOAL #5   Title Pt will verbalize understanding of fall prevention in the home environment.   Time 5   Period Weeks   Status Achieved               Plan - 11-04-17 1502    Clinical Impression Statement Pt has met LTG 1, 2, 3, 5.  LTG for Sensory ORganization test deferred as results on SOT were WNL.  Pt has noted and demonstrated overall improved balance and stability with gait, and reports decreased incidences of LOB and bumping into walls at home.  Pt is appropriate for d/c at this time.   Rehab Potential Good   PT Frequency 2x / week   PT Duration Other (comment)  for 5 weeks    PT Treatment/Interventions ADLs/Self Care Home Management;Functional mobility training;Gait training;DME Instruction;Therapeutic activities;Therapeutic exercise;Balance training;Neuromuscular re-education;Stair training;Patient/family education   PT Next Visit Plan Discharge this visit.   Consulted and Agree with Plan of Care Patient      Patient will benefit from skilled therapeutic intervention in order to improve the following deficits and impairments:  Abnormal gait, Decreased mobility, Difficulty walking, Decreased balance  Visit Diagnosis: Other abnormalities of gait and mobility  Unsteadiness on feet       G-Codes - 11/04/17 1504    Functional Assessment Tool Used (Outpatient Only) ABC score 96.3%, DGI 22/24   Functional Limitation Mobility: Walking and moving around   Mobility: Walking and Moving Around Goal Status [phone]) At least 1 percent but less than 20 percent impaired, limited or restricted   Mobility: Walking and Moving  Around Discharge Status [phone]) At least 1 percent but less than 20 percent impaired, limited or restricted      Problem List Patient Active Problem List   Diagnosis Date Noted  . Abnormal urinalysis 08/09/2017  . Urinary urgency 08/09/2017  . Urinary frequency 08/09/2017  . Proteinuria 04/14/2017  . Hypertriglyceridemia 04/14/2017  . Controlled diabetes mellitus with microalbuminuria, without long-term current use of insulin ([place]) 09/10/2016  . Patient's noncompliance with other medical treatment and regimen- declines ARB or chol meds 09/10/2016  . Allergic rhinitis 08/13/2016  . Hyperlipidemia 02/01/2016  . GERD (gastroesophageal reflux disease) 02/01/2016  . Obesity 02/01/2016  . Esophageal  stricture 02/01/2016  . Alternating constipation and diarrhea 05/16/2014  . Preventative health care 12/04/2013  . Hypothyroidism 05/18/2013  . SVT (supraventricular tachycardia) ([place]) 04/26/2013  . Type 2 diabetes mellitus with peripheral neuropathy ([place]) 12/14/2000    Kiandra Sanguinetti W. 10/07/2017, 3:08 PM  Frazier Butt., PT   [facility] [address] [place], [place], [address] Phone: [phone]   Fax:  [phone]  Name: Sophia Cox MRN: [id number] Date of Birth: 12-21-1945   PHYSICAL THERAPY DISCHARGE SUMMARY  Visits from Start of Care: 9  Current functional level related to goals / functional outcomes: Pt has met all LTGs -     PT Long Term Goals - 10/07/17 1502      PT LONG TERM GOAL #1   Title Pt will be independent with HEP for improved balance and gait.  TARGET 10/08/17   Time 5   Period Weeks   Status Achieved     PT LONG TERM GOAL #2   Title Pt will imrpove DGI score to at least 19/24 for decreased fall risk.   Time 5   Period Weeks   Status Achieved     PT LONG TERM GOAL #3   Title Pt will improve ABC score by at least 10% for improved balance confidence.   Time 5   Period Weeks    Status Achieved     PT LONG TERM GOAL #4   Title Sensory Organization test to be performed, with goal written as appropriate.   Baseline Composite score WNL; Sensory Analysis WNL-No goal needed.   Time 5   Period Weeks   Status Deferred     PT LONG TERM GOAL #5   Title Pt will verbalize understanding of fall prevention in the home environment.   Time 5   Period Weeks   Status Achieved        Remaining deficits: Occasional high level balance, but overall improvement in gait and balance   Education / Equipment: HEP, fall prevention  Plan: Patient agrees to discharge.  Patient goals were met. Patient is being discharged due to meeting the stated rehab goals.  ?????       Mady Haagensen, PT 10/07/17 3:09 PM Phone: [phone] Fax: [phone]

## 2017-10-26 ENCOUNTER — Encounter: Payer: Self-pay | Admitting: Family Medicine

## 2017-10-26 ENCOUNTER — Ambulatory Visit (INDEPENDENT_AMBULATORY_CARE_PROVIDER_SITE_OTHER): Payer: Medicare Other | Admitting: Family Medicine

## 2017-10-26 DIAGNOSIS — E1142 Type 2 diabetes mellitus with diabetic polyneuropathy: Secondary | ICD-10-CM

## 2017-10-26 MED ORDER — METFORMIN HCL ER 500 MG PO TB24: 500.0000 mg | ORAL_TABLET | Freq: Every day | ORAL | 1 refills | Status: DC

## 2017-10-26 NOTE — Progress Notes (Signed)
Impression and Recommendations:    1. Type 2 diabetes mellitus with peripheral neuropathy [facility])     -Patient has been doing so wonderfully on her lifestyle.modifications that her A1c was last 5.4 on 08/23/2017.  Dr. Buddy Duty never told her to make any medication changes.  She did go off Victoza during that office visit but nothing beyond that.  We will cut her metformin from 1000 XR daily to 500 XR daily.  We will see her back in 3 months.  She will continue her great lifestyle changes and if A1c maintains under 6 we will take her off all diabetes medicines  There are no diagnoses linked to this encounter.  There are no diagnoses linked to this encounter.  No problem-specific Assessment & Plan notes found for this encounter.    Education and routine counseling performed. Handouts provided.  No orders of the defined types were placed in this encounter.    Gross side effects, risk and benefits, and alternatives of medications and treatment plan in general discussed with patient.  Patient is aware that all medications have potential side effects and we are unable to predict every side effect or drug-drug interaction that may occur.   Patient will call with any questions prior to using medication if they have concerns.  Expresses verbal understanding and consents to current therapy and treatment regimen.  No barriers to understanding were identified.  Red flag symptoms and signs discussed in detail.  Patient expressed understanding regarding what to do in case of emergency\urgent symptoms  Please see AVS handed out to patient at the end of our visit for further patient instructions/ counseling done pertaining to today's office visit.   No Follow-up on file.    Note: This note was prepared with assistance of Dragon voice recognition software. Occasional wrong-word or sound-a-like substitutions may have occurred due to the inherent limitations of voice recognition software.  Sophia Cox 11:11 AM --------------------------------------------------------------------------------------------------------------------------------------------------------------------------------------------------------------------------------------------    Subjective:    CC:  Chief Complaint  Patient presents with  . Follow-up    HPI: Sophia Cox is a 71 y.o. female who presents to [facility] at [facility] today for issues as discussed below.  She and her husbanmd are losing the [place] VERy successful.   She was 201, 8 oz on 04/14/17--> she is now down to 172 pounds.  Now has lost 30lbs.   Through eatin gbetter and doing treadmill- 72mn minumum-and sometimes up to 40-45 minutes.  She is at a speed of 4.0 mph at an incline of 2.  - pt's goal is now 165lbs--> iniitial goal was 175.     Patient's diabetes has been so well controlled since her weight loss.  She is come off her Victoza.  She continues on the metformin 1000 mg daily but her fasting blood sugars are running in the 90s.  Last A1c was 5.4 on 08/23/2017 at Dr. KCindra Evesoffice No problems updated.   Wt Readings from Last 3 Encounters:  10/26/17 172 lb (78 kg)  08/09/17 179 lb 1.6 oz (81.2 kg)  05/31/17 194 lb (88 kg)   BP Readings from Last 3 Encounters:  10/26/17 140/74  08/09/17 124/75  06/03/17 114/82   Pulse Readings from Last 3 Encounters:  10/26/17 (!) 51  08/09/17 78  06/03/17 65   BMI Readings from Last 3 Encounters:  10/26/17 28.62 kg/m  08/09/17 29.80 kg/m  05/31/17 32.28 kg/m     Patient Care  Team    Relationship Specialty Notifications Start End  [name], DO PCP - General Family Medicine  09/10/16   Evans Lance, MD Consulting Physician Cardiology  09/10/16   Delrae Rend, MD Consulting Physician Endocrinology  09/10/16   Calvert Cantor, MD Consulting Physician Ophthalmology  04/14/17   Zadie Rhine Clent Demark, MD Consulting Physician Ophthalmology  04/14/17     Comment: retinal specialist     Patient Active Problem List   Diagnosis Date Noted  . Proteinuria 04/14/2017    Priority: High  . Hypertriglyceridemia 04/14/2017    Priority: High  . Controlled diabetes mellitus with microalbuminuria, without long-term current use of insulin ([place]) 09/10/2016    Priority: High  . Patient's noncompliance with other medical treatment and regimen- declines ARB or chol meds 09/10/2016    Priority: High  . Hyperlipidemia 02/01/2016    Priority: High  . Obesity 02/01/2016    Priority: High  . GERD (gastroesophageal reflux disease) 02/01/2016    Priority: Medium  . Hypothyroidism 05/18/2013    Priority: Medium  . Type 2 diabetes mellitus with peripheral neuropathy ([place]) 12/14/2000    Priority: Low  . Abnormal urinalysis 08/09/2017  . Urinary urgency 08/09/2017  . Urinary frequency 08/09/2017  . Allergic rhinitis 08/13/2016  . Esophageal stricture 02/01/2016  . Alternating constipation and diarrhea 05/16/2014  . Preventative health care 12/04/2013  . SVT (supraventricular tachycardia) ([place]) 04/26/2013    Past Medical history, Surgical history, Family history, Social history, Allergies and Medications have been entered into the medical record, reviewed and changed as needed.    Current Meds  Medication Sig  . Blood Glucose Monitoring Suppl (BAYER CONTOUR MONITOR) w/Device KIT 1 each by Does not apply route 2 (two) times daily at 10 AM and 5 PM.  . cetirizine (ZYRTEC) 10 MG tablet TAKE 1 TABLET BY MOUTH EVERY DAY  . cholecalciferol (VITAMIN D) 1000 units tablet Take 1,000 Units by mouth daily.  Marland Kitchen glucose blood test strip Use to test blood glucose 2 times daily. Dx: e11.29  . Insulin Pen Needle (BD PEN NEEDLE NANO U/F) 32G X 4 MM MISC Use to inject insulin 2 times daily. diag code E11.42.insulin dependent  . levothyroxine (SYNTHROID, LEVOTHROID) 88 MCG tablet Take 1 tablet by mouth daily at 8 pm.  . metFORMIN (GLUCOPHAGE-XR) 500 MG 24 hr tablet TAKE  TWO TABLETS BY MOUTH ONCE DAILY WITH BREAKFAST  . omeprazole (PRILOSEC) 20 MG capsule Take 1 capsule (20 mg total) by mouth daily.  . Turmeric 500 MG CAPS Take by mouth.  . [DISCONTINUED] ciprofloxacin (CIPRO) 250 MG tablet Take 1 tablet (250 mg total) by mouth 2 (two) times daily.  . [DISCONTINUED] cyanocobalamin 500 MCG tablet Take 500 mcg by mouth daily.  . [DISCONTINUED] liraglutide (VICTOZA) 18 MG/3ML SOPN Inject 0.2 mLs (1.2 mg total) into the skin daily.    Allergies:  Allergies  Allergen Reactions  . Eggs Or Egg-Derived Products Diarrhea  . Penicillins Other (See Comments)    Childhood reaction - hemorrhaged     Review of Systems: General:   Denies fever, chills, unexplained weight loss.  Optho/Auditory:   Denies visual changes, blurred vision/LOV Respiratory:   Denies wheeze, DOE more than baseline levels.  Cardiovascular:   Denies chest pain, palpitations, new onset peripheral edema  Gastrointestinal:   Denies nausea, vomiting, diarrhea, abd pain.  Genitourinary: Denies dysuria, freq/ urgency, flank pain or discharge from genitals.  Endocrine:     Denies hot or cold intolerance, polyuria, polydipsia. Musculoskeletal:  Denies unexplained myalgias, joint swelling, unexplained arthralgias, gait problems.  Skin:  Denies new onset rash, suspicious lesions Neurological:     Denies dizziness, unexplained weakness, numbness  Psychiatric/Behavioral:   Denies mood changes, suicidal or homicidal ideations, hallucinations    Objective:   Blood pressure 140/74, pulse (!) 51, height 5' 5"  (1.651 m), weight 172 lb (78 kg). Body mass index is 28.62 kg/m. General:  Well Developed, well nourished, appropriate for stated age.  Neuro:  Alert and oriented,  extra-ocular muscles intact  HEENT:  Normocephalic, atraumatic, neck supple, no carotid bruits appreciated  Skin:  no gross rash, warm, pink. Cardiac:  RRR, S1 S2 Respiratory:  ECTA B/L and A/P, Not using accessory muscles,  speaking in full sentences- unlabored. Vascular:  Ext warm, no cyanosis apprec.; cap RF less 2 sec. Psych:  No HI/SI, judgement and insight good, Euthymic mood. Full Affect.

## 2017-10-26 NOTE — Patient Instructions (Addendum)
I want you to do Cooks Hook-up and square breathing 4-5 times per day.  10 square breathing each time.   Transcendental meditation.  Mind body awareness exercises  ---> For your bone density please get me those results from Dr. Cindra Eves office since they are [facility] physicians I cannot see those results.  Just bring them in at your convenience and give them to [place].   Or sign paperwork so Dr. Buddy Duty can release information to [place] and we can get that from him    -Also we need to transition your brain into thinking more positively.  These tasks below are some things I want you to do every day 1)  write 3 new things that you are grateful for every day for 21 days  2)  exercise daily- walk for 15 minutes twice a day every day 3)  you are going to journal every day about one positive experience that you had 4)  meditate every day.  You can go on YouTube and look for 15-minute relaxation meditation or what ever.  But we need to make sure that you are in the moment and relaxing and deep breathing every day 5)  Write 1 positive email every day to praise someone in your life   - If you have insomnia or difficulty sleeping, this information is for you:  - Avoid caffeinated beverages after lunch,  no alcoholic beverages,  no eating within 2-3 hours of lying down,  avoid exposure to blue light before bed,  avoid daytime naps, and  needs to maintain a regular sleep schedule- go to sleep and wake up around the same time every night.   - Resolve concerns or worries before entering bedroom:  Discussed relaxation techniques with patient and to keep a journal to write down fears\ worries.  I suggested seeing a counselor for CBT.   - Recommend patient meditate or do deep breathing exercises to help relax.   Incorporate the use of white noise machines or listen to "sleep meditation music", or recordings of guided meditations for sleep from YouTube which are free, such as  "guided meditation for detachment from over  thinking"  by Mayford Knife.        Due to your concerns about your memeory and early onset dementia.

## 2017-10-28 NOTE — Therapy (Signed)
[facility] [address] [place], [place], [address] Phone: [phone]   Fax:  [phone]  Patient Details  Name: Sophia Cox MRN: [id number] Date of Birth: May 01, 1946 Referring Provider:  No ref. provider found  Encounter Date: 10/28/2017  Pt did not originally get scheduled for O.T., and when d/c from P.T., pt felt she no longer needed O.T. Therefore will d/c episode of care at this time. Pt was only seen for evaluation and did not return for any further O.T. May benefit from low vision evaluation d/t macular degeneration. This was discussed as a possibility during the evaluation  Carey Bullocks, OTR/L 10/28/2017, 11:50 AM  [facility] [address] [place], [place], [address] Phone: [phone]   Fax:  [phone]

## 2017-12-23 ENCOUNTER — Other Ambulatory Visit: Payer: Self-pay | Admitting: Family Medicine

## 2017-12-23 DIAGNOSIS — E1142 Type 2 diabetes mellitus with diabetic polyneuropathy: Secondary | ICD-10-CM

## 2018-01-13 ENCOUNTER — Telehealth: Payer: Self-pay | Admitting: Family Medicine

## 2018-01-13 NOTE — Telephone Encounter (Signed)
Patient called request appt. for Flu- like symptoms --advise provider booked full today but would forward message to medical assistant to see if Rx could be called in for patient---Pls contact pt at [phone] if possible. --glh

## 2018-01-14 NOTE — Telephone Encounter (Signed)
Called patient and she states include non productive cough, bodyaches, weakness and fevers.  No fever today.  Symptoms started Saturday.  notified her that per Dr. Hershal Coria instructions she is out of the window for tamiflu; patient to continue to push fluids, monitor temp., to use hummifier and over the OTC medications for cold and flu.  If symptoms worsen or if fever spikes again over 101 over the weekend patient to go to Urgent Care or Med Center.  Patient will follow up with our office on Monday if no improvement. MPulliam, CMA/RT(R)

## 2018-01-17 ENCOUNTER — Encounter: Payer: Self-pay | Admitting: Adult Health

## 2018-01-17 ENCOUNTER — Ambulatory Visit (INDEPENDENT_AMBULATORY_CARE_PROVIDER_SITE_OTHER): Payer: Medicare Other | Admitting: Adult Health

## 2018-01-17 VITALS — BP 113/71 | HR 68 | Temp 97.8°F | Ht 65.0 in | Wt 164.3 lb

## 2018-01-17 DIAGNOSIS — H6690 Otitis media, unspecified, unspecified ear: Secondary | ICD-10-CM

## 2018-01-17 DIAGNOSIS — J0100 Acute maxillary sinusitis, unspecified: Secondary | ICD-10-CM | POA: Insufficient documentation

## 2018-01-17 DIAGNOSIS — R509 Fever, unspecified: Secondary | ICD-10-CM | POA: Diagnosis not present

## 2018-01-17 DIAGNOSIS — R6883 Chills (without fever): Secondary | ICD-10-CM | POA: Diagnosis not present

## 2018-01-17 DIAGNOSIS — M791 Myalgia, unspecified site: Secondary | ICD-10-CM | POA: Diagnosis not present

## 2018-01-17 LAB — POCT INFLUENZA A/B
Influenza A, POC: NEGATIVE
Influenza B, POC: NEGATIVE

## 2018-01-17 MED ORDER — AZITHROMYCIN 250 MG PO TABS: ORAL_TABLET | ORAL | 0 refills | Status: DC

## 2018-01-17 NOTE — Assessment & Plan Note (Signed)
PCN allergy Z pack provided

## 2018-01-17 NOTE — Patient Instructions (Signed)
Sinusitis, Adult Sinusitis is soreness and inflammation of your sinuses. Sinuses are hollow spaces in the bones around your face. Your sinuses are located:  Around your eyes.  In the middle of your forehead.  Behind your nose.  In your cheekbones.  Your sinuses and nasal passages are lined with a stringy fluid (mucus). Mucus normally drains out of your sinuses. When your nasal tissues become inflamed or swollen, the mucus can become trapped or blocked so air cannot flow through your sinuses. This allows bacteria, viruses, and funguses to grow, which leads to infection. Sinusitis can develop quickly and last for 7?10 days (acute) or for more than 12 weeks (chronic). Sinusitis often develops after a cold. What are the causes? This condition is caused by anything that creates swelling in the sinuses or stops mucus from draining, including:  Allergies.  Asthma.  Bacterial or viral infection.  Abnormally shaped bones between the nasal passages.  Nasal growths that contain mucus (nasal polyps).  Narrow sinus openings.  Pollutants, such as chemicals or irritants in the air.  A foreign object stuck in the nose.  A fungal infection. This is rare.  What increases the risk? The following factors may make you more likely to develop this condition:  Having allergies or asthma.  Having had a recent cold or respiratory tract infection.  Having structural deformities or blockages in your nose or sinuses.  Having a weak immune system.  Doing a lot of swimming or diving.  Overusing nasal sprays.  Smoking.  What are the signs or symptoms? The main symptoms of this condition are pain and a feeling of pressure around the affected sinuses. Other symptoms include:  Upper toothache.  Earache.  Headache.  Bad breath.  Decreased sense of smell and taste.  A cough that may get worse at night.  Fatigue.  Fever.  Thick drainage from your nose. The drainage is often green and  it may contain pus (purulent).  Stuffy nose or congestion.  Postnasal drip. This is when extra mucus collects in the throat or back of the nose.  Swelling and warmth over the affected sinuses.  Sore throat.  Sensitivity to light.  How is this diagnosed? This condition is diagnosed based on symptoms, a medical history, and a physical exam. To find out if your condition is acute or chronic, your health care provider may:  Look in your nose for signs of nasal polyps.  Tap over the affected sinus to check for signs of infection.  View the inside of your sinuses using an imaging device that has a light attached (endoscope).  If your health care provider suspects that you have chronic sinusitis, you may also:  Be tested for allergies.  Have a sample of mucus taken from your nose (nasal culture) and checked for bacteria.  Have a mucus sample examined to see if your sinusitis is related to an allergy.  If your sinusitis does not respond to treatment and it lasts longer than 8 weeks, you may have an MRI or CT scan to check your sinuses. These scans also help to determine how severe your infection is. In rare cases, a bone biopsy may be done to rule out more serious types of fungal sinus disease. How is this treated? Treatment for sinusitis depends on the cause and whether your condition is chronic or acute. If a virus is causing your sinusitis, your symptoms will go away on their own within 10 days. You may be given medicines to relieve your symptoms,   including:  Topical nasal decongestants. They shrink swollen nasal passages and let mucus drain from your sinuses.  Antihistamines. These drugs block inflammation that is triggered by allergies. This can help to ease swelling in your nose and sinuses.  Topical nasal corticosteroids. These are nasal sprays that ease inflammation and swelling in your nose and sinuses.  Nasal saline washes. These rinses can help to get rid of thick mucus in  your nose.  If your condition is caused by bacteria, you will be given an antibiotic medicine. If your condition is caused by a fungus, you will be given an antifungal medicine. Surgery may be needed to correct underlying conditions, such as narrow nasal passages. Surgery may also be needed to remove polyps. Follow these instructions at home: Medicines  Take, use, or apply over-the-counter and prescription medicines only as told by your health care provider. These may include nasal sprays.  If you were prescribed an antibiotic medicine, take it as told by your health care provider. Do not stop taking the antibiotic even if you start to feel better. Hydrate and Humidify  Drink enough water to keep your urine clear or pale yellow. Staying hydrated will help to thin your mucus.  Use a cool mist humidifier to keep the humidity level in your home above 50%.  Inhale steam for 10-15 minutes, 3-4 times a day or as told by your health care provider. You can do this in the bathroom while a hot shower is running.  Limit your exposure to cool or dry air. Rest  Rest as much as possible.  Sleep with your head raised (elevated).  Make sure to get enough sleep each night. General instructions  Apply a warm, moist washcloth to your face 3-4 times a day or as told by your health care provider. This will help with discomfort.  Wash your hands often with soap and water to reduce your exposure to viruses and other germs. If soap and water are not available, use hand sanitizer.  Do not smoke. Avoid being around people who are smoking (secondhand smoke).  Keep all follow-up visits as told by your health care provider. This is important. Contact a health care provider if:  You have a fever.  Your symptoms get worse.  Your symptoms do not improve within 10 days. Get help right away if:  You have a severe headache.  You have persistent vomiting.  You have pain or swelling around your face or  eyes.  You have vision problems.  You develop confusion.  Your neck is stiff.  You have trouble breathing. This information is not intended to replace advice given to you by your health care provider. Make sure you discuss any questions you have with your health care provider. Document Released: 11/30/2005 Document Revised: 07/26/2016 Document Reviewed: 09/25/2015 Elsevier Interactive Patient Education  2018 [employer].   Otitis Media, Adult Otitis media occurs when there is inflammation and fluid in the middle ear. Your middle ear is a part of the ear that contains bones for hearing as well as air that helps send sounds to your brain. What are the causes? This condition is caused by a blockage in the eustachian tube. This tube drains fluid from the ear to the back of the nose (nasopharynx). A blockage in this tube can be caused by an object or by swelling (edema) in the tube. Problems that can cause a blockage include:  A cold or other upper respiratory infection.  Allergies.  An irritant, such as  tobacco smoke.  Enlarged adenoids. The adenoids are areas of soft tissue located high in the back of the throat, behind the nose and the roof of the mouth.  A mass in the nasopharynx.  Damage to the ear caused by pressure changes (barotrauma).  What are the signs or symptoms? Symptoms of this condition include:  Ear pain.  A fever.  Decreased hearing.  A headache.  Tiredness (lethargy).  Fluid leaking from the ear.  Ringing in the ear.  How is this diagnosed? This condition is diagnosed with a physical exam. During the exam your health care provider will use an instrument called an otoscope to look into your ear and check for redness, swelling, and fluid. He or she will also ask about your symptoms. Your health care provider may also order tests, such as:  A test to check the movement of the eardrum (pneumatic otoscopy). This test is done by squeezing a small amount  of air into the ear.  A test that changes air pressure in the middle ear to check how well the eardrum moves and whether the eustachian tube is working (tympanogram).  How is this treated? This condition usually goes away on its own within 3-5 days. But if the condition is caused by a bacteria infection and does not go away own its own, or keeps coming back, your health care provider may:  Prescribe antibiotic medicines to treat the infection.  Prescribe or recommend medicines to control pain.  Follow these instructions at home:  Take over-the-counter and prescription medicines only as told by your health care provider.  If you were prescribed an antibiotic medicine, take it as told by your health care provider. Do not stop taking the antibiotic even if you start to feel better.  Keep all follow-up visits as told by your health care provider. This is important. Contact a health care provider if:  You have bleeding from your nose.  There is a lump on your neck.  You are not getting better in 5 days.  You feel worse instead of better. Get help right away if:  You have severe pain that is not controlled with medicine.  You have swelling, redness, or pain around your ear.  You have stiffness in your neck.  A part of your face is paralyzed.  The bone behind your ear (mastoid) is tender when you touch it.  You develop a severe headache. Summary  Otitis media is redness, soreness, and swelling of the middle ear.  This condition usually goes away on its own within 3-5 days.  If the problem does not go away in 3-5 days, your health care provider may prescribe or recommend medicines to treat your symptoms.  If you were prescribed an antibiotic medicine, take it as told by your health care provider. This information is not intended to replace advice given to you by your health care provider. Make sure you discuss any questions you have with your health care provider. Document  Released: 09/04/2004 Document Revised: 11/20/2016 Document Reviewed: 11/20/2016 Elsevier Interactive Patient Education  2018 [employer].  Flu Test Negative Please take Azithromycin as directed. Increase fluids/rest/vit c-2,000mg /day Continue Delsym as directed by manufacturer. If symptoms persist after antibiotic completed, then please call clinic. GREAT JOB ON THE WEIGHT LOSS! FEEL BETTER!

## 2018-01-17 NOTE — Assessment & Plan Note (Signed)
Flu Test Negative Please take Azithromycin as directed. Increase fluids/rest/vit c-2,000mg /day Continue Delsym as directed by manufacturer. If symptoms persist after antibiotic completed, then please call clinic.

## 2018-01-17 NOTE — Assessment & Plan Note (Signed)
Flu test Neg 

## 2018-01-17 NOTE — Progress Notes (Signed)
Subjective:    Patient ID: Sophia Cox, female    DOB: 1946-10-07, 72 y.o.   MRN: [id number]  HPI:  Sophia Cox presents with copious thick/clear nasal drainage, non-productive cough, R ear pain (6/10, aching/throbbing), fever, HA (3/10 dull/ache) and fatigue that all started >1.5 weeks ago and have been steadily worsening.  She has been using OTC Delsym with some sx relief. Of Note- she has lost >40 lbs since Spring 2018 via [employer]- GREAT!  Patient Care Team    Relationship Specialty Notifications Start End  [name], DO PCP - General Family Medicine  09/10/16   Evans Lance, MD Consulting Physician Cardiology  09/10/16   Delrae Rend, MD Consulting Physician Endocrinology  09/10/16   Calvert Cantor, MD Consulting Physician Ophthalmology  04/14/17   Zadie Rhine Clent Demark, MD Consulting Physician Ophthalmology  04/14/17    Comment: retinal specialist    Patient Active Problem List   Diagnosis Date Noted  . Fever 01/17/2018  . Myalgia 01/17/2018  . Chills 01/17/2018  . Acute otitis media 01/17/2018  . Acute maxillary sinusitis 01/17/2018  . Abnormal urinalysis 08/09/2017  . Urinary urgency 08/09/2017  . Urinary frequency 08/09/2017  . Proteinuria 04/14/2017  . Hypertriglyceridemia 04/14/2017  . Controlled diabetes mellitus with microalbuminuria, without long-term current use of insulin ([place]) 09/10/2016  . Patient's noncompliance with other medical treatment and regimen- declines ARB or chol meds 09/10/2016  . Allergic rhinitis 08/13/2016  . Hyperlipidemia 02/01/2016  . GERD (gastroesophageal reflux disease) 02/01/2016  . Obesity 02/01/2016  . Esophageal stricture 02/01/2016  . Alternating constipation and diarrhea 05/16/2014  . Preventative health care 12/04/2013  . Hypothyroidism 05/18/2013  . SVT (supraventricular tachycardia) ([place]) 04/26/2013  . Type 2 diabetes mellitus with peripheral neuropathy ([place]) 12/14/2000     Past Medical History:   Diagnosis Date  . Arthritis    "left knee" (06/21/2013)  . PTWSFKCL(275.1)    "weekly" (06/21/2013)  . Hypertension   . Hypothyroidism   . Pneumonia [phone]   "quit a few times" (06/21/2013)  . Supraventricular tachycardia ([place])   . Type II diabetes mellitus ([place])      Past Surgical History:  Procedure Laterality Date  . CATARACT EXTRACTION Bilateral   . CHOLECYSTECTOMY  ~ 1989  . SUPRAVENTRICULAR TACHYCARDIA ABLATION  06/21/2013  . SUPRAVENTRICULAR TACHYCARDIA ABLATION N/A 06/21/2013   Procedure: SUPRAVENTRICULAR TACHYCARDIA ABLATION;  Surgeon: Evans Lance, MD;  Location: [facility] CATH LAB;  Service: Cardiovascular;  Laterality: N/A;  . TONSILLECTOMY  ~ 1952  . TUBAL LIGATION  1971     Family History  Problem Relation Age of Onset  . Lung cancer Mother   . Alcohol abuse Mother   . Stroke Father   . Diabetes Father   . Alcohol abuse Father   . Heart attack Father   . Diabetes Paternal Uncle   . Stroke Maternal Grandfather   . Cancer Paternal Grandmother 68       cervical  . Diabetes Paternal Grandmother   . Heart attack Paternal Grandfather   . Colon cancer Neg Hx   . Esophageal cancer Neg Hx   . Stomach cancer Neg Hx      Social History   Substance and Sexual Activity  Drug Use No     Social History   Substance and Sexual Activity  Alcohol Use Yes  . Alcohol/week: 2.4 oz  . Types: 4 Glasses of wine per week     Social History   Tobacco Use  Smoking Status  Never Smoker  Smokeless Tobacco Never Used     Outpatient Encounter Medications as of 01/17/2018  Medication Sig  . Blood Glucose Monitoring Suppl (BAYER CONTOUR MONITOR) w/Device KIT 1 each by Does not apply route 2 (two) times daily at 10 AM and 5 PM.  . cetirizine (ZYRTEC) 10 MG tablet TAKE 1 TABLET BY MOUTH EVERY DAY  . cholecalciferol (VITAMIN D) 1000 units tablet Take 1,000 Units by mouth daily.  Marland Kitchen glucose blood test strip Use to test blood glucose 2 times daily. Dx: e11.29  . Insulin Pen  Needle (BD PEN NEEDLE NANO U/F) 32G X 4 MM MISC Use to inject insulin 2 times daily. diag code E11.42.insulin dependent  . levothyroxine (SYNTHROID, LEVOTHROID) 88 MCG tablet Take 1 tablet by mouth daily at 8 pm.  . metFORMIN (GLUCOPHAGE-XR) 500 MG 24 hr tablet TAKE TWO TABLETS BY MOUTH ONCE DAILY WITH BREAKFAST (Patient taking differently: one tablet daily)  . omeprazole (PRILOSEC) 20 MG capsule Take 1 capsule (20 mg total) by mouth daily.  . Turmeric 500 MG CAPS Take by mouth.  Marland Kitchen azithromycin (ZITHROMAX) 250 MG tablet 2 tabs day one. 1 tab daily days two-five   No facility-administered encounter medications on file as of 01/17/2018.     Allergies: Eggs or egg-derived products and Penicillins  Body mass index is 27.34 kg/m.  Blood pressure 113/71, pulse 68, temperature 97.8 F (36.6 C), temperature source Oral, height 5' 5"  (1.651 m), weight 164 lb 4.8 oz (74.5 kg), SpO2 99 %.     Review of Systems  Constitutional: Positive for activity change, fatigue and fever. Negative for appetite change, chills, diaphoresis and unexpected weight change.  HENT: Positive for congestion and postnasal drip.   Eyes: Negative for visual disturbance.  Respiratory: Positive for cough. Negative for chest tightness, shortness of breath, wheezing and stridor.   Cardiovascular: Negative for chest pain, palpitations and leg swelling.  Gastrointestinal: Negative for abdominal distention, abdominal pain, anal bleeding, blood in stool, constipation, diarrhea, nausea and vomiting.  Endocrine: Negative for cold intolerance, heat intolerance, polydipsia, polyphagia and polyuria.  Genitourinary: Negative for difficulty urinating and flank pain.  Skin: Negative for color change, pallor, rash and wound.  Neurological: Positive for headaches. Negative for dizziness.  Hematological: Does not bruise/bleed easily.       Objective:   Physical Exam  Constitutional: She is oriented to person, place, and time. She  appears well-developed and well-nourished. No distress.  HENT:  Head: Normocephalic and atraumatic.  Right Ear: Hearing, external ear and ear canal normal. Tympanic membrane is erythematous and bulging. No decreased hearing is noted.  Left Ear: Hearing, external ear and ear canal normal. Tympanic membrane is erythematous and bulging. No decreased hearing is noted.  Nose: Mucosal edema and rhinorrhea present. Right sinus exhibits maxillary sinus tenderness. Right sinus exhibits no frontal sinus tenderness. Left sinus exhibits maxillary sinus tenderness. Left sinus exhibits no frontal sinus tenderness.  Mouth/Throat: Uvula is midline, oropharynx is clear and moist and mucous membranes are normal. No oropharyngeal exudate, posterior oropharyngeal edema, posterior oropharyngeal erythema or tonsillar abscesses.  Eyes: Conjunctivae are normal. Pupils are equal, round, and reactive to light.  Neck: Normal range of motion. Neck supple.  Cardiovascular: Normal rate, regular rhythm, normal heart sounds and intact distal pulses.  No murmur heard. Pulmonary/Chest: Effort normal and breath sounds normal. No respiratory distress. She has no wheezes. She has no rales. She exhibits no tenderness.  Lymphadenopathy:    She has no cervical adenopathy.  Neurological: She  is alert and oriented to person, place, and time.  Skin: Skin is warm and dry. No rash noted. She is not diaphoretic. No erythema. No pallor.  Psychiatric: She has a normal mood and affect. Her behavior is normal. Judgment and thought content normal.  Nursing note and vitals reviewed.         Assessment & Plan:   1. Fever, unspecified fever cause   2. Myalgia   3. Chills   4. Acute otitis media, unspecified otitis media type   5. Acute maxillary sinusitis, recurrence not specified     Acute otitis media PCN allergy Z pack provided  Fever Flu test Neg  Acute maxillary sinusitis Flu Test Negative Please take Azithromycin as  directed. Increase fluids/rest/vit c-2,031m/day Continue Delsym as directed by manufacturer. If symptoms persist after antibiotic completed, then please call clinic.    FOLLOW-UP:  Return if symptoms worsen or fail to improve.

## 2018-01-26 ENCOUNTER — Encounter: Payer: Self-pay | Admitting: Family Medicine

## 2018-01-26 ENCOUNTER — Ambulatory Visit (INDEPENDENT_AMBULATORY_CARE_PROVIDER_SITE_OTHER): Payer: Medicare Other | Admitting: Family Medicine

## 2018-01-26 VITALS — BP 132/72 | HR 60 | Ht 65.0 in | Wt 165.4 lb

## 2018-01-26 DIAGNOSIS — E785 Hyperlipidemia, unspecified: Secondary | ICD-10-CM

## 2018-01-26 DIAGNOSIS — E038 Other specified hypothyroidism: Secondary | ICD-10-CM | POA: Diagnosis not present

## 2018-01-26 DIAGNOSIS — I10 Essential (primary) hypertension: Secondary | ICD-10-CM

## 2018-01-26 DIAGNOSIS — E1129 Type 2 diabetes mellitus with other diabetic kidney complication: Secondary | ICD-10-CM | POA: Diagnosis not present

## 2018-01-26 DIAGNOSIS — E1142 Type 2 diabetes mellitus with diabetic polyneuropathy: Secondary | ICD-10-CM | POA: Diagnosis not present

## 2018-01-26 DIAGNOSIS — R809 Proteinuria, unspecified: Secondary | ICD-10-CM | POA: Diagnosis not present

## 2018-01-26 DIAGNOSIS — E559 Vitamin D deficiency, unspecified: Secondary | ICD-10-CM | POA: Diagnosis not present

## 2018-01-26 DIAGNOSIS — E1159 Type 2 diabetes mellitus with other circulatory complications: Secondary | ICD-10-CM | POA: Diagnosis not present

## 2018-01-26 DIAGNOSIS — E663 Overweight: Secondary | ICD-10-CM | POA: Diagnosis not present

## 2018-01-26 DIAGNOSIS — E782 Mixed hyperlipidemia: Secondary | ICD-10-CM | POA: Diagnosis not present

## 2018-01-26 DIAGNOSIS — E1169 Type 2 diabetes mellitus with other specified complication: Secondary | ICD-10-CM | POA: Diagnosis not present

## 2018-01-26 LAB — POCT GLYCOSYLATED HEMOGLOBIN (HGB A1C): Hemoglobin A1C: 5.2

## 2018-01-26 NOTE — Patient Instructions (Signed)
Go on youtube, find free videos for health/ exercise.   - do core strengthening exercises - look into strength banding - tricep exercises; chair dips, push-ups - walking 15-20 daily is good goal

## 2018-01-26 NOTE — Progress Notes (Signed)
Impression and Recommendations:    1. Type 2 diabetes mellitus with peripheral neuropathy (HCC)   2. Hypertension associated with diabetes ([place])   3. Controlled type 2 diabetes mellitus with microalbuminuria, without long-term current use of insulin ([place])   4. Hyperlipidemia associated with type 2 diabetes mellitus ([place])   5. Dyslipidemia with low high density lipoprotein (HDL) cholesterol with hypertriglyceridemia due to type 2 diabetes mellitus ([place])   6. Overweight (BMI 25.0-29.9)   7. Other specified hypothyroidism   8. 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 Vitamin D deficiency     1. Type II Diabetes Mellitus - Stable, controlled.  Continue on medications as prescribed.  - Her A1C today is 5.3.  - Blood Pressure measured in the office manually using a cuff: 132/72  - During the appointment today, the patient noted that when she first found out she had diabetes, her A1C was at an 11, 12 for years, and her sugars were "off the meter" "at 600."   - Reviewed that the patient's normal fasting blood sugar average of about 110 is within the normal range.  - Patient's goal is to be "off of every kind of med," and reviewed that this may be within reach given her weight loss.  2. Weight & Health Maintenance - The patient has lost a minimum of 35 lbs. - Patient was encouraged to continue losing weight and working hard on her health.  04/14/2017 - patient was 201 lbs 01/26/2018 - patient is 165 lbs. Body mass index is 27.52 kg/m.   - Patient should continue with her weight loss routine as established.  She should continue tracking her calorie intake using her LoseIt app and continue working on exercise, strength training, and overall activity.  - Recommended that the patient begin using resistance bands along with her strength training.  Reviewed chair dips with the patient, push-ups, and other exercises that the patient can perform at  home.  - Goal is going on Youtube, watching free videos - core strengthening encouraged.  - Recommended that the patient eventually strive for at least 150 minutes of cardio per week according to guidelines established by the [facility].   - Walking 15-20 minutes daily is a good goal.  - Patient should also consume adequate amounts of water - half of body weight in oz of water per day  - Reviewed that as long as she is in the 24, 25, 26 range of BMI, her health will remain significantly improved.  A BMI of 30 and over is the most dangerous BMI, and the patient has already dipped below this measure.  - Explained to patient what BMI refers to, and what it means medically.    Told patient to think about it as a "medical risk stratification measurement" and how increasing BMI is associated with increasing risk/ or worsening state of various diseases such as hypertension, hyperlipidemia, diabetes, premature OA, depression etc.  American Heart Association guidelines for healthy diet, basically Mediterranean diet, and exercise guidelines of 30 minutes 5 days per week or more discussed in detail.  Health counseling performed.  All questions answered.  3. Vitamin D Deficiency - Patient will continue taking her vitmain D supplement.  4. Follow-Up - We will continue to monitor the patient given her history of severe diabetes, and persistent proteinuria.   - Around May 2019, we will re-check her cholesterol - fasting lipid profile will be drawn.  We will also check her  urine for proteinuria at that time.  - Follow-up early May - Make OV with clinic, then one week prior come in for fasting blood work.   Education and routine counseling performed. Handouts provided.  Orders Placed This Encounter  Procedures  . Comprehensive metabolic panel  . CBC with Differential/Platelet  . Hemoglobin A1c  . Lipid panel  . VITAMIN D 25 Hydroxy (Vit-D Deficiency, Fractures)  . TSH  . T4, free  . T3, free  .  Microalbumin / creatinine urine ratio  . POCT glycosylated hemoglobin (Hb A1C)    No orders of the defined types were placed in this encounter.   Return for Follow-up early May, make follow-up appointment with me then, 1 week prior come in for fasting blood.   The patient was counseled, risk factors were discussed, anticipatory guidance given.  Gross side effects, risk and benefits, and alternatives of medications discussed with patient.  Patient is aware that all medications have potential side effects and we are unable to predict every side effect or drug-drug interaction that may occur.  Expresses verbal understanding and consents to current therapy plan and treatment regimen.  Please see AVS handed out to patient at the end of our visit for further patient instructions/ counseling done pertaining to today's office visit.    Note: This document was prepared using Dragon voice recognition software and may include unintentional dictation errors.  This document serves as a record of services personally performed by [name], DO. It was created on her behalf by Toni Amend, a trained medical scribe. The creation of this record is based on the scribe's personal observations and the provider's statements to them.   I have reviewed the above medical documentation for accuracy and completeness and I concur.  [name] 02/17/18 4:24 PM     Subjective:    Chief Complaint  Patient presents with  . Follow-up    Dru Laurel is a 72 y.o. female who presents to [place] [facility] today for Diabetes Management.    04/14/2017 - patient was 201 lbs 01/26/2018 - patient is 165 lbs. Body mass index is 27.52 kg/m.   At her home scale, she's lost between 38-40 lbs.  She has reached her second goal, and is now starting on a third goal and trying to get into the 150's.  Patient notes that, for the first time, she feels "really different."  She  feels "almost like a small chick."  She started her weight loss journey at 201.5 lbs, noting that she didn't want to return to 230 lbs like she was when she got married.  She began using the [employer].  This made her being to think about her mindless eating habits, and the fact that she was a "sugarholic."  She notes that she used to constantly crave sugar and carbs, and is finally starting to shed those cravings.  As an example, she remarks that she can have crackers in the house without secretly scarfing down the box.  She was a Engineer, manufacturing in the past.  Her husband joined in on the plan because she started losing weight.    The patient remarks that she's working hard on toning her arms since losing the weight.  When she first found out she had diabetes, she notes walking around with an A1C at an 11, 12 - her sugars were off the meter "at 600."  She notes that she was walking around with her A1C that high  for years.  DM HPI: -  She has been working on diet and exercise for diabetes.  Since she got sick about a week and a half ago, she has no energy or stamina. Normally she runs 40-45 minutes on the treadmill daily.  She continues visiting endocrinology once per year.   Pt is currently maintained on the following medications for diabetes:  Metformin; see med list today Medication compliance - patient is compliant on medications.   Eventually, she would like to be off of all of her meds.  Home glucose readings range:  Every morning, her sugars are around 110 average, but she notes that they can be much much lower. Specifically mentions a low reading of 79.  Peripheral Neuropathy Notes that her peripheral neuropathy is okay.  She says "I have some, but it's not bad."   Denies polyuria/polydipsia. Denies hypo/ hyperglycemia symptoms - She denies new onset of: chest pain, exercise intolerance, shortness of breath, dizziness, visual changes, headache, lower extremity swelling or  claudication.   She continues going for diabetic eye exams, and has no retinal damage.  Last diabetic eye exam was: Lab Results  Component Value Date   HMDIABEYEEXA No Retinopathy 06/02/2017    Foot exam- UTD  Last A1C in the office was: 5.3 (01/26/2018)  Lab Results  Component Value Date   HGBA1C 5.2 01/26/2018   HGBA1C 5.4 08/23/2017   HGBA1C 6.6 04/14/2017    Lab Results  Component Value Date   MICROALBUR 2.63 08/23/2017   LDLCALC 124 (H) 04/14/2017   CREATININE 0.7 08/23/2017      Last 3 blood pressure readings in our office are as follows: BP Readings from Last 3 Encounters:  01/26/18 132/72  01/17/18 113/71  10/26/17 140/74    BMI Readings from Last 3 Encounters:  01/26/18 27.52 kg/m  01/17/18 27.34 kg/m  10/26/17 28.62 kg/m     No problems updated.    Patient Care Team    Relationship Specialty Notifications Start End  [name], DO PCP - General Family Medicine  09/10/16   Evans Lance, MD Consulting Physician Cardiology  09/10/16   Delrae Rend, MD Consulting Physician Endocrinology  09/10/16   Calvert Cantor, MD Consulting Physician Ophthalmology  04/14/17   Zadie Rhine Clent Demark, MD Consulting Physician Ophthalmology  04/14/17    Comment: retinal specialist     Patient Active Problem List   Diagnosis Date Noted  . 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 Dyslipidemia with low high density lipoprotein (HDL) cholesterol with hypertriglyceridemia due to type 2 diabetes mellitus ([place]) 04/14/2017    Priority: High  . Controlled diabetes mellitus with microalbuminuria, without long-term current use of insulin ([place]) 09/10/2016    Priority: High  . Patient's noncompliance with other medical treatment and regimen- declines ARB or chol meds 09/10/2016    Priority: High  . Hyperlipidemia associated with type 2 diabetes mellitus ([place]) 02/01/2016    Priority: High  . Overweight (BMI  25.0-29.9) 02/01/2016    Priority: High  . Hypertension associated with diabetes ([place]) 04/26/2013    Priority: High  . GERD (gastroesophageal reflux disease) 02/01/2016    Priority: Medium  . Hypothyroidism 05/18/2013    Priority: Medium  . Type 2 diabetes mellitus with peripheral neuropathy ([place]) 12/14/2000    Priority: Low  . Fever 01/17/2018  . Myalgia 01/17/2018  . Chills 01/17/2018  . Acute otitis media 01/17/2018  . Acute maxillary sinusitis 01/17/2018  .  Abnormal urinalysis 08/09/2017  . Urinary urgency 08/09/2017  . Urinary frequency 08/09/2017  . Allergic rhinitis 08/13/2016  . Esophageal stricture 02/01/2016  . Alternating constipation and diarrhea 05/16/2014  . Preventative health care 12/04/2013  . SVT (supraventricular tachycardia) ([place]) 04/26/2013     Past Medical History:  Diagnosis Date  . Arthritis    "left knee" (06/21/2013)  . SVXBLTJQ(300.9)    "weekly" (06/21/2013)  . Hypertension   . Hypothyroidism   . Pneumonia [phone]   "quit a few times" (06/21/2013)  . Supraventricular tachycardia ([place])   . Type II diabetes mellitus ([place])      Past Surgical History:  Procedure Laterality Date  . CATARACT EXTRACTION Bilateral   . CHOLECYSTECTOMY  ~ 1989  . SUPRAVENTRICULAR TACHYCARDIA ABLATION  06/21/2013  . SUPRAVENTRICULAR TACHYCARDIA ABLATION N/A 06/21/2013   Procedure: SUPRAVENTRICULAR TACHYCARDIA ABLATION;  Surgeon: Evans Lance, MD;  Location: [facility] CATH LAB;  Service: Cardiovascular;  Laterality: N/A;  . TONSILLECTOMY  ~ 1952  . TUBAL LIGATION  1971     Family History  Problem Relation Age of Onset  . Lung cancer Mother   . Alcohol abuse Mother   . Stroke Father   . Diabetes Father   . Alcohol abuse Father   . Heart attack Father   . Diabetes Paternal Uncle   . Stroke Maternal Grandfather   . Cancer Paternal Grandmother 62       cervical  . Diabetes Paternal Grandmother   . Heart attack Paternal Grandfather   . Colon cancer Neg Hx   .  Esophageal cancer Neg Hx   . Stomach cancer Neg Hx      Social History   Substance and Sexual Activity  Drug Use No  ,  Social History   Substance and Sexual Activity  Alcohol Use Yes  . Alcohol/week: 2.4 oz  . Types: 4 Glasses of wine per week  ,  Social History   Tobacco Use  Smoking Status Never Smoker  Smokeless Tobacco Never Used  ,    Current Outpatient Medications on File Prior to Visit  Medication Sig Dispense Refill  . Blood Glucose Monitoring Suppl (BAYER CONTOUR MONITOR) w/Device KIT 1 each by Does not apply route 2 (two) times daily at 10 AM and 5 PM. 1 kit 0  . cetirizine (ZYRTEC) 10 MG tablet TAKE 1 TABLET BY MOUTH EVERY DAY 90 tablet 0  . cholecalciferol (VITAMIN D) 1000 units tablet Take 1,000 Units by mouth daily.    [place] Kitchen glucose blood test strip Use to test blood glucose 2 times daily. Dx: e11.29 100 each 12  . levothyroxine (SYNTHROID, LEVOTHROID) 88 MCG tablet Take 1 tablet by mouth daily at 8 pm.    . metFORMIN (GLUCOPHAGE-XR) 500 MG 24 hr tablet TAKE TWO TABLETS BY MOUTH ONCE DAILY WITH BREAKFAST (Patient taking differently: one tablet daily) 60 tablet 0  . omeprazole (PRILOSEC) 20 MG capsule Take 1 capsule (20 mg total) by mouth daily. 30 capsule 4  . Turmeric 500 MG CAPS Take by mouth.     No current facility-administered medications on file prior to visit.      Allergies  Allergen Reactions  . Eggs Or Egg-Derived Products Diarrhea  . Penicillins Other (See Comments)    Childhood reaction - hemorrhaged     Review of Systems:   General:  Denies fever, chills Optho/Auditory:   Denies visual changes, blurred vision Respiratory:   Denies SOB, cough, wheeze, DIB  Cardiovascular:   Denies chest pain, palpitations,  painful respirations Gastrointestinal:   Denies nausea, vomiting, diarrhea.  Endocrine:     Denies new hot or cold intolerance Musculoskeletal:  Denies joint swelling, gait issues, or new unexplained myalgias/ arthralgias Skin:  Denies  rash, suspicious lesions  Neurological:    Denies dizziness, unexplained weakness, numbness  Psychiatric/Behavioral:   Denies mood changes    Objective:     Blood pressure 132/72, pulse 60, height 5' 5"  (1.651 m), weight 165 lb 6.4 oz (75 kg), SpO2 99 %.  Body mass index is 27.52 kg/m.  General: Well Developed, well nourished, and in no acute distress.  HEENT: Normocephalic, atraumatic, pupils equal round reactive to light, neck supple, No carotid bruits, no JVD Skin: Warm and dry, cap RF less 2 sec Cardiac: Regular rate and rhythm, S1, S2 WNL's, no murmurs rubs or gallops Respiratory: ECTA B/L, Not using accessory muscles, speaking in full sentences. NeuroM-Sk: Ambulates w/o assistance, moves ext * 4 w/o difficulty, sensation grossly intact.  Ext: scant edema b/l lower ext Psych: No HI/SI, judgement and insight good, Euthymic mood. Full Affect.

## 2018-02-10 DIAGNOSIS — H26491 Other secondary cataract, right eye: Secondary | ICD-10-CM | POA: Diagnosis not present

## 2018-02-10 DIAGNOSIS — H524 Presbyopia: Secondary | ICD-10-CM | POA: Diagnosis not present

## 2018-02-10 DIAGNOSIS — H40023 Open angle with borderline findings, high risk, bilateral: Secondary | ICD-10-CM | POA: Diagnosis not present

## 2018-02-10 DIAGNOSIS — H353133 Nonexudative age-related macular degeneration, bilateral, advanced atrophic without subfoveal involvement: Secondary | ICD-10-CM | POA: Diagnosis not present

## 2018-02-10 DIAGNOSIS — Z961 Presence of intraocular lens: Secondary | ICD-10-CM | POA: Diagnosis not present

## 2018-02-21 ENCOUNTER — Other Ambulatory Visit: Payer: Self-pay | Admitting: Internal Medicine

## 2018-02-21 DIAGNOSIS — E042 Nontoxic multinodular goiter: Secondary | ICD-10-CM | POA: Diagnosis not present

## 2018-02-21 DIAGNOSIS — Z79899 Other long term (current) drug therapy: Secondary | ICD-10-CM | POA: Diagnosis not present

## 2018-02-21 DIAGNOSIS — E039 Hypothyroidism, unspecified: Secondary | ICD-10-CM | POA: Diagnosis not present

## 2018-02-21 DIAGNOSIS — E049 Nontoxic goiter, unspecified: Secondary | ICD-10-CM

## 2018-02-21 DIAGNOSIS — R1310 Dysphagia, unspecified: Secondary | ICD-10-CM

## 2018-02-21 DIAGNOSIS — Z7984 Long term (current) use of oral hypoglycemic drugs: Secondary | ICD-10-CM | POA: Diagnosis not present

## 2018-02-21 DIAGNOSIS — E119 Type 2 diabetes mellitus without complications: Secondary | ICD-10-CM | POA: Diagnosis not present

## 2018-02-22 ENCOUNTER — Telehealth: Payer: Self-pay | Admitting: Family Medicine

## 2018-02-22 ENCOUNTER — Other Ambulatory Visit: Payer: Self-pay

## 2018-02-22 DIAGNOSIS — E1142 Type 2 diabetes mellitus with diabetic polyneuropathy: Secondary | ICD-10-CM

## 2018-02-22 MED ORDER — METFORMIN HCL ER 500 MG PO TB24
500.0000 mg | ORAL_TABLET | Freq: Every day | ORAL | 1 refills | Status: DC
Start: 2018-02-22 — End: 2018-07-05

## 2018-02-22 NOTE — Telephone Encounter (Signed)
Medication refill sent to the pharmacy with updated directions per notes in the chart.   Patient notified. MPulliam, CMA/RT(R)

## 2018-02-22 NOTE — Telephone Encounter (Signed)
Patient is requesting a refill of her metformin, the order states 2 tabs/day but has been changed to 1 tab/day according to patient, so if we can get that updated. She would like a 90 day supply sent into [facility] on [address] if approved so that insurance will pay at 100%. Patient states that if you have any questions to contact her at your convenience.

## 2018-02-22 NOTE — Telephone Encounter (Signed)
Refill request for metformin.  Reviewed the chart and patient is currently taking 1 tablet once a day and that is noted in the note form lov on 01/26/2018.  Medication refill sent to pharmacy with updated directions.  MPulliam, CMA/RT(R)

## 2018-02-28 ENCOUNTER — Ambulatory Visit
Admission: RE | Admit: 2018-02-28 | Discharge: 2018-02-28 | Disposition: A | Discharge status: Home / Self Care | Payer: Medicare Other | Source: Ambulatory Visit | Attending: Internal Medicine | Admitting: Internal Medicine

## 2018-02-28 DIAGNOSIS — E010 Iodine-deficiency related diffuse (endemic) goiter: Secondary | ICD-10-CM | POA: Diagnosis not present

## 2018-02-28 DIAGNOSIS — R1310 Dysphagia, unspecified: Secondary | ICD-10-CM

## 2018-02-28 DIAGNOSIS — E042 Nontoxic multinodular goiter: Secondary | ICD-10-CM

## 2018-02-28 DIAGNOSIS — E049 Nontoxic goiter, unspecified: Secondary | ICD-10-CM

## 2018-04-14 ENCOUNTER — Other Ambulatory Visit: Payer: Self-pay | Admitting: Family Medicine

## 2018-04-14 DIAGNOSIS — K219 Gastro-esophageal reflux disease without esophagitis: Secondary | ICD-10-CM

## 2018-04-19 ENCOUNTER — Other Ambulatory Visit: Payer: Self-pay | Admitting: Family Medicine

## 2018-04-19 DIAGNOSIS — J301 Allergic rhinitis due to pollen: Secondary | ICD-10-CM

## 2018-04-29 ENCOUNTER — Other Ambulatory Visit (INDEPENDENT_AMBULATORY_CARE_PROVIDER_SITE_OTHER): Payer: Medicare Other

## 2018-04-29 DIAGNOSIS — E038 Other specified hypothyroidism: Secondary | ICD-10-CM | POA: Diagnosis not present

## 2018-04-29 DIAGNOSIS — I152 Hypertension secondary to endocrine disorders: Secondary | ICD-10-CM

## 2018-04-29 DIAGNOSIS — E1159 Type 2 diabetes mellitus with other circulatory complications: Secondary | ICD-10-CM | POA: Diagnosis not present

## 2018-04-29 DIAGNOSIS — E1129 Type 2 diabetes mellitus with other diabetic kidney complication: Secondary | ICD-10-CM

## 2018-04-29 DIAGNOSIS — E559 Vitamin D deficiency, unspecified: Secondary | ICD-10-CM | POA: Diagnosis not present

## 2018-04-29 DIAGNOSIS — E1142 Type 2 diabetes mellitus with diabetic polyneuropathy: Secondary | ICD-10-CM

## 2018-04-29 DIAGNOSIS — E782 Mixed hyperlipidemia: Secondary | ICD-10-CM | POA: Diagnosis not present

## 2018-04-29 DIAGNOSIS — R809 Proteinuria, unspecified: Secondary | ICD-10-CM

## 2018-04-29 DIAGNOSIS — E1169 Type 2 diabetes mellitus with other specified complication: Secondary | ICD-10-CM | POA: Diagnosis not present

## 2018-04-29 DIAGNOSIS — I10 Essential (primary) hypertension: Secondary | ICD-10-CM | POA: Diagnosis not present

## 2018-04-29 DIAGNOSIS — E785 Hyperlipidemia, unspecified: Secondary | ICD-10-CM | POA: Diagnosis not present

## 2018-04-30 LAB — MICROALBUMIN / CREATININE URINE RATIO
Creatinine, Urine: 150.7 mg/dL
MICROALBUM., U, RANDOM: 12 ug/mL
Microalb/Creat Ratio: 8 mg/g creat (ref 0.0–30.0)

## 2018-05-01 LAB — CBC WITH DIFFERENTIAL/PLATELET
Basophils Absolute: 0 10*3/uL (ref 0.0–0.2)
Basos: 0 %
EOS (ABSOLUTE): 0.2 10*3/uL (ref 0.0–0.4)
EOS: 4 %
HEMATOCRIT: 37.7 % (ref 34.0–46.6)
Hemoglobin: 12.6 g/dL (ref 11.1–15.9)
IMMATURE GRANULOCYTES: 0 %
Immature Grans (Abs): 0 10*3/uL (ref 0.0–0.1)
LYMPHS ABS: 1.1 10*3/uL (ref 0.7–3.1)
Lymphs: 23 %
MCH: 30.9 pg (ref 26.6–33.0)
MCHC: 33.4 g/dL (ref 31.5–35.7)
MCV: 92 fL (ref 79–97)
MONOCYTES: 8 %
Monocytes Absolute: 0.4 10*3/uL (ref 0.1–0.9)
Neutrophils Absolute: 2.9 10*3/uL (ref 1.4–7.0)
Neutrophils: 65 %
Platelets: 167 10*3/uL (ref 150–379)
RBC: 4.08 x10E6/uL (ref 3.77–5.28)
RDW: 14.6 % (ref 12.3–15.4)
WBC: 4.6 10*3/uL (ref 3.4–10.8)

## 2018-05-01 LAB — T4, FREE: Free T4: 1.1 ng/dL (ref 0.82–1.77)

## 2018-05-01 LAB — LIPID PANEL
CHOLESTEROL TOTAL: 196 mg/dL (ref 100–199)
Chol/HDL Ratio: 3.6 ratio (ref 0.0–4.4)
HDL: 54 mg/dL (ref >39)
LDL Calculated: 128 mg/dL — ABNORMAL HIGH (ref 0–99)
Triglycerides: 71 mg/dL (ref 0–149)
VLDL CHOLESTEROL CAL: 14 mg/dL (ref 5–40)

## 2018-05-01 LAB — TSH: TSH: 5.38 u[IU]/mL — ABNORMAL HIGH (ref 0.450–4.500)

## 2018-05-01 LAB — HEMOGLOBIN A1C
ESTIMATED AVERAGE GLUCOSE: 108 mg/dL
Hgb A1c MFr Bld: 5.4 % (ref 4.8–5.6)

## 2018-05-01 LAB — T3, FREE: T3, Free: 2.2 pg/mL (ref 2.0–4.4)

## 2018-05-01 LAB — VITAMIN D 25 HYDROXY (VIT D DEFICIENCY, FRACTURES): Vit D, 25-Hydroxy: 64.5 ng/mL (ref 30.0–100.0)

## 2018-05-06 ENCOUNTER — Ambulatory Visit: Payer: Medicare Other | Admitting: Family Medicine

## 2018-05-25 ENCOUNTER — Encounter: Payer: Self-pay | Admitting: Family Medicine

## 2018-05-25 ENCOUNTER — Ambulatory Visit (INDEPENDENT_AMBULATORY_CARE_PROVIDER_SITE_OTHER): Payer: Medicare Other | Admitting: Family Medicine

## 2018-05-25 VITALS — BP 156/83 | HR 55 | Ht 65.0 in | Wt 159.3 lb

## 2018-05-25 DIAGNOSIS — F430 Acute stress reaction: Secondary | ICD-10-CM

## 2018-05-25 DIAGNOSIS — F5104 Psychophysiologic insomnia: Secondary | ICD-10-CM

## 2018-05-25 DIAGNOSIS — Z8659 Personal history of other mental and behavioral disorders: Secondary | ICD-10-CM

## 2018-05-25 MED ORDER — FLUOXETINE HCL 20 MG PO CAPS: 20.0000 mg | ORAL_CAPSULE | Freq: Every day | ORAL | 1 refills | Status: AC

## 2018-05-25 MED ORDER — ALPRAZOLAM 0.5 MG PO TABS: 0.5000 mg | ORAL_TABLET | ORAL | 0 refills | Status: DC | PRN

## 2018-05-25 MED ORDER — ZOLPIDEM TARTRATE 10 MG PO TABS: 10.0000 mg | ORAL_TABLET | Freq: Every evening | ORAL | 0 refills | Status: DC | PRN

## 2018-05-25 NOTE — Progress Notes (Signed)
Impression and Recommendations:    1. Emotional crisis, acute reaction to stress   2. Psychophysiological insomnia   3. H/O: depression    Pt was in the office today for 45+ minutes, with over 50% time spent in face to face counseling of patients various medical conditions, treatment plans of those medical conditions including medicine management and lifestyle modification, strategies to improve health and well being; and in coordination of care. SEE ABOVE TREATMENT PLAN FOR DETAILS  1. Acute Insomnia - Ambien recommended for short-term relief of insomnia, 3-4 months.  - Patient knows to use it to restore sleep-wake cycles, and discontinue use as recommended.  2. Mood/ emotional crisis due to recent loss of husband - Prozac prescribed today after r/b meds d/c pt.  Advised patient to give herself 6 months on this medication to manage stress before beginning to wean off. - made verbal agreement with pt today and she promised not to hurt self; said she never would due to her kids and also has too much life to live and much to look forward to  - Xanax provided to patient for moments when she feels panicky.  Patient does NOT have suicidal ideations at this time.  States "I have too much conscience to do that."  Notes that she has to survive for her family.  Patient understands to only take this medicine SPARINGLY, when she's feeling so panicked and overwhelmed that she cannot keep the panic under control.  - Patient desires counseling.  List of counselors provided to patient today.  3. EKG for Surgery Prep - EKG ordered today for upcoming surgery patient has planned on July 9.  4. Follow-Up - Encouraged patient to find a doctor to establish with up in [place], ASAP.   Education and routine counseling performed. Handouts provided.  No orders of the defined types were placed in this encounter.   Meds ordered this encounter  Medications  . FLUoxetine (PROZAC) 20 MG capsule   Sig: Take 1 capsule (20 mg total) by mouth daily.    Dispense:  90 capsule    Refill:  1  . ALPRAZolam (XANAX) 0.5 MG tablet    Sig: Take 1 tablet (0.5 mg total) by mouth as needed (only for panic attacks).    Dispense:  30 tablet    Refill:  0  . zolpidem (AMBIEN) 10 MG tablet    Sig: Take 1 tablet (10 mg total) by mouth at bedtime as needed for sleep.    Dispense:  90 tablet    Refill:  0    Gross side effects, risk and benefits, and alternatives of medications and treatment plan in general discussed with patient.  Patient is aware that all medications have potential side effects and we are unable to predict every side effect or drug-drug interaction that may occur.   Patient will call with any questions prior to using medication if they have concerns.  Expresses verbal understanding and consents to current therapy and treatment regimen.  No barriers to understanding were identified.  Red flag symptoms and signs discussed in detail.  Patient expressed understanding regarding what to do in case of emergency\urgent symptoms  Please see AVS handed out to patient at the end of our visit for further patient instructions/ counseling done pertaining to today's office visit.   Return for f/up 4-6 wks.     Note: This document was prepared using Dragon voice recognition software and may include unintentional dictation errors.  This  document serves as a record of services personally performed by Sophia Dance, DO. It was created on her behalf by Sophia Cox, a trained medical scribe. The creation of this record is based on the scribe's personal observations and the provider's statements to them.   I have reviewed the above medical documentation for accuracy and completeness and I concur.  Sophia Cox 05/26/18 5:26 AM   -------------------------------------------------------------------------------------------------    Subjective:    CC:  Chief Complaint  Patient presents  with  . Follow-up    HPI: Sophia Cox is a 72 y.o. female who presents to [place] [facility] today for follow-up of mood.  Patient's husband Sophia Cox died very recently.  The patient has sold her house and is moving back to [place] to be with family.  Notes that her house here sold in 24 hours.  States "when I decide to do something, I do it."  She plans to move in with her daughter for a while, and is meeting with realtors in [place] next week.  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  She [place] the [place] and doesn't want to leave the [place], but notes she needs to be with her family right now.  She feels the need for assistance during this time, medication and counseling, but wants to stay alert because she has so much packing to do and furniture to sell.  "I do not want to be sedated; I have to be clear-headed."  But notes that she's on edge.  Patient wants something to take the edge off.  Insomnia Due to Grief About Husband's Death  States that she was sleeping very well the first couple of weeks after Sophia Cox's death.  States that now, she sits down, doesn't let herself watch TV until it's dark out; then she sits there watching [place].  Says she was watching [name], which was good because "it's dark; it's really nasty."  Notes that [place] of [place] is the most stupid, brainless, funny thing - she loves watching it.  She passes out in front of the TV around 1 o'clock in the morning, then goes to bed and wakes up off and on, and gets out of bed at 6.  She is not sleeping during the day.  Notes she never naps.  Patient has never been on a sleep aid before.  Timeline of Husband's Death  Patient describes the timeline  leading up to her husband's death.   Notes she was having horrible family issues going on with her son and daughter and was up in [place] for a week.  Notes "I wasted a week up there."  Came home on Friday; didn't get home until late.  Had a night with Sophia Cox; notes "he was very cerebral" but does state she made sure to be intimate with him before leaving for [place] the week prior.  Saturday morning, she got up at 5:30 AM, went to [employer]; was at [employer] all day; got home at 6:00.  Sophia Cox poured a glass of wine for himself, she only had 4 oz.  She says he drank the entire bottle of wine aside from 4 oz and was smoking marijuana.  It was  a wonderful night where they had music playing and were dancing, he was reading her poems from Hulan Amato and telling her how much he loved her, talking about how their life was so good.  She made him two corned beef sandwiches and he decided to go to bed.  Around 4:30 in the morning, she heard a gasping noise; she thought he was snoring or going to puke.  She shook him and said "Sophia Cox, you're snoring," and he stopped making sound.  Then she got up to pee and heard nothing from him at all.  Notes she ran out of the bathroom and was pushing him and screaming, trying to make him wake up.  Thought he was kidding with her at first, but then realized he was gone.  She called her neighbors (both nurses) to help.  They could not revive him, and called 911.  Notes that emergency services kept telling her it was too late.  Mood Changes Since Husband's Death  Notes now "I'm in very bad shape."  She states "I can't stop seeing him (the moment where he died).  It's like, we were so freakin' happy.  We had the most perfect freakin' life in the planet.  We hardly ever fought; we were the same person; he loved me like nobody in the world ever loved me.  I loved him so much and now he's gone.  It sucks, it sucks, it sucks, it sucks.  I have always been a very strong woman.  But I just wanna be  gone."  Patient has suffered with depression in the past.  She has had suicidal feelings since her husband's death, but has not come up with a plan for suicide.  Notes she hates knives and has no guns.  States "I would probably drive off a cliff, but I'd have to find one first."  Patient has a plan for her future to survive, for the sake of her children.  States that she's only alive for her children at this point.  She has two sons and a daughter.  Her middle son has been going through electroshock treatments for schizo-bipolar, and has been in and out of the hospital for this.  Her grandson, her daughter's son, is in rehab for drugs.  Notes that her daughter is crumbling due to the stress.  Further Life Stressors  To complicate matters, she and Sophia Cox were having a will made prior to his death, but it was never finalized.  The lawyer they were working with never called her back.  She had been calling this lawyer for a year with no response.  After Sophia Cox's death, she found a new lawyer, [employer], who helped her with lingering issues, including those related to the deed to her house with [place].   Issues with her husband's property and inheritance have not been fully resolved.    Blood Sugar  Notes that her sugars have been good since after the week of Sophia Cox's death, but she's gained weight due to stress.    Depression screen [facility] 2/9 05/25/2018 01/26/2018 01/17/2018  Decreased Interest 1 0 0  Down, Depressed, Hopeless 1 0 0  PHQ - 2 Score 2 0 0  Altered sleeping 2 0 0  Tired, decreased energy 2 0 1  Change in appetite 2 0 0  Feeling bad or failure about yourself  0 0 0  Trouble concentrating 3 0 0  Moving slowly or fidgety/restless 3 0 0  Suicidal thoughts 1 0  0  PHQ-9 Score 15 0 1  Difficult doing work/chores Somewhat difficult Not difficult at all Not difficult at all     No flowsheet data found.   Wt Readings from Last 3 Encounters:  05/25/18 159 lb 4.8 oz (72.3 kg)  01/26/18  165 lb 6.4 oz (75 kg)  01/17/18 164 lb 4.8 oz (74.5 kg)   BP Readings from Last 3 Encounters:  05/25/18 (!) 156/83  01/26/18 132/72  01/17/18 113/71   Pulse Readings from Last 3 Encounters:  05/25/18 (!) 55  01/26/18 60  01/17/18 68   BMI Readings from Last 3 Encounters:  05/25/18 26.51 kg/m  01/26/18 27.52 kg/m  01/17/18 27.34 kg/m         Patient Care Team    Relationship Specialty Notifications Start End  Sophia Dance, DO PCP - General Family Medicine  09/10/16   Evans Lance, MD Consulting Physician Cardiology  09/10/16   Delrae Rend, MD Consulting Physician Endocrinology  09/10/16   Calvert Cantor, MD Consulting Physician Ophthalmology  04/14/17   Hurman Horn, MD Consulting Physician Ophthalmology  04/14/17    Comment: retinal specialist     Patient Active Problem List   Diagnosis Date Noted  . 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 Dyslipidemia with low high density lipoprotein (HDL) cholesterol with hypertriglyceridemia due to type 2 diabetes mellitus ([place]) 04/14/2017    Priority: High  . Controlled diabetes mellitus with microalbuminuria, without long-term current use of insulin ([place]) 09/10/2016    Priority: High  . Patient's noncompliance with other medical treatment and regimen- declines ARB or chol meds 09/10/2016    Priority: High  . Hyperlipidemia associated with type 2 diabetes mellitus ([place]) 02/01/2016    Priority: High  . Overweight (BMI 25.0-29.9) 02/01/2016    Priority: High  . Hypertension associated with diabetes ([place]) 04/26/2013    Priority: High  . GERD (gastroesophageal reflux disease) 02/01/2016    Priority: Medium  . Hypothyroidism 05/18/2013    Priority: Medium  . Type 2 diabetes mellitus with peripheral neuropathy ([place]) 12/14/2000    Priority: Low  . Fever 01/17/2018  . Myalgia 01/17/2018  . Chills 01/17/2018  . Acute otitis media 01/17/2018  . Acute maxillary  sinusitis 01/17/2018  . Abnormal urinalysis 08/09/2017  . Urinary urgency 08/09/2017  . Urinary frequency 08/09/2017  . Allergic rhinitis 08/13/2016  . Esophageal stricture 02/01/2016  . Alternating constipation and diarrhea 05/16/2014  . Preventative health care 12/04/2013  . SVT (supraventricular tachycardia) ([place]) 04/26/2013    Past Medical history, Surgical history, Family history, Social history, Allergies and Medications have been entered into the medical record, reviewed and changed as needed.    Current Meds  Medication Sig  . Blood Glucose Monitoring Suppl (BAYER CONTOUR MONITOR) w/Device KIT 1 each by Does not apply route 2 (two) times daily at 10 AM and 5 PM.  . cetirizine (ZYRTEC) 10 MG tablet TAKE 1 TABLET BY MOUTH EVERY DAY  . cholecalciferol (VITAMIN D) 1000 units tablet Take 1,000 Units by mouth daily.  Marland Kitchen glucose blood test strip Use to test blood glucose 2 times daily. Dx: e11.29  . levothyroxine (SYNTHROID, LEVOTHROID) 88 MCG tablet Take 1 tablet by mouth daily at 8 pm.  . metFORMIN (GLUCOPHAGE-XR) 500 MG 24 hr tablet Take 1 tablet (500 mg total) by mouth daily with breakfast.  . omeprazole (PRILOSEC) 20 MG capsule Take 1 capsule (20 mg total) by mouth daily.  Marland Kitchen  Turmeric 500 MG CAPS Take by mouth.    Allergies:  Allergies  Allergen Reactions  . Eggs Or Egg-Derived Products Diarrhea  . Penicillins Other (See Comments)    Childhood reaction - hemorrhaged     Review of Systems: Review of Systems: General:   No F/C, wt loss Pulm:   No DIB, SOB, pleuritic chest pain Card:  No CP, palpitations Abd:  No n/v/d or pain Ext:  No inc edema from baseline Psych: no SI/ HI    Objective:   Blood pressure (!) 156/83, pulse (!) 55, weight 159 lb 4.8 oz (72.3 kg), SpO2 99 %. Body mass index is 26.51 kg/m. General:  Tearful and visibly emotionally distressed throughout appointment.  Well Developed, well nourished, appropriate for stated age.  Neuro:  Alert and  oriented,  extra-ocular muscles intact  HEENT:  Normocephalic, atraumatic, neck supple, no carotid bruits appreciated  Skin:  no gross rash, warm, pink. Cardiac:  RRR, S1 S2 Respiratory:  ECTA B/L and A/P, Not using accessory muscles, speaking in full sentences- unlabored. Vascular:  Ext warm, no cyanosis apprec.; cap RF less 2 sec. Psych:  No HI/SI, judgement and insight good, Euthymic mood. Full Affect.

## 2018-05-25 NOTE — Patient Instructions (Signed)
  Behavioral Health/ Counseling Referrals    Anderson Malta, personal counselor in [place], specializing in marriage counseling    Dr. Tomi Bamberger, PHD Dr. Tomi Bamberger, PHD is a counselor in [place], [place].  [address] [place], [place] [address] [place] [phone]   Kristie Cowman, [place]  [phone] [email or web address] ( she does [place] and faith-based coaching and counseling )    [employer]- ( faith-based counseling ) Address: [address] [place], [place] [address] [phone] [address] for appointments [phone] Fax Hours: Monday - Thursday 8:00am-6:00pm Closed for lunch 12-1Thursday only Friday: Closed all day   Steffanie Rainwater: [phone] or Meg Martinique- [phone] -counselors in [place] who are faith based    [facility] Nunzio Cobbs, [place], ACSW, M.ED.  -Nunzio Cobbs is a licensed clinical social worker in practice over 2 years and with Dr. Chucky May for the last 10 years.  -She sees adults, adolescents, children & families and couples. -Services are provided for mood and anxiety disorders, marital issues, family or parent/child problems, parenting, co-dependency, gender issues, trauma, grief, and stages of life issues. She also provides critical incident stress debriefing.  -Pamala Hurry accepts many employee assistance programs (EAP), [employer], Pharmacist, hospital.  PHONE  [phone]                FAX [phone]   Rodena [email or web address] [employer]- gen counseling;  PHD   Wilber Oliphant, MSW [address] [place]   [facility] Apolonio Schneiders, PhD [address] [place] [phone]   [facility]- children [address] [place], [place] [phone]   Lanelle Bal Professional Counselor Counseling and [employer] [place] abuse [facility] - [facility] Manager [address] [place] [phone] [place] [address] [place] --->  Delmer Islam, PhD *  -adult, adolescent, and child **   ** Oneida Arenas, PhD Leitha Bleak, LCSW Jillene Bucks, PhD-child, adolescent and adults   [facility] [address], [place] [phone] Merilyn Baba, MS-child, adolescent and adults Lennart Pall, PhD-adolescent and adults   [facility]-grief, terminal illness [place] [address], [facility] [address] [place], [place]   [facility] [address] [place], [place] [phone]   [facility] the [facility] [address] [place] [phone]   [facility] [address], [place]   [facility] [address] [place] [address], [place]

## 2018-05-26 DIAGNOSIS — H353124 Nonexudative age-related macular degeneration, left eye, advanced atrophic with subfoveal involvement: Secondary | ICD-10-CM | POA: Diagnosis not present

## 2018-05-26 DIAGNOSIS — H43813 Vitreous degeneration, bilateral: Secondary | ICD-10-CM | POA: Diagnosis not present

## 2018-05-26 DIAGNOSIS — H353113 Nonexudative age-related macular degeneration, right eye, advanced atrophic without subfoveal involvement: Secondary | ICD-10-CM | POA: Diagnosis not present

## 2018-05-26 DIAGNOSIS — E113291 Type 2 diabetes mellitus with mild nonproliferative diabetic retinopathy without macular edema, right eye: Secondary | ICD-10-CM | POA: Diagnosis not present

## 2018-05-27 ENCOUNTER — Encounter: Payer: Self-pay | Admitting: Family Medicine

## 2018-05-30 ENCOUNTER — Ambulatory Visit: Payer: Medicare Other | Admitting: Family Medicine

## 2018-07-05 ENCOUNTER — Encounter: Payer: Self-pay | Admitting: Family Medicine

## 2018-07-05 ENCOUNTER — Ambulatory Visit (INDEPENDENT_AMBULATORY_CARE_PROVIDER_SITE_OTHER): Payer: Medicare Other | Admitting: Family Medicine

## 2018-07-05 VITALS — BP 154/78 | HR 73 | Ht 65.0 in | Wt 160.4 lb

## 2018-07-05 DIAGNOSIS — E1169 Type 2 diabetes mellitus with other specified complication: Secondary | ICD-10-CM | POA: Diagnosis not present

## 2018-07-05 DIAGNOSIS — E782 Mixed hyperlipidemia: Secondary | ICD-10-CM

## 2018-07-05 DIAGNOSIS — F430 Acute stress reaction: Secondary | ICD-10-CM | POA: Insufficient documentation

## 2018-07-05 DIAGNOSIS — R809 Proteinuria, unspecified: Secondary | ICD-10-CM

## 2018-07-05 DIAGNOSIS — F5104 Psychophysiologic insomnia: Secondary | ICD-10-CM | POA: Diagnosis not present

## 2018-07-05 DIAGNOSIS — E1159 Type 2 diabetes mellitus with other circulatory complications: Secondary | ICD-10-CM

## 2018-07-05 DIAGNOSIS — E1129 Type 2 diabetes mellitus with other diabetic kidney complication: Secondary | ICD-10-CM

## 2018-07-05 DIAGNOSIS — E1142 Type 2 diabetes mellitus with diabetic polyneuropathy: Secondary | ICD-10-CM | POA: Diagnosis not present

## 2018-07-05 DIAGNOSIS — I10 Essential (primary) hypertension: Secondary | ICD-10-CM

## 2018-07-05 MED ORDER — ALPRAZOLAM 0.5 MG PO TABS: 0.5000 mg | ORAL_TABLET | ORAL | 0 refills | Status: AC | PRN

## 2018-07-05 MED ORDER — LEVOTHYROXINE SODIUM 88 MCG PO TABS: 88.0000 ug | ORAL_TABLET | Freq: Every day | ORAL | 0 refills | Status: AC

## 2018-07-05 MED ORDER — ZOLPIDEM TARTRATE 10 MG PO TABS: 10.0000 mg | ORAL_TABLET | Freq: Every evening | ORAL | 0 refills | Status: AC | PRN

## 2018-07-05 MED ORDER — CETIRIZINE HCL 10 MG PO TABS: 10.0000 mg | ORAL_TABLET | Freq: Every day | ORAL | 0 refills | Status: AC

## 2018-07-05 NOTE — Progress Notes (Signed)
Impression and Recommendations:    1. Hypertension associated with diabetes ([place])   2. Controlled type 2 diabetes mellitus with microalbuminuria, without long-term current use of insulin ([place])   3. Type 2 diabetes mellitus with peripheral neuropathy (H[facility])   4. Dyslipidemia with low high density lipoprotein (HDL) cholesterol with hypertriglyceridemia due to type 2 diabetes mellitus (H[facility])   5. Emotional crisis, acute reaction to stress   6. Psychophysiological insomnia     1. Mood & Sleep - Encouraged patient to begin taking Prozac to improve baseline mood.  - Encouraged patient to stop taking Xanax for sleep aid, and to use it only when feeling particularly panicked or emotionally distraught.  - Thoroughly reviewed with patient today that Xanax is meant to be used sparingly.  - Educated patient that Ambien is meant to be used as a sleep aid.  She may continue using this nightly for now in place of the Xanax.  However, reviewed and emphasized that Ambien is indicated for short-term use, intended to restore natural sleep-wake cycles.  - Encouraged patient to discontinue use of Ambien when sleep-wake cycles are restored.  - Reviewed proper sleep hygiene with the patient today and provided patient on alternative sleep aid techniques.  2. Raynaud's Syndrome - Patient with h/o of Raynaud's in her 72's, and patient's daughter with Raynaud's.  3. Diabetes Mellitus - Patient's diabetes well-controlled by diet and lifestyle. - Last A1c two months ago was 5.4, and 5.2 prior to that. - Metformin discontinued today. - Educated patient on the importance of continued prudent lifestyle.  - Counseled patient on pathophysiology of disease and discussed various treatment options, including the critical importance of prudent dietary and lifestyle modifications as first line.  Importance of low carb/ketogenic diet discussed with patient in addition to regular exercise.   - Continue to monitor  blood sugar, checking FBS and postprandial sugars 2 hours after the biggest meal of your day.  Keep log and bring in next OV for my review.   Also, if you ever feel poorly, please check your blood pressure and blood sugar, as one or the other could be the cause of your symptoms.  - Repeat HbA1c check on 10/06/2018.  At last check 04/29/2018 A1c was 5.4, but patient discontinued metformin on 07/05/2018.  4. Hypertension - BP still elevated above goal.   - With history of diabetes, goal reviewed as less than 140/90. - Medication with ARB strongly recommended today.  Patient declined. - Patient thoroughly educated and cautioned at length about the dangers of chronic high blood pressure, especially in a patient with diabetes.  - Ambulatory BP monitoring strongly encouraged. Keep log and bring in next OV.  - Lifestyle changes such as dash diet and engaging in a regular exercise program discussed with patient.  Educational handouts provided.  5. BMI Counseling Explained to patient what BMI refers to, and what it means medically.    Told patient to think about it as a "medical risk stratification measurement" and how increasing BMI is associated with increasing risk/ or worsening state of various diseases such as hypertension, hyperlipidemia, diabetes, premature OA, depression etc.  American Heart Association guidelines for healthy diet, basically Mediterranean diet, and exercise guidelines of 30 minutes 5 days per week or more discussed in detail.  Health counseling performed.  All questions answered.  6. Lifestyle & Preventative Health Maintenance - Advised patient to continue working toward exercising to improve overall mental, physical, and emotional health.    -  Encouraged patient to continue prudent lifestyle.  Engage in daily physical activity, especially a formal/dedicated exercise routine.  Recommended that the patient eventually strive for at least 150 minutes of moderate cardiovascular  activity per week according to guidelines established by the [facility].   - Healthy dietary habits encouraged, including low-carb, and high amounts of lean protein in diet.   - Patient should also consume adequate amounts of water - half of body weight in oz of water per day.  7. Follow-Up - Patient is moving away from the area.  Encouraged patient to continue to establishing care with new PCP in [place], following her prudent lifestyle, and continuing to follow-up for chronic health maintenance.  - Medications refilled today per below.    No orders of the defined types were placed in this encounter.   Meds ordered this encounter  Medications  . ALPRAZolam (XANAX) 0.5 MG tablet    Sig: Take 1 tablet (0.5 mg total) by mouth as needed (only for panic attacks).    Dispense:  30 tablet    Refill:  0  . cetirizine (ZYRTEC) 10 MG tablet    Sig: Take 1 tablet (10 mg total) by mouth daily.    Dispense:  90 tablet    Refill:  0  . levothyroxine (SYNTHROID, LEVOTHROID) 88 MCG tablet    Sig: Take 1 tablet (88 mcg total) by mouth daily at 8 pm.    Dispense:  90 tablet    Refill:  0  . zolpidem (AMBIEN) 10 MG tablet    Sig: Take 1 tablet (10 mg total) by mouth at bedtime as needed for sleep.    Dispense:  90 tablet    Refill:  0    Gross side effects, risk and benefits, and alternatives of medications and treatment plan in general discussed with patient.  Patient is aware that all medications have potential side effects and we are unable to predict every side effect or drug-drug interaction that may occur.   Patient will call with any questions prior to using medication if they have concerns.  Expresses verbal understanding and consents to current therapy and treatment regimen.  No barriers to understanding were identified.  Red flag symptoms and signs discussed in detail.  Patient expressed understanding regarding what to do in case of emergency\urgent symptoms  Please see AVS handed out to  patient at the end of our visit for further patient instructions/ counseling done pertaining to today's office visit.   Return for f/up with new PCP in 3 mo or sooner if BP remains elevated.    Note: This note was prepared with assistance of Dragon voice recognition software. Occasional wrong-word or sound-a-like substitutions may have occurred due to the inherent limitations of voice recognition software.   This document serves as a record of services personally performed by [name], DO. It was created on her behalf by Toni Amend, a trained medical scribe. The creation of this record is based on the scribe's personal observations and the provider's statements to them.   I have reviewed the above medical documentation for accuracy and completeness and I concur.  [name] 07/10/18 9:53 PM   -------------------------------------------------------------------------------------------------------------------------    Subjective:     HPI: Sophia Cox is a 72 y.o. female who presents to [place] at [facility] today for issues as discussed below.  Notes that she's been well, considering.  Patient is literally moving out of her house today.  She's moving the furniture out  of the house today, moving the art out of the house tomorrow, then she hits the road on Thursday morning with her son.  States she's gained weight since her friends keep coming over and feeding her.  Emotionally she feels really good, "and maybe too good."  Notes she has an upcoming appointment with a therapist in [place] on Friday.  She will be up [place] on Thursday.  Her children are all really excited to help her and supportive about her move back.  She has a lot of very good friends up [place] as well.  She also has a new doctor set up; her daughter's GP.  Patient plans to live with her daughter for a while up [place] before moving to a retirement  community.  Mood Patient hasn't taken Prozac.  Notes "I felt like I needed to stay as sharp as I can" and didn't want to "flatline."  She's been using Xanax, but not every day.  Notes she was paranoid of addiction, so she would use it at night to calm herself down while watching [place] and passing out.  She's been using Ambien, not every night.  States "sometimes I'll use a Xanax."  She's used Ambien the last three nights because she "just needs to go to sleep and rest."  Patient feels the Ambien is wonderful because she doesn't wake up constantly during the night.  Instead, she wakes up at 5 or 6 in the morning and feels refreshed.  H/o Raynaud's Syndrome Patient with left ring finger slightly discolored.  Notes that she isn't sure what's happened to it, wondered if it was a nerve impingement.  Notes that she has a history of Raynaud's Syndrome in her twenties, and her daughter currently struggles with Raynaud's "very badly."  Diabetes Notes that her diabetes and blood sugars have been good.  States that friends keep bringing her unhealthy food, but she keeps throwing it out of the house.  Notes that the only time her blood sugar spiked was the week that [name] (her husband) died.  Denies swelling in her legs.    Depression screen [facility] 2/9 07/05/2018 05/25/2018 01/26/2018 01/17/2018 08/09/2017  Decreased Interest 1 1 0 0 0  Down, Depressed, Hopeless 1 1 0 0 0  PHQ - 2 Score 2 2 0 0 0  Altered sleeping 0 2 0 0 -  Tired, decreased energy 0 2 0 1 -  Change in appetite 1 2 0 0 -  Feeling bad or failure about yourself  0 0 0 0 -  Trouble concentrating 3 3 0 0 -  Moving slowly or fidgety/restless 3 3 0 0 -  Suicidal thoughts 0 1 0 0 -  PHQ-9 Score 9 15 0 1 -  Difficult doing work/chores Not difficult at all Somewhat difficult Not difficult at all Not difficult at all -     Wt Readings from Last 3 Encounters:  07/05/18 160 lb 6.4 oz (72.8 kg)  05/25/18 159 lb 4.8 oz (72.3 kg)   01/26/18 165 lb 6.4 oz (75 kg)   BP Readings from Last 3 Encounters:  07/05/18 (!) 154/78  05/25/18 (!) 156/83  01/26/18 132/72   Pulse Readings from Last 3 Encounters:  07/05/18 73  05/25/18 (!) 55  01/26/18 60   BMI Readings from Last 3 Encounters:  07/05/18 26.69 kg/m  05/25/18 26.51 kg/m  01/26/18 27.52 kg/m     Patient Care Team    Relationship Specialty Notifications Start End  [name],  DO PCP - General Family Medicine  09/10/16   Evans Lance, MD Consulting Physician Cardiology  09/10/16   Delrae Rend, MD Consulting Physician Endocrinology  09/10/16   Calvert Cantor, MD Consulting Physician Ophthalmology  04/14/17   Zadie Rhine Clent Demark, MD Consulting Physician Ophthalmology  04/14/17    Comment: retinal specialist     Patient Active Problem List   Diagnosis Date Noted  . 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 Dyslipidemia with low high density lipoprotein (HDL) cholesterol with hypertriglyceridemia due to type 2 diabetes mellitus ([place]) 04/14/2017    Priority: High  . Controlled diabetes mellitus with microalbuminuria, without long-term current use of insulin ([place]) 09/10/2016    Priority: High  . Patient's noncompliance with other medical treatment and regimen- declines ARB or chol meds 09/10/2016    Priority: High  . Hyperlipidemia associated with type 2 diabetes mellitus ([place]) 02/01/2016    Priority: High  . Overweight (BMI 25.0-29.9) 02/01/2016    Priority: High  . Hypertension associated with diabetes ([place]) 04/26/2013    Priority: High  . GERD (gastroesophageal reflux disease) 02/01/2016    Priority: Medium  . Hypothyroidism 05/18/2013    Priority: Medium  . Type 2 diabetes mellitus with peripheral neuropathy ([place]) 12/14/2000    Priority: Low  . Psychophysiological insomnia 07/05/2018  . Emotional crisis, acute reaction to stress 07/05/2018  . Fever 01/17/2018  . Myalgia 01/17/2018   . Chills 01/17/2018  . Acute otitis media 01/17/2018  . Acute maxillary sinusitis 01/17/2018  . Abnormal urinalysis 08/09/2017  . Urinary urgency 08/09/2017  . Urinary frequency 08/09/2017  . Allergic rhinitis 08/13/2016  . Esophageal stricture 02/01/2016  . Alternating constipation and diarrhea 05/16/2014  . Preventative health care 12/04/2013  . SVT (supraventricular tachycardia) ([place]) 04/26/2013    Past Medical history, Surgical history, Family history, Social history, Allergies and Medications have been entered into the medical record, reviewed and changed as needed.    Current Meds  Medication Sig  . ALPRAZolam (XANAX) 0.5 MG tablet Take 1 tablet (0.5 mg total) by mouth as needed (only for panic attacks).  . Blood Glucose Monitoring Suppl (BAYER CONTOUR MONITOR) w/Device KIT 1 each by Does not apply route 2 (two) times daily at 10 AM and 5 PM.  . cetirizine (ZYRTEC) 10 MG tablet Take 1 tablet (10 mg total) by mouth daily.  . cholecalciferol (VITAMIN D) 1000 units tablet Take 1,000 Units by mouth daily.  Marland Kitchen glucose blood test strip Use to test blood glucose 2 times daily. Dx: e11.29  . levothyroxine (SYNTHROID, LEVOTHROID) 88 MCG tablet Take 1 tablet (88 mcg total) by mouth daily at 8 pm.  . Turmeric 500 MG CAPS Take by mouth.  . zolpidem (AMBIEN) 10 MG tablet Take 1 tablet (10 mg total) by mouth at bedtime as needed for sleep.  . [DISCONTINUED] ALPRAZolam (XANAX) 0.5 MG tablet Take 1 tablet (0.5 mg total) by mouth as needed (only for panic attacks).  . [DISCONTINUED] cetirizine (ZYRTEC) 10 MG tablet TAKE 1 TABLET BY MOUTH EVERY DAY  . [DISCONTINUED] levothyroxine (SYNTHROID, LEVOTHROID) 88 MCG tablet Take 1 tablet by mouth daily at 8 pm.  . [DISCONTINUED] metFORMIN (GLUCOPHAGE-XR) 500 MG 24 hr tablet Take 1 tablet (500 mg total) by mouth daily with breakfast.  . [DISCONTINUED] zolpidem (AMBIEN) 10 MG tablet Take 1 tablet (10 mg total) by mouth at bedtime as needed for sleep.     Allergies:  Allergies  Allergen  Reactions  . Eggs Or Egg-Derived Products Diarrhea  . Penicillins Other (See Comments)    Childhood reaction - hemorrhaged    Review of Systems:  A fourteen system review of systems was performed and found to be positive as per HPI.   Objective:   Blood pressure (!) 154/78, pulse 73, height 5' 5"  (1.651 m), weight 160 lb 6.4 oz (72.8 kg), SpO2 98 %. Body mass index is 26.69 kg/m. General:  Well Developed, well nourished, appropriate for stated age.  Neuro:  Alert and oriented,  extra-ocular muscles intact  HEENT:  Normocephalic, atraumatic, neck supple, no carotid bruits appreciated  Skin:  no gross rash, warm, pink. Cardiac:  RRR, S1 S2 Respiratory:  ECTA B/L and A/P, Not using accessory muscles, speaking in full sentences- unlabored. Vascular:  Ext warm, no cyanosis apprec.; cap RF less 2 sec. Psych:  No HI/SI, judgement and insight good, Euthymic mood. Full Affect.

## 2018-07-05 NOTE — Patient Instructions (Addendum)
Sophia Cox it has been an absolute pleasure taking care of you and you will be missed!!  All my best!!      - Please check blood pressure at home, 2-3 times per week.  Write it down in a book and make sure you bring this to your next office visit with your new doctor.  Since were taking off your metformin today, I recommend you get your A1c rechecked 3 months from today.  Please continue to be more vigilant with checking her blood sugars and follow-up sooner than 3 months if you are having concerns.  - Your goal blood pressure is less than 140/90 on a regular basis.   Diabetes Mellitus and Standards of Medical Care  Managing diabetes (diabetes mellitus) can be complicated. Your diabetes treatment may be managed by a team of health care providers, including:  A diet and nutrition specialist (registered dietitian).  A nurse.  A certified diabetes educator (CDE).  A diabetes specialist (endocrinologist).  An eye doctor.  A primary care provider.  A dentist.  Your health care providers follow a schedule in order to help you get the best quality of care. The following schedule is a general guideline for your diabetes management plan. Your health care providers may also give you more specific instructions.  HbA1c (hemoglobin A1c) test This test provides information about blood sugar (glucose) control over the previous 2-3 months. It is used to check whether your diabetes management plan needs to be adjusted.  If you are meeting your treatment goals, this test is done at least 2 times a year.  If you are not meeting treatment goals or if your treatment goals have changed, this test is done 4 times a year.  Blood pressure test  This test is done at every routine medical visit. For most people, the goal is less than 130/80. Ask your health care provider what your goal blood pressure should be.  Dental and eye exams  Visit your dentist two times a year.  If you have type 1 diabetes,  get an eye exam 3-5 years after you are diagnosed, and then once a year after your first exam. ? If you were diagnosed with type 1 diabetes as a child, get an eye exam when you are age 29 or older and have had diabetes for 3-5 years. After the first exam, you should get an eye exam once a year.  If you have type 2 diabetes, have an eye exam as soon as you are diagnosed, and then once a year after your first exam.  Foot care exam  Visual foot exams are done at every routine medical visit. The exams check for cuts, bruises, redness, blisters, sores, or other problems with the feet.  A complete foot exam is done by your health care provider once a year. This exam includes an inspection of the structure and skin of your feet, and a check of the pulses and sensation in your feet. ? Type 1 diabetes: Get your first exam 3-5 years after diagnosis. ? Type 2 diabetes: Get your first exam as soon as you are diagnosed.  Check your feet every day for cuts, bruises, redness, blisters, or sores. If you have any of these or other problems that are not healing, contact your health care provider.  Kidney function test (urine microalbumin)  This test is done once a year. ? Type 1 diabetes: Get your first test 5 years after diagnosis. ? Type 2 diabetes: Get your first test as  soon as you are diagnosed._  If you have chronic kidney disease (CKD), get a serum creatinine and estimated glomerular filtration rate (eGFR) test once a year.  Lipid profile (cholesterol, HDL, LDL, triglycerides)  This test should be done when you are diagnosed with diabetes, and every 5 years after the first test. If you are on medicines to lower your cholesterol, you may need to get this test done every year. ? The goal for LDL is less than 100 mg/dL (5.5 mmol/L). If you are at high risk, the goal is less than 70 mg/dL (3.9 mmol/L). ? The goal for HDL is 40 mg/dL (2.2 mmol/L) for men and 50 mg/dL(2.8 mmol/L) for women. An HDL  cholesterol of 60 mg/dL (3.3 mmol/L) or higher gives some protection against heart disease. ? The goal for triglycerides is less than 150 mg/dL (8.3 mmol/L).  Immunizations  The yearly flu (influenza) vaccine is recommended for everyone 6 months or older who has diabetes.  The pneumonia (pneumococcal) vaccine is recommended for everyone 2 years or older who has diabetes. If you are 45 or older, you may get the pneumonia vaccine as a series of two separate shots.  The hepatitis B vaccine is recommended for adults shortly after they have been diagnosed with diabetes.  The Tdap (tetanus, diphtheria, and pertussis) vaccine should be given: ? According to normal childhood vaccination schedules, for children. ? Every 10 years, for adults who have diabetes.  The shingles vaccine is recommended for people who have had chicken pox and are 50 years or older.  Mental and emotional health  Screening for symptoms of eating disorders, anxiety, and depression is recommended at the time of diagnosis and afterward as needed. If your screening shows that you have symptoms (you have a positive screening result), you may need further evaluation and be referred to a mental health care provider.  Diabetes self-management education  Education about how to manage your diabetes is recommended at diagnosis and ongoing as needed.  Treatment plan  Your treatment plan will be reviewed at every medical visit.  Summary  Managing diabetes (diabetes mellitus) can be complicated. Your diabetes treatment may be managed by a team of health care providers.  Your health care providers follow a schedule in order to help you get the best quality of care.  Standards of care including having regular physical exams, blood tests, blood pressure monitoring, immunizations, screening tests, and education about how to manage your diabetes.  Your health care providers may also give you more specific instructions based on your  individual health.      Type 2 Diabetes Mellitus, Self Care, Adult Caring for yourself after you have been diagnosed with type 2 diabetes (type 2 diabetes mellitus) means keeping your blood sugar (glucose) under control with a balance of:  Nutrition.  Exercise.  Lifestyle changes.  Medicines or insulin, if necessary.  Support from your team of health care providers and others.  The following information explains what you need to know to manage your diabetes at home. What do I need to do to manage my blood glucose?  Check your blood glucose every day, as often as told by your health care provider.  Contact your health care provider if your blood glucose is above your target for 2 tests in a row.  Have your A1c (hemoglobin A1c) level checked at least two times a year, or as often as told by your health care provider. Your health care provider will set individualized treatment goals for  you. Generally, the goal of treatment is to maintain the following blood glucose levels:  Before meals (preprandial): 80-130 mg/dL (4.4-7.2 mmol/L).  After meals (postprandial): below 180 mg/dL (10 mmol/L).  A1c level: less than 7%.  What do I need to know about hyperglycemia and hypoglycemia? What is hyperglycemia? Hyperglycemia, also called high blood glucose, occurs when blood glucose is too high.Make sure you know the early signs of hyperglycemia, such as:  Increased thirst.  Hunger.  Feeling very tired.  Needing to urinate more often than usual.  Blurry vision.  What is hypoglycemia? Hypoglycemia, also called low blood glucose, occurswith a blood glucose level at or below 70 mg/dL (3.9 mmol/L). The risk for hypoglycemia increases during or after exercise, during sleep, during illness, and when skipping meals or not eating for a long time (fasting). It is important to know the symptoms of hypoglycemia and treat it right away. Always have a 15-gram rapid-acting carbohydrate snack  with you to treat low blood glucose. Family members and close friends should also know the symptoms and should understand how to treat hypoglycemia, in case you are not able to treat yourself. What are the symptoms of hypoglycemia? Hypoglycemia symptoms can include:  Hunger.  Anxiety.  Sweating and feeling clammy.  Confusion.  Dizziness or feeling light-headed.  Sleepiness.  Nausea.  Increased heart rate.  Headache.  Blurry vision.  Seizure.  Nightmares.  Tingling or numbness around the mouth, lips, or tongue.  A change in speech.  Decreased ability to concentrate.  A change in coordination.  Restless sleep.  Tremors or shakes.  Fainting.  Irritability.  How do I treat hypoglycemia?  If you are alert and able to swallow safely, follow the 15:15 rule:  Take 15 grams of a rapid-acting carbohydrate. Rapid-acting options include: ? 1 tube of glucose gel. ? 3 glucose pills. ? 6-8 pieces of hard candy. ? 4 oz (120 mL) of fruit juice. ? 4 oz (120 mL) of regular (not diet) soda.  Check your blood glucose 15 minutes after you take the carbohydrate.  If the repeat blood glucose level is still at or below 70 mg/dL (3.9 mmol/L), take 15 grams of a carbohydrate again.  If your blood glucose level does not increase above 70 mg/dL (3.9 mmol/L) after 3 tries, seek emergency medical care.  After your blood glucose level returns to normal, eat a meal or a snack within 1 hour.  How do I treat severe hypoglycemia? Severe hypoglycemia is when your blood glucose level is at or below 54 mg/dL (3 mmol/L). Severe hypoglycemia is an emergency. Do not wait to see if the symptoms will go away. Get medical help right away. Call your local emergency services (911 in the [place] Do not drive yourself to the hospital. If you have severe hypoglycemia and you cannot eat or drink, you may need an injection of glucagon. A family member or close friend should learn how to check your blood  glucose and how to give you a glucagon injection. Ask your health care provider if you need to have an emergency glucagon injection kit available. Severe hypoglycemia may need to be treated in a hospital. The treatment may include getting glucose through an IV tube. You may also need treatment for the cause of your hypoglycemia. Can having diabetes put me at risk for other conditions? Having diabetes can put you at risk for other long-term (chronic) conditions, such as heart disease and kidney disease. Your health care provider may prescribe medicines to help  prevent complications from diabetes. These medicines may include:  Aspirin.  Medicine to lower cholesterol.  Medicine to control blood pressure.  What else can I do to manage my diabetes? Take your diabetes medicines as told  If your health care provider prescribed insulin or diabetes medicines, take them every day.  Do not run out of insulin or other diabetes medicines that you take. Plan ahead so you always have these available.  If you use insulin, adjust your dosage based on how physically active you are and what foods you eat. Your health care provider will tell you how to adjust your dosage. Make healthy food choices  The things that you eat and drink affect your blood glucose and your insulin dosage. Making good choices helps to control your diabetes and prevent other health problems. A healthy meal plan includes eating lean proteins, complex carbohydrates, fresh fruits and vegetables, low-fat dairy products, and healthy fats. Make an appointment to see a diet and nutrition specialist (registered dietitian) to help you create an eating plan that is right for you. Make sure that you:  Follow instructions from your health care provider about eating or drinking restrictions.  Drink enough fluid to keep your urine clear or pale yellow.  Eat healthy snacks between nutritious meals.  Track the carbohydrates that you eat. Do this  by reading food labels and learning the standard serving sizes of foods.  Follow your sick day plan whenever you cannot eat or drink as usual. Make this plan in advance with your health care provider.  Stay active  Exercise regularly, as told by your health care provider. This may include:  Stretching and doing strength exercises, such as yoga or weightlifting, at least 2 times a week.  Doing at least 150 minutes of moderate-intensity or vigorous-intensity exercise each week. This could be brisk walking, biking, or water aerobics. ? Spread out your activity over at least 3 days of the week. ? Do not go more than 2 days in a row without doing some kind of physical activity.  When you start a new exercise or activity, work with your health care provider to adjust your insulin, medicines, or food intake as needed. Make healthy lifestyle choices  Do not use any tobacco products, such as cigarettes, chewing tobacco, and e-cigarettes. If you need help quitting, ask your health care provider.  If your health care provider says that alcohol is safe for you, limit alcohol intake to no more than 1 drink per day for nonpregnant women and 2 drinks per day for men. One drink equals 12 oz of beer, 5 oz of wine, or 1 oz of hard liquor.  Learn to manage stress. If you need help with this, ask your health care provider. Care for your body   Keep your immunizations up to date. In addition to getting vaccinations as told by your health care provider, it is recommended that you get vaccinated against the following illnesses: ? The flu (influenza). Get a flu shot every year. ? Pneumonia. ? Hepatitis B.  Schedule an eye exam soon after your diagnosis, and then one time every year after that.  Check your skin and feet every day for cuts, bruises, redness, blisters, or sores. Schedule a foot exam with your health care provider once every year.  Brush your teeth and gums two times a day, and floss at least  one time a day. Visit your dentist at least once every 6 months.  Maintain a healthy weight. General instructions  Take over-the-counter and prescription medicines only as told by your health care provider.  Share your diabetes management plan with people in your workplace, school, and household.  Check your urine for ketones when you are ill and as told by your health care provider.  Ask your health care provider: ? Do I need to meet with a diabetes educator? ? Where can I find a support group for people with diabetes?  Carry a medical alert card or wear medical alert jewelry.  Keep all follow-up visits as told by your health care provider. This is important. Where to find more information: For more information about diabetes, visit:  American Diabetes Association (ADA): www.diabetes.org  American Association of Diabetes Educators (AADE): www.diabeteseducator.org/patient-resources  This information is not intended to replace advice given to you by your health care provider. Make sure you discuss any questions you have with your health care provider. Document Released: 03/23/2016 Document Revised: 05/07/2016 Document Reviewed: 01/03/2016 Elsevier Interactive Patient Education  2017 [place].      Blood Glucose Monitoring, Adult Monitoring your blood sugar (glucose) helps you manage your diabetes. It also helps you and your health care provider determine how well your diabetes management plan is working. Blood glucose monitoring involves checking your blood glucose as often as directed, and keeping a record (log) of your results over time. Why should I monitor my blood glucose? Checking your blood glucose regularly can:  Help you understand how food, exercise, illnesses, and medicines affect your blood glucose.  Let you know what your blood glucose is at any time. You can quickly tell if you are having low blood glucose (hypoglycemia) or high blood glucose  (hyperglycemia).  Help you and your health care provider adjust your medicines as needed.  When should I check my blood glucose? Follow instructions from your health care provider about how often to check your blood glucose.   This may depend on:  The type of diabetes you have.  How well-controlled your diabetes is.  Medicines you are taking.  If you have type 1 diabetes:  Check your blood glucose at least 2 times a day.  Also check your blood glucose: ? Before every insulin injection. ? Before and after exercise. ? Between meals. ? 2 hours after a meal. ? Occasionally between 2:00 a.m. and 3:00 a.m., as directed. ? Before potentially dangerous tasks, like driving or using heavy machinery. ? At bedtime.  You may need to check your blood glucose more often, up to 6-10 times a day: ? If you use an insulin pump. ? If you need multiple daily injections (MDI). ? If your diabetes is not well-controlled. ? If you are ill. ? If you have a history of severe hypoglycemia. ? If you have a history of not knowing when your blood glucose is getting low (hypoglycemia unawareness).  If you have type 2 diabetes:  If you take insulin or other diabetes medicines, check your blood glucose at least 2 times a day.  If you are on intensive insulin therapy, check your blood glucose at least 4 times a day. Occasionally, you may also need to check between 2:00 a.m. and 3:00 a.m., as directed.  Also check your blood glucose: ? Before and after exercise. ? Before potentially dangerous tasks, like driving or using heavy machinery.  You may need to check your blood glucose more often if: ? Your medicine is being adjusted. ? Your diabetes is not well-controlled. ? You are ill.  What is a blood glucose  log?  A blood glucose log is a record of your blood glucose readings. It helps you and your health care provider: ? Look for patterns in your blood glucose over time. ? Adjust your diabetes  management plan as needed.  Every time you check your blood glucose, write down your result and notes about things that may be affecting your blood glucose, such as your diet and exercise for the day.  Most glucose meters store a record of glucose readings in the meter. Some meters allow you to download your records to a computer. How do I check my blood glucose? Follow these steps to get accurate readings of your blood glucose: Supplies needed   Blood glucose meter.  Test strips for your meter. Each meter has its own strips. You must use the strips that come with your meter.  A needle to prick your finger (lancet). Do not use lancets more than once.  A device that holds the lancet (lancing device).  A journal or log book to write down your results.  Procedure  Wash your hands with soap and water.  Prick the side of your finger (not the tip) with the lancet. Use a different finger each time.  Gently rub the finger until a small drop of blood appears.  Follow instructions that come with your meter for inserting the test strip, applying blood to the strip, and using your blood glucose meter.  Write down your result and any notes.  Alternative testing sites  Some meters allow you to use areas of your body other than your finger (alternative sites) to test your blood.  If you think you may have hypoglycemia, or if you have hypoglycemia unawareness, do not use alternative sites. Use your finger instead.  Alternative sites may not be as accurate as the fingers, because blood flow is slower in these areas. This means that the result you get may be delayed, and it may be different from the result that you would get from your finger.  The most common alternative sites are: ? Forearm. ? Thigh. ? Palm of the hand.  Additional tips  Always keep your supplies with you.  If you have questions or need help, all blood glucose meters have a 24-hour hotline number that you can call.  You may also contact your health care provider.  After you use a few boxes of test strips, adjust (calibrate) your blood glucose meter by following instructions that came with your meter.    The American Diabetes Association suggests the following targets for most nonpregnant adults with diabetes.  More or less stringent glycemic goals may be appropriate for each individual.  A1C: Less than 7% A1C may also be reported as eAG: Less than 154 mg/dl Before a meal (preprandial plasma glucose): 80-130 mg/dl 1-2 hours after beginning of the meal (Postprandial plasma glucose)*: Less than 180 mg/dl  *Postprandial glucose may be targeted if A1C goals are not met despite reaching preprandial glucose goals.   GOALS in short:  The goals are for the Hgb A1C to be less than 7.0 & blood pressure to be less than 130/80.    It is recommended that all diabetics are educated on and follow a healthy diabetic diet, exercise for 30 minutes 3-4 times per week (walking, biking, swimming, or machine), monitor blood glucose readings and bring that record with you to be reviewed at your next office visit.     You should be checking fasting blood sugars- especially after you eat poorly  or eat really healthy, and also check 2 hour postprandial blood sugars after largest meal of the day.    Write these down and bring in your log at each office visit.    You will need to be seen every 3 months by the provider managing your Diabetes unless told otherwise by that provider.   You will need yearly eye exams from an eye specialist and foot exams to check the nerves of your feet.  Also, your urine should be checked yearly as well to make sure excess protein is not present.   If you are checking your blood pressure at home, please record it and bring it to your next office visit.    Follow the Dietary Approaches to Stop Hypertension (DASH) diet (3 servings of fruit and vegetables daily, whole grains, low sodium, low-fat  proteins).  See below.    Lastly, when it comes to your cholesterol, the goal is to have the HDL (good cholesterol) >40, and the LDL (bad cholesterol) <100.   It is recommended that you follow a heart healthy, low saturated and trans-fat diet and exercise for 30 minutes at least 5 times a week.     (( Check out the DASH diet = 1.5 Gram Low Sodium Diet   A 1.5 gram sodium diet restricts the amount of sodium in the diet to no more than 1.5 g or 1500 mg daily.  The American Heart Association recommends Americans over the age of 26 to consume no more than 1500 mg of sodium each day to reduce the risk of developing high blood pressure.  Research also shows that limiting sodium may reduce heart attack and stroke risk.  Many foods contain sodium for flavor and sometimes as a preservative.  When the amount of sodium in a diet needs to be low, it is important to know what to look for when choosing foods and drinks.  The following includes some information and guidelines to help make it easier for you to adapt to a low sodium diet.    QUICK TIPS  Do not add salt to food.  Avoid convenience items and fast food.  Choose unsalted snack foods.  Buy lower sodium products, often labeled as "lower sodium" or "no salt added."  Check food labels to learn how much sodium is in 1 serving.  When eating at a restaurant, ask that your food be prepared with less salt or none, if possible.    READING FOOD LABELS FOR SODIUM INFORMATION  The nutrition facts label is a good place to find how much sodium is in foods. Look for products with no more than 400 mg of sodium per serving.  Remember that 1.5 g = 1500 mg.  The food label may also list foods as:  Sodium-free: Less than 5 mg in a serving.  Very low sodium: 35 mg or less in a serving.  Low-sodium: 140 mg or less in a serving.  Light in sodium: 50% less sodium in a serving. For example, if a food that usually has 300 mg of sodium is changed to become light in  sodium, it will have 150 mg of sodium.  Reduced sodium: 25% less sodium in a serving. For example, if a food that usually has 400 mg of sodium is changed to reduced sodium, it will have 300 mg of sodium.    CHOOSING FOODS  Grains  Avoid: Salted crackers and snack items. Some cereals, including instant hot cereals. Bread stuffing and biscuit mixes. Seasoned  rice or pasta mixes.  Choose: Unsalted snack items. Low-sodium cereals, oats, puffed wheat and rice, shredded wheat. English muffins and bread. Pasta.  Meats  Avoid: Salted, canned, smoked, spiced, pickled meats, including fish and poultry. Bacon, ham, sausage, cold cuts, hot dogs, anchovies.  Choose: Low-sodium canned tuna and salmon. Fresh or frozen meat, poultry, and fish.  Dairy  Avoid: Processed cheese and spreads. Cottage cheese. Buttermilk and condensed milk. Regular cheese.  Choose: Milk. Low-sodium cottage cheese. Yogurt. Sour cream. Low-sodium cheese.  Fruits and Vegetables  Avoid: Regular canned vegetables. Regular canned tomato sauce and paste. Frozen vegetables in sauces. Olives. Angie Fava. Relishes. Sauerkraut.  Choose: Low-sodium canned vegetables. Low-sodium tomato sauce and paste. Frozen or fresh vegetables. Fresh and frozen fruit.  Condiments  Avoid: Canned and packaged gravies. Worcestershire sauce. Tartar sauce. Barbecue sauce. Soy sauce. Steak sauce. Ketchup. Onion, garlic, and table salt. Meat flavorings and tenderizers.  Choose: Fresh and dried herbs and spices. Low-sodium varieties of mustard and ketchup. Lemon juice. Tabasco sauce. Horseradish.    SAMPLE 1.5 GRAM SODIUM MEAL PLAN:   Breakfast / Sodium (mg)  1 cup low-fat milk / 143 mg  1 whole-wheat English muffin / 240 mg  1 tbs heart-healthy margarine / 153 mg  1 hard-boiled egg / 139 mg  1 small orange / 0 mg  Lunch / Sodium (mg)  1 cup raw carrots / 76 mg  2 tbs no salt added peanut butter / 5 mg  2 slices whole-wheat bread / 270 mg  1 tbs jelly / 6 mg    cup red grapes / 2 mg  Dinner / Sodium (mg)  1 cup whole-wheat pasta / 2 mg  1 cup low-sodium tomato sauce / 73 mg  3 oz lean ground beef / 57 mg  1 small side salad (1 cup raw spinach leaves,  cup cucumber,  cup yellow bell pepper) with 1 tsp olive oil and 1 tsp red wine vinegar / 25 mg  Snack / Sodium (mg)  1 container low-fat vanilla yogurt / 107 mg  3 graham cracker squares / 127 mg  Nutrient Analysis  Calories: 1745  Protein: 75 g  Carbohydrate: 237 g  Fat: 57 g  Sodium: 1425 mg  Document Released: 11/30/2005 Document Revised: 08/12/2011 Document Reviewed: 03/03/2010  ExitCare Patient Information 2012 [place].))    This information is not intended to replace advice given to you by your health care provider. Make sure you discuss any questions you have with your health care provider. Document Released: 12/03/2003 Document Revised: 06/19/2016 Document Reviewed: 05/11/2016 Elsevier Interactive Patient Education  2017 [employer].
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
## 2018-07-16 ENCOUNTER — Other Ambulatory Visit: Payer: Self-pay | Admitting: Family Medicine

## 2018-07-16 DIAGNOSIS — K219 Gastro-esophageal reflux disease without esophagitis: Secondary | ICD-10-CM

## 2018-08-29 ENCOUNTER — Telehealth: Payer: Self-pay | Admitting: Family Medicine

## 2018-08-29 NOTE — Telephone Encounter (Signed)
Patient would like call back from nurse about Ambien prescription, she has since moved to [facility] and the prescription was sent into the pharmacy here. She was advised by [facility] to call our office.

## 2018-08-30 NOTE — Telephone Encounter (Signed)
Notified the patient that per Dr. Raliegh Scarlet she can not write [place] medication in another [place]. MPulliam, CMA/RT(R)

## 2018-11-16 ENCOUNTER — Other Ambulatory Visit: Payer: Self-pay | Admitting: Family Medicine

## 2018-12-15 DIAGNOSIS — E559 Vitamin D deficiency, unspecified: Secondary | ICD-10-CM | POA: Diagnosis not present

## 2018-12-15 DIAGNOSIS — E538 Deficiency of other specified B group vitamins: Secondary | ICD-10-CM | POA: Diagnosis not present

## 2018-12-15 DIAGNOSIS — E785 Hyperlipidemia, unspecified: Secondary | ICD-10-CM | POA: Diagnosis not present

## 2018-12-15 DIAGNOSIS — E039 Hypothyroidism, unspecified: Secondary | ICD-10-CM | POA: Diagnosis not present

## 2018-12-15 DIAGNOSIS — Z0001 Encounter for general adult medical examination with abnormal findings: Secondary | ICD-10-CM | POA: Diagnosis not present

## 2018-12-15 DIAGNOSIS — R5382 Chronic fatigue, unspecified: Secondary | ICD-10-CM | POA: Diagnosis not present

## 2018-12-15 DIAGNOSIS — R350 Frequency of micturition: Secondary | ICD-10-CM | POA: Diagnosis not present

## 2018-12-15 DIAGNOSIS — Z6825 Body mass index (BMI) 25.0-25.9, adult: Secondary | ICD-10-CM | POA: Diagnosis not present

## 2018-12-15 DIAGNOSIS — N390 Urinary tract infection, site not specified: Secondary | ICD-10-CM | POA: Diagnosis not present

## 2018-12-15 DIAGNOSIS — G4700 Insomnia, unspecified: Secondary | ICD-10-CM | POA: Diagnosis not present

## 2018-12-15 DIAGNOSIS — Z79899 Other long term (current) drug therapy: Secondary | ICD-10-CM | POA: Diagnosis not present

## 2018-12-15 DIAGNOSIS — E119 Type 2 diabetes mellitus without complications: Secondary | ICD-10-CM | POA: Diagnosis not present

## 2018-12-30 DIAGNOSIS — N390 Urinary tract infection, site not specified: Secondary | ICD-10-CM | POA: Diagnosis not present

## 2018-12-30 DIAGNOSIS — R5382 Chronic fatigue, unspecified: Secondary | ICD-10-CM | POA: Diagnosis not present

## 2018-12-30 DIAGNOSIS — R319 Hematuria, unspecified: Secondary | ICD-10-CM | POA: Diagnosis not present

## 2019-01-04 DIAGNOSIS — J069 Acute upper respiratory infection, unspecified: Secondary | ICD-10-CM | POA: Diagnosis not present

## 2019-01-26 DIAGNOSIS — Z01818 Encounter for other preprocedural examination: Secondary | ICD-10-CM | POA: Diagnosis not present

## 2019-01-26 DIAGNOSIS — Z01812 Encounter for preprocedural laboratory examination: Secondary | ICD-10-CM | POA: Diagnosis not present

## 2019-02-20 DIAGNOSIS — E039 Hypothyroidism, unspecified: Secondary | ICD-10-CM | POA: Diagnosis not present

## 2019-02-20 DIAGNOSIS — Z6826 Body mass index (BMI) 26.0-26.9, adult: Secondary | ICD-10-CM | POA: Diagnosis not present

## 2019-02-20 DIAGNOSIS — Z01818 Encounter for other preprocedural examination: Secondary | ICD-10-CM | POA: Diagnosis not present

## 2019-02-20 DIAGNOSIS — G4700 Insomnia, unspecified: Secondary | ICD-10-CM | POA: Diagnosis not present

## 2019-02-20 DIAGNOSIS — R001 Bradycardia, unspecified: Secondary | ICD-10-CM | POA: Diagnosis not present

## 2019-03-20 DIAGNOSIS — R5383 Other fatigue: Secondary | ICD-10-CM | POA: Diagnosis not present

## 2019-03-20 DIAGNOSIS — E039 Hypothyroidism, unspecified: Secondary | ICD-10-CM | POA: Diagnosis not present

## 2019-03-20 DIAGNOSIS — R350 Frequency of micturition: Secondary | ICD-10-CM | POA: Diagnosis not present

## 2019-03-20 DIAGNOSIS — E119 Type 2 diabetes mellitus without complications: Secondary | ICD-10-CM | POA: Diagnosis not present

## 2023-10-26 ENCOUNTER — Encounter: Payer: Self-pay | Admitting: Internal Medicine
